# Patient Record
Sex: Female | Born: 1935 | Race: White | Hispanic: No | Smoking: Never smoker
Health system: Southern US, Community
[De-identification: ages and names within clinical notes are randomized; demographics above are authoritative.]

## PROBLEM LIST (undated history)

## (undated) DIAGNOSIS — I672 Cerebral atherosclerosis: Secondary | ICD-10-CM

## (undated) DIAGNOSIS — K219 Gastro-esophageal reflux disease without esophagitis: Secondary | ICD-10-CM

## (undated) DIAGNOSIS — I10 Essential (primary) hypertension: Secondary | ICD-10-CM

## (undated) DIAGNOSIS — I639 Cerebral infarction, unspecified: Secondary | ICD-10-CM

## (undated) DIAGNOSIS — E7800 Pure hypercholesterolemia, unspecified: Secondary | ICD-10-CM

## (undated) DIAGNOSIS — C801 Malignant (primary) neoplasm, unspecified: Secondary | ICD-10-CM

## (undated) DIAGNOSIS — K56609 Unspecified intestinal obstruction, unspecified as to partial versus complete obstruction: Secondary | ICD-10-CM

## (undated) DIAGNOSIS — K5792 Diverticulitis of intestine, part unspecified, without perforation or abscess without bleeding: Secondary | ICD-10-CM

## (undated) DIAGNOSIS — N6009 Solitary cyst of unspecified breast: Secondary | ICD-10-CM

## (undated) DIAGNOSIS — M1990 Unspecified osteoarthritis, unspecified site: Secondary | ICD-10-CM

## (undated) DIAGNOSIS — B029 Zoster without complications: Secondary | ICD-10-CM

## (undated) DIAGNOSIS — D0510 Intraductal carcinoma in situ of unspecified breast: Principal | ICD-10-CM

## (undated) DIAGNOSIS — I519 Heart disease, unspecified: Secondary | ICD-10-CM

## (undated) DIAGNOSIS — E119 Type 2 diabetes mellitus without complications: Secondary | ICD-10-CM

## (undated) DIAGNOSIS — Z853 Personal history of malignant neoplasm of breast: Secondary | ICD-10-CM

## (undated) DIAGNOSIS — I2510 Atherosclerotic heart disease of native coronary artery without angina pectoris: Secondary | ICD-10-CM

## (undated) DIAGNOSIS — G43909 Migraine, unspecified, not intractable, without status migrainosus: Secondary | ICD-10-CM

## (undated) DIAGNOSIS — IMO0002 Reserved for concepts with insufficient information to code with codable children: Secondary | ICD-10-CM

## (undated) HISTORY — DX: Cerebral infarction, unspecified: I63.9

## (undated) HISTORY — DX: Reserved for concepts with insufficient information to code with codable children: IMO0002

## (undated) HISTORY — DX: Personal history of malignant neoplasm of breast: Z85.3

## (undated) HISTORY — DX: Intraductal carcinoma in situ of unspecified breast: D05.10

## (undated) HISTORY — DX: Migraine, unspecified, not intractable, without status migrainosus: G43.909

## (undated) HISTORY — DX: Essential (primary) hypertension: I10

## (undated) HISTORY — DX: Unspecified osteoarthritis, unspecified site: M19.90

## (undated) HISTORY — DX: Heart disease, unspecified: I51.9

## (undated) HISTORY — DX: Atherosclerotic heart disease of native coronary artery without angina pectoris: I25.10

## (undated) HISTORY — DX: Gastro-esophageal reflux disease without esophagitis: K21.9

## (undated) HISTORY — PX: KNEE ARTHROSCOPY: SHX127

## (undated) HISTORY — PX: LAPAROSCOPIC BILATERAL SALPINGO OOPHERECTOMY: SHX5890

## (undated) HISTORY — DX: Unspecified intestinal obstruction, unspecified as to partial versus complete obstruction: K56.609

## (undated) HISTORY — PX: OTHER SURGICAL HISTORY: SHX169

## (undated) HISTORY — DX: Pure hypercholesterolemia, unspecified: E78.00

## (undated) HISTORY — DX: Solitary cyst of unspecified breast: N60.09

## (undated) HISTORY — DX: Zoster without complications: B02.9

## (undated) HISTORY — PX: SPINE SURGERY: SHX786

## (undated) HISTORY — PX: BRAIN SURGERY: SHX531

## (undated) HISTORY — DX: Diverticulitis of intestine, part unspecified, without perforation or abscess without bleeding: K57.92

## (undated) HISTORY — DX: Cerebral atherosclerosis: I67.2

## (undated) HISTORY — PX: CYSTECTOMY: SUR359

---

## 2002-03-22 DIAGNOSIS — I639 Cerebral infarction, unspecified: Secondary | ICD-10-CM

## 2002-03-22 HISTORY — DX: Cerebral infarction, unspecified: I63.9

## 2002-06-04 ENCOUNTER — Encounter: Payer: Self-pay | Admitting: Pediatrics

## 2002-06-04 ENCOUNTER — Emergency Department (HOSPITAL_COMMUNITY): Admission: EM | Admit: 2002-06-04 | Discharge: 2002-06-04 | Payer: Self-pay | Admitting: Family Medicine

## 2002-06-04 ENCOUNTER — Encounter: Payer: Self-pay | Admitting: Emergency Medicine

## 2002-06-04 ENCOUNTER — Inpatient Hospital Stay (HOSPITAL_COMMUNITY): Admission: EM | Admit: 2002-06-04 | Discharge: 2002-06-06 | Payer: Self-pay | Admitting: Emergency Medicine

## 2002-06-11 ENCOUNTER — Encounter (HOSPITAL_COMMUNITY): Admission: RE | Admit: 2002-06-11 | Discharge: 2002-07-11 | Payer: Self-pay | Admitting: Pediatrics

## 2002-06-12 ENCOUNTER — Encounter: Payer: Self-pay | Admitting: Emergency Medicine

## 2002-06-12 ENCOUNTER — Emergency Department (HOSPITAL_COMMUNITY): Admission: EM | Admit: 2002-06-12 | Discharge: 2002-06-12 | Payer: Self-pay | Admitting: Emergency Medicine

## 2002-12-23 ENCOUNTER — Encounter: Payer: Self-pay | Admitting: Emergency Medicine

## 2002-12-23 ENCOUNTER — Emergency Department (HOSPITAL_COMMUNITY): Admission: EM | Admit: 2002-12-23 | Discharge: 2002-12-23 | Payer: Self-pay | Admitting: Emergency Medicine

## 2003-02-18 ENCOUNTER — Ambulatory Visit (HOSPITAL_COMMUNITY): Admission: RE | Admit: 2003-02-18 | Discharge: 2003-02-18 | Payer: Self-pay | Admitting: Ophthalmology

## 2003-04-29 ENCOUNTER — Ambulatory Visit (HOSPITAL_COMMUNITY): Admission: RE | Admit: 2003-04-29 | Discharge: 2003-04-29 | Payer: Self-pay | Admitting: Ophthalmology

## 2003-07-18 ENCOUNTER — Ambulatory Visit (HOSPITAL_COMMUNITY): Admission: RE | Admit: 2003-07-18 | Discharge: 2003-07-18 | Payer: Self-pay | Admitting: Pulmonary Disease

## 2003-08-26 ENCOUNTER — Ambulatory Visit (HOSPITAL_COMMUNITY): Admission: RE | Admit: 2003-08-26 | Discharge: 2003-08-26 | Payer: Self-pay | Admitting: Internal Medicine

## 2004-05-09 ENCOUNTER — Emergency Department (HOSPITAL_COMMUNITY): Admission: EM | Admit: 2004-05-09 | Discharge: 2004-05-09 | Payer: Self-pay | Admitting: Emergency Medicine

## 2005-04-21 ENCOUNTER — Emergency Department (HOSPITAL_COMMUNITY): Admission: EM | Admit: 2005-04-21 | Discharge: 2005-04-21 | Payer: Self-pay | Admitting: Emergency Medicine

## 2005-08-29 ENCOUNTER — Emergency Department (HOSPITAL_COMMUNITY): Admission: EM | Admit: 2005-08-29 | Discharge: 2005-08-29 | Payer: Self-pay | Admitting: Emergency Medicine

## 2007-08-16 ENCOUNTER — Ambulatory Visit (HOSPITAL_COMMUNITY): Admission: RE | Admit: 2007-08-16 | Discharge: 2007-08-16 | Payer: Self-pay | Admitting: Pulmonary Disease

## 2009-01-01 ENCOUNTER — Inpatient Hospital Stay (HOSPITAL_COMMUNITY): Admission: EM | Admit: 2009-01-01 | Discharge: 2009-01-04 | Payer: Self-pay | Admitting: Emergency Medicine

## 2009-03-26 ENCOUNTER — Ambulatory Visit (HOSPITAL_COMMUNITY): Admission: RE | Admit: 2009-03-26 | Discharge: 2009-03-26 | Payer: Self-pay | Admitting: Pulmonary Disease

## 2010-03-22 DIAGNOSIS — D0510 Intraductal carcinoma in situ of unspecified breast: Secondary | ICD-10-CM

## 2010-03-22 HISTORY — DX: Intraductal carcinoma in situ of unspecified breast: D05.10

## 2010-06-25 LAB — DIFFERENTIAL
Basophils Absolute: 0 10*3/uL (ref 0.0–0.1)
Basophils Relative: 0 % (ref 0–1)
Eosinophils Absolute: 0 10*3/uL (ref 0.0–0.7)
Eosinophils Relative: 0 % (ref 0–5)
Lymphocytes Relative: 5 % — ABNORMAL LOW (ref 12–46)
Monocytes Relative: 8 % (ref 3–12)

## 2010-06-25 LAB — URINE CULTURE: Colony Count: 25000

## 2010-06-25 LAB — URINALYSIS, ROUTINE W REFLEX MICROSCOPIC
Bilirubin Urine: NEGATIVE
Glucose, UA: NEGATIVE mg/dL
Nitrite: NEGATIVE
Specific Gravity, Urine: 1.02 (ref 1.005–1.030)
Urobilinogen, UA: 0.2 mg/dL (ref 0.0–1.0)

## 2010-06-25 LAB — CBC
HCT: 40.4 % (ref 36.0–46.0)
Hemoglobin: 14 g/dL (ref 12.0–15.0)
MCHC: 34.6 g/dL (ref 30.0–36.0)
RBC: 4.78 MIL/uL (ref 3.87–5.11)
RDW: 14 % (ref 11.5–15.5)
WBC: 15.1 10*3/uL — ABNORMAL HIGH (ref 4.0–10.5)

## 2010-06-25 LAB — COMPREHENSIVE METABOLIC PANEL
ALT: 22 U/L (ref 0–35)
AST: 33 U/L (ref 0–37)
Albumin: 4 g/dL (ref 3.5–5.2)
Alkaline Phosphatase: 84 U/L (ref 39–117)
BUN: 23 mg/dL (ref 6–23)
Calcium: 9.3 mg/dL (ref 8.4–10.5)
Potassium: 3.8 mEq/L (ref 3.5–5.1)
Total Bilirubin: 1.2 mg/dL (ref 0.3–1.2)
Total Protein: 6.5 g/dL (ref 6.0–8.3)

## 2010-06-25 LAB — LIPASE, BLOOD: Lipase: 16 U/L (ref 11–59)

## 2010-06-25 LAB — POCT CARDIAC MARKERS: Troponin i, poc: <0.05 ng/mL (ref 0.00–0.09)

## 2010-08-05 ENCOUNTER — Other Ambulatory Visit (HOSPITAL_COMMUNITY): Payer: Self-pay | Admitting: Pulmonary Disease

## 2010-08-05 DIAGNOSIS — N6452 Nipple discharge: Secondary | ICD-10-CM

## 2010-08-07 NOTE — Discharge Summary (Signed)
Caroline Aguirre, Caroline Aguirre                        ACCOUNT NO.:  [id number]   MEDICAL RECORD NO.:  [id number]                   PATIENT TYPE:  INP   LOCATION:  3025                                 FACILITY:  [facility]   PHYSICIAN:  Deanna Artis. Sharene Skeans, M.D.           DATE OF BIRTH:  [birth year]   DATE OF ADMISSION:  06/04/2002  DATE OF DISCHARGE:  06/06/2002                                 DISCHARGE SUMMARY   FINAL DIAGNOSES:  1. 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  2. Hypertension, initially in poor control, now well controlled, 404.10.  3. Residual stroke involving left partial hemianopia, more in the inferior     quadrants than the superior quadrant.  4. Dyslipidemia.  5. Glaucoma.   PROCEDURE:  CT, MRI, 2-D echocardiogram, carotid Doppler.   COMPLICATIONS:  None.   HOSPITAL COURSE:  The patient was admitted. CT scan at [facility] showed  evidence of hemorrhage that was 4.2 cm x 2.5 cm x 3.5 cm with some  surrounding edema. MRI of the brain again showed the same lesion with a  metabolized hematoma undergoing metabolic change and a surround of vasogenic  edema that was small and was not associated with significant mass effect.   The MRA showed a hypoplastic left segment, no occlusions within the  intracranial vascular were noted. Carotid Doppler study was normal and did  not show hemodynamically significant stenosis. Two-D echocardiogram is  pending at the time of this dictation; however, the patient has shown a  regular sinus rhythm in the hospital and likelihood of cardiac source  embolus is small. The EKG suggested possible anterior infarct of  undetermined age. The patient has not show signs of progression of her  symptoms since that time.   Her laboratory studies include cholesterol of 152, triglycerides 99, HDL  cholesterol slightly low at 38, VLDL cholesterol 20, LDL cholesterol 94.  Prothrombin time 13.4, INR 1.0. Sodium 142,  potassium 3.9, chloride 108, CO2  28, glucose 137, BUN 12, creatinine 0.7, calcium 8.5. Total protein 6.3,  albumin 3.5, albumin 3.5, AST 27, ALT 23, ALP 68, total bilirubin 0.8. Serum  homocysteine is pending at this time.   PHYSICAL EXAMINATION:  VITAL SIGNS:  On examination today, blood pressure  172/77, resting pulse 66, resting respiration 20, pulse oximetry 95%,  temperature 98.6.  HEAD, EYES, EARS, NOSE AND THROAT:  No signs of infection.  NECK:  Supple.  LUNGS:  Clear.  HEART:  No murmurs. Pulses normal.  ABDOMEN:  Soft, nontender, bowel sounds normal.  EXTREMITIES:  Well formed.  NEUROLOGICAL:  The patient has a rather dense left inferior quadrant and  partial left superior quadrant. She has decreased vision from her glaucoma.  Pupils are sluggishly reactive. Fundi are hard to see. The patient has no  drift, normal strength, good fine motor movements, normal sensation. Gait is  slightly broad based, a bit  unsteady, but she does not fall. Deep tendon  reflexes are symmetric and diminished. The patient has bilateral extensor  plantar responses.   DISPOSITION:  The patient is discharged in improved condition. She continues  to have a headache for which she can take Tylenol. We will have her seen by  the occupational therapist one more time to be certain that there is no need  for outpatient OT. OT, PT, and speech therapy have seen her and feel that  she has no needs at this time. She will be followed up in our office in one  month's time, sooner depending upon clinical needs.                                               Deanna Artis. Sharene Skeans, M.D.    [facility]  D:  06/06/2002  T:  06/07/2002  Job:  [id number]   cc:   Ramon Dredge L. Juanetta Gosling, M.D.  [address]  [place]  [place] [address]  Fax: [phone]

## 2010-08-07 NOTE — Consult Note (Signed)
Caroline Aguirre, Caroline Aguirre                        ACCOUNT NO.:  [id number]   MEDICAL RECORD NO.:  [id number]                   PATIENT TYPE:  INP   LOCATION:  3025                                 FACILITY:  [facility]   PHYSICIAN:  Deanna Artis. Sharene Skeans, M.D.           DATE OF BIRTH:  1924/05/25   DATE OF CONSULTATION:  06/04/2002  DATE OF DISCHARGE:                                   CONSULTATION   CHIEF COMPLAINT:  Headache of less than one day's duration, progressive,  involving the front of the head and the right neck.   HISTORY OF PRESENT ILLNESS:  The patient is a 77 year old right-handed  Caucasian divorced woman with past history of hypertension, dyslipidemia,  glaucoma, and cataracts who has had a one-week history of malaise and  generalized myalgias for which she takes Feldene.   The patient had onset of headaches around 2 p.m. while she was fixing her  son's car.  The headache came on gradually and progressed over the course of  the night to involve very severe headache involving the frontal portion of  her head radiating down to the right side of her posterior head region and  neck.  This was associated with blurred vision and nausea.  The patient did  not have other symptoms of weakness, language disorder, sensory changes.   The patient was brought to [facility] where she was assessed by Dr.  Greta Doom.  CT scan of the brain showed a 4.2 cm x 2.5 cm x 3.5 cm  hematoma in the right temporoparietal region posterior division of the  middle cerebral artery.  There was some edema surrounding the lesion.  There  was no clearcut blood in the right occipital horn; however, it appeared to  be obliterated by the bleed.  It could be seen on several levels extending  up to the parietal region sparing the occipital cortex.   I was asked to take the patient in transfer for further evaluation and  treatment.  The patient's blood pressure on presentation was 205/72 and  dropped to  a more normal region after morphine 8 mg and clonidine 0.1 mg was  given.   PAST MEDICAL HISTORY:  Positive for sinusitis and lumbar spondylosis.   PAST SURGICAL HISTORY:  1. Lumbar laminectomy.  2. Hysterectomy.  3. Esophageal dilatation secondary to stricture.   REVIEW OF SYSTEMS:  Positive for paroxysmal nocturnal dyspnea, dysphagia,  urinary frequency, generalized weakness.  A 12-system review is without  negative.   CURRENT MEDICATIONS:  1. Lopressor 50 mg per day.  2. Aspirin 81 mg per day.  3. Tums one per day.  4. Vitamin D one per day.  5. Xalatan one drop in each eye daily.  6. P.r.n. Feldene and Tylenol.   DRUG ALLERGIES:  CODEINE.   FAMILY HISTORY:  Mother died of heart and kidney failure.  Father died of  cancer.  One of her children  died of reasons unknown.  A second is bipolar  and lives with her.   SOCIAL HISTORY:  The patient lives in [place] with her son.  She is divorced,  she drives.  She is independent in her activities of daily living.  She has  had no use of tobacco or alcohol.   PHYSICAL EXAMINATION TODAY:  GENERAL:  Pleasant woman, no acute distress,  sitting up on her stretcher.  She is right-handed.  VITAL SIGNS:  Temperature 97, blood pressure 158/71, resting pulse 51,  respirations 20, pulse oximetry 99% on 2 liters of oxygen.  The patient is  somewhat sleepy but easily arousable.  HEENT:  Supple neck.  No infection, no tenderness, no bruits.  LUNGS:  Clear.  HEART:  No murmurs, pulse normal.  ABDOMEN:  Soft, bowel sounds normal.  No hepatosplenomegaly.  EXTREMITIES:  No edema, cyanosis, or altered tone.  NEUROLOGIC:  Mental status:  The patient is awake, alert.  No dysphagia,  dyspraxia, dysmetria.  She has normal fund of knowledge and memory.  Cranial  nerves:  Round reactive pupils, 2.5 down to 2.  I cannot see in her right  due to a cataract.  The left shows mild optic atrophy.  She has a sharp disk  margin.  Visual fields show left  superior quadrantanopsia.  She cannot  follow directions to test double simultaneous stimuli.  She had symmetric  facial strength, midline tongue.  Motor examination:  Normal strength, tone,  and mass.  Good fine motor movements.  No drift.  Sensory examination:  Withdrawal x4.  Deep tendon reflexes are diminished.  The patient had  bilateral extensor plantar responses.   IMPRESSION:  1. Posterior temporoparietal water shed primary hemorrhage, 431, versus an     ischemic stroke with secondary transformation.  I suspect amyloid     angiopathy.  2. Hypertension, 404.10.  3. Dyslipidemia.  4. Glaucoma.   PLAN:  1. MRI/MRA.  2. A 2-D echocardiogram.  3. Carotid Doppler.  4. PT and OT.  5. Stop aspirin for now and also hold Lopressor for now.   I appreciate the opportunity to participate in her care.                                               Deanna Artis. Sharene Skeans, M.D.   [facility]  D:  06/04/2002  T:  06/04/2002  Job:  [id number]   cc:   Ramon Dredge L. Juanetta Gosling, M.D.  [address]  [place]  [place] [address]  Fax: [phone]

## 2010-08-07 NOTE — Op Note (Signed)
NAMENATALYE, KOTT                        ACCOUNT NO.:  [id number]   MEDICAL RECORD NO.:  [id number]                   PATIENT TYPE:  AMB   LOCATION:  DAY                                  FACILITY:  [facility]   PHYSICIAN:  Lionel December, M.D.                 DATE OF BIRTH:  01/23/1925   DATE OF PROCEDURE:  08/26/2003  DATE OF DISCHARGE:                                 OPERATIVE REPORT   ADDENDUM:  Mrs. Schul agreed to have a barium enema.  She did well.  I  reviewed the films with Dr. __________.  She has a very tortuous sigmoid  colon with tight bands.  She has multiple diverticula with short segment  which is spastic.  Her cecum and terminal ileum are well visualized and are  normal.  I reviewed the results with the patient.  She will go on high-fiber  diet and Citrucel one tablespoonful daily.  She does not need any further  work-up unless her symptoms were to relapse.      ___________________________________________                                            Lionel December, M.D.   NR/MEDQ  D:  08/26/2003  T:  08/26/2003  Job:  [id number]   cc:   Ramon Dredge L. Juanetta Gosling, M.D.  [address]  [place]  [place] [address]  Fax: [phone]

## 2010-08-07 NOTE — Op Note (Signed)
NAME:  Caroline Aguirre, Caroline Aguirre                        ACCOUNT NO.:  [id number]   MEDICAL RECORD NO.:  [id number]                   PATIENT TYPE:  AMB   LOCATION:  DAY                                  FACILITY:  [facility]   PHYSICIAN:  Lionel December, M.D.                 DATE OF BIRTH:  1924-11-19   DATE OF PROCEDURE:  DATE OF DISCHARGE:                                 OPERATIVE REPORT   PROCEDURE:  Esophagogastroduodenoscopy with esophageal dilatation followed  by total colonoscopy which is incomplete.   ENDOSCOPIST:  Lionel December, M.D.   INDICATIONS:  Caroline Aguirre is a 77 year old Caucasian female who has chronic GERD  and complaining of dysphagia.  Her last EGD/ED was back in 1998. The patient  reports that she did not do well with conscious sedation and, therefore,  wanted [place] to give her __________ anesthesia.  She was recently treated for  diverticulitis. She had a CT about 5-6 weeks ago which showed thickening to  the sigmoid colon with diverticula as well as terminal ileum.  She is  feeling better with antibiotic therapy but she still has some discomfort in  the right lower quadrant and, therefore, undergoing a diagnostic colonoscopy  with an ileoscopy.  The procedure and risks were reviewed with the patient  and informed consent was obtained.  The patient requested that she be given  anesthesia, __________ , as she did not do well with her EGD back in 1998  when her esophagus was dilated.   PREOPERATIVE MEDICATIONS:  Please see anesthesia records for details.   FINDINGS:  Procedure is performed in the OR.  The patient was placed under  anesthesia, intubated, and positioned in the left lateral recumbent  position.   PROCEDURE #1 ESOPHAGOGASTRODUODENOSCOPY:  Olympus videoscope was passed via  the oropharynx without any difficulty into the esophagus.   ESOPHAGUS:  She had a ring at the GE junction, but hernia was not obvious.  She has known sliding hiatal hernia.   STOMACH:  It was empty  and distended very well with insufflation.  Folds in  the proximal stomach were normal.  Examination of the mucosa at body,  antrum, pyloric channel, as well as angularis was normal.  There was a 5-mm,  submucosal swelling at the fundus, possibly a leiomyoma, lipoma was left  along.   DUODENUM:  Examination of the bulb and second part of the duodenum was  normal.  Endoscope was withdrawn.   Esophagus was dilated by passing 54 and 56 [place] Maloney dilators.  As the  dilator was withdrawn the endoscope was passed again; there was a small  linear tear at the GE junction as well as at the cervical esophagus.  The  proximal tear indicated a small web not seen initially.   Endoscope was withdrawn and the patient was prepared for colonoscopy.   PROCEDURE #2 COLONOSCOPY:  Rectal examination performed.  No abnormality  noted on external  or digital exam.   Olympus videoscope was placed in the rectum and advanced under vision into  the sigmoid colon which was very tortuous with multiple diverticula.  Mucosa, however, was normal.  In mid-to-proximal sigmoid colon I was not  able to find the lumen because of very sharp turn.  Because of multiple  diverticula I did not advance the scope blindly.  The patient was  subsequently placed in supine position without any advantage.  As the scope  was withdrawn, the mucosa of the sigmoid colon was, once again, carefully  examined and was normal.  The rectal mucosa similarly was normal.   The scope was retroflexed to examine anorectal junction which was  unremarkable.  The endoscope was withdrawn.  The patient was extubated and  brought to PACU in stable condition.   FINAL DIAGNOSES:  1. Distal esophageal ring.  2. Small submucosal nodule at fundus, possibly a lipoma or leiomyoma.  3. No evidence of peptic ulcer disease.  4. Esophagus dilated by passing 54 and 56 [place] Maloney dilators resulting     in a small tear at the cervical esophagus indicating  a web along with     disruption of ring at gastroesophageal junction.  5. Incomplete colonoscopy secondary to very tortuous sigmoid colon with     multiple diverticula.   RECOMMENDATIONS:  1. She will continue antireflux measures as before.  2. We will start her on Prilosec 20 mg p.o. q.a.m.  3. She will return for barium enema within the next couple of days or a few     weeks.      ___________________________________________                                            Lionel December, M.D.   NR/MEDQ  D:  08/26/2003  T:  08/26/2003  Job:  [id number]   cc:   Ramon Dredge L. Juanetta Gosling, M.D.  [address]  [place]  [place] [address]  Fax: [phone]

## 2010-08-07 NOTE — H&P (Signed)
Caroline Aguirre                        ACCOUNT NO.:  [id number]   MEDICAL RECORD NO.:  [id number]                  PATIENT TYPE:   LOCATION:                                       FACILITY:  [facility]   PHYSICIAN:  Lionel December, M.D.                 DATE OF BIRTH:  Feb 13, 1925   DATE OF ADMISSION:  DATE OF DISCHARGE:                                HISTORY & PHYSICAL   CHIEF COMPLAINT:  Diverticulosis.   HISTORY OF PRESENT ILLNESS:  The patient was referred by Dr. Juanetta Gosling for a  flare up of the patient's diverticulosis.  The patient brings with her a  copy of the CT of the abdomen and pelvis with contrast.  The mucosa of the  distal and terminal ileum over a fairly lengthy segment measures between 4  and 5 mm in thickness which is suggestive of inflammatory bowel disease,  possibly Crohn's disease.  In addition, there is some thickening of the  mucosa of the sigmoid colon and there are multiple diverticula present and  adjacent thickening of the fascia and some pericolic stranding is suggestive  of a mild sigmoid diverticulitis.  The patient was last seen in this clinic  on April 04, 1997 by Dr. Karilyn Cota who was treating the patient for  intermittent lower abdominal pain.  The patient was diagnosed as having  irritable bowel syndrome as well as reflux esophagitis.  The patient was  placed on Citrucel, Levsin, and Zantac 150 mg p.o. b.i.d.  The patient has  not continued on these medications.  The patient states that beginning  approximately 8-10 weeks ago she began to experience bloating, gas, and pain  in the lower abdomen.  The patient was seen by Dr. Juanetta Gosling who ordered the  CT of the abdomen.  The patient was also started on Cipro 500 mg b.i.d. and  Flagyl 500 mg t.i.d.  The patient reports some improvement but states that  her abdominal pain is now centered in the right lower quadrant.  The patient  reports having three to four soft formed stools a day which is increased  from  her normal one to two stools a day.  She reports no hematochezia or  melena.  Regarding her esophageal reflux disease the patient states that it  is resolved to the point where she is troubled only by belching.  She does  report some swallowing difficulties to solids which appear to stick in her  mid chest.  She reports no hematemesis.  The patient had an EGD on Aug 06, 1996 with a diagnosis of tight distal esophageal ring above a small sliding  hiatal hernia along with a single erosion at the distal esophagus.  The  esophageal ring was obliterated by passing a 54 [place] Maloney dilator.  It  should be noted that the patient states that she was awake and remembered  the entire procedure and reports that it  was very frightening and disturbing  to her and she has been reluctant to seek medical care because of this  experience.  This will be discussed with Dr. Karilyn Cota.  Since her last visit  to this clinic the patient reports having a CVA with hemorrhage in March  2004 for which she takes Plavix.  The patient denies the use of any  nonsteroidal antiinflammatories.   CURRENT MEDICATIONS:  1. Lopressor 50 mg b.i.d.  2. Norvasc 5 mg daily.  3. Micardis 80/12.5 mg daily.  4. Plavix 75 mg daily.  5. Megavitamins one daily.  6. Tums b.i.d. for the calcium rather than for GERD.  7. Tylenol p.r.n.  8. Caduet 5/10 mg daily for angina.   PAST MEDICAL HISTORY:  Significant for:  1. GERD.  2. Hypertension.  3. History of irritable bowel syndrome.  4. History of diverticulosis with flares of diverticulitis.  5. Osteoporosis.  6. CVA in March 2004.   SURGERIES:  1. Cyst on the left breast.  2. Hysterectomy for fibroid tumor.  3. Disc surgery for her back.   FAMILY HISTORY:  Mother is deceased, had type 2 diabetes and an MI.  Father  is deceased.  He had heart disease and cancer of the lungs.  She has one  sister in good health.   SOCIAL HISTORY:  The patient is divorced, she is retired.   She denies use of  alcohol, tobacco, or recreational drugs.   PHYSICAL EXAMINATION:  VITAL SIGNS:  Weight 154.5, temperature 97.4, blood  pressure 132/62, pulse is 60.  HEENT:  Normocephalic, atraumatic.  The sclerae are clear and nonicteric.  The conjunctivae are pale.  NECK:  Supple without masses or thyromegaly.  LUNGS:  Clear to auscultation bilaterally.  HEART:  Regular rate and rhythm.  No murmurs, rubs, or gallops.  SKIN:  Warm, dry, and without jaundice.  EXTREMITIES:  Trace edema of the lower extremities.  ABDOMEN:  Soft, protuberant.  There are bowel sounds present in all four  quadrants.  There is tenderness to palpation over the right lower quadrant.  No masses or hepatosplenomegaly were palpated.   IMPRESSION:  A 77 year old Caucasian female with history of diverticulosis  having recent diverticulitis.  CT of the abdomen indicates inflammatory  bowel disease, possibly Crohn's of the distal and terminal ileum.  The  patient still reports symptoms of GERD which is untreated at this time.  The  patient had EGD in May 1998 which showed esophageal ring which was dilated.  The patient had a small hiatal hernia and a small erosion of the distal  esophagus.  The patient reports a bad experience and requests heavier  sedation if it is necessary to repeat the procedure.  In view of the  patient's dysphagia to solids as well as CT findings of possible Crohn's  disease and recent flare of diverticulitis, an EGD as well as a colonoscopy  is recommended.   RECOMMENDATIONS:  Dr. Karilyn Cota was consulted and suggests that the patient be  scheduled for a an EGD and colonoscopy next week in the emergency room.  The  patient was reassured that adequate sedation would be used to  keep her comfortable.  Laboratory studies including a Chem 20, CBC, and sed  rate were ordered prior to the procedure.  Further recommendations to  follow.  Thank you for allowing [place] to participate in the care of  this patient.     _____________________________________  ___________________________________________  Ashok Pall, PA  Lionel December, M.D.   GC/MEDQ  D:  08/23/2003  T:  08/23/2003  Job:  [id number]   cc:   Ramon Dredge L. Juanetta Gosling, M.D.  [address]  [place]  [place] [address]  Fax: [phone]

## 2010-08-19 ENCOUNTER — Other Ambulatory Visit (HOSPITAL_COMMUNITY): Payer: Self-pay | Admitting: Pulmonary Disease

## 2010-08-19 ENCOUNTER — Ambulatory Visit (HOSPITAL_COMMUNITY)
Admission: RE | Admit: 2010-08-19 | Discharge: 2010-08-19 | Disposition: A | Discharge status: Home / Self Care | Payer: Medicare Other | Source: Ambulatory Visit | Attending: Pulmonary Disease | Admitting: Pulmonary Disease

## 2010-08-19 ENCOUNTER — Other Ambulatory Visit: Payer: Self-pay | Admitting: Diagnostic Radiology

## 2010-08-19 ENCOUNTER — Other Ambulatory Visit: Payer: Self-pay | Admitting: Pulmonary Disease

## 2010-08-19 DIAGNOSIS — N63 Unspecified lump in unspecified breast: Secondary | ICD-10-CM

## 2010-08-19 DIAGNOSIS — C50919 Malignant neoplasm of unspecified site of unspecified female breast: Secondary | ICD-10-CM | POA: Insufficient documentation

## 2010-08-19 DIAGNOSIS — Z853 Personal history of malignant neoplasm of breast: Secondary | ICD-10-CM

## 2010-08-19 DIAGNOSIS — N6452 Nipple discharge: Secondary | ICD-10-CM

## 2010-08-19 DIAGNOSIS — R921 Mammographic calcification found on diagnostic imaging of breast: Secondary | ICD-10-CM

## 2010-08-19 HISTORY — DX: Personal history of malignant neoplasm of breast: Z85.3

## 2010-08-21 ENCOUNTER — Other Ambulatory Visit: Payer: Self-pay | Admitting: Pulmonary Disease

## 2010-08-21 DIAGNOSIS — C50911 Malignant neoplasm of unspecified site of right female breast: Secondary | ICD-10-CM

## 2010-08-25 ENCOUNTER — Ambulatory Visit
Admission: RE | Admit: 2010-08-25 | Discharge: 2010-08-25 | Disposition: A | Discharge status: Home / Self Care | Payer: Medicare Other | Source: Ambulatory Visit | Attending: Pulmonary Disease | Admitting: Pulmonary Disease

## 2010-08-25 ENCOUNTER — Other Ambulatory Visit: Payer: Self-pay | Admitting: Diagnostic Radiology

## 2010-08-25 DIAGNOSIS — R921 Mammographic calcification found on diagnostic imaging of breast: Secondary | ICD-10-CM

## 2010-08-26 ENCOUNTER — Ambulatory Visit (HOSPITAL_COMMUNITY)
Admission: RE | Admit: 2010-08-26 | Discharge: 2010-08-26 | Disposition: A | Discharge status: Home / Self Care | Payer: Medicare Other | Source: Ambulatory Visit | Attending: Pulmonary Disease | Admitting: Pulmonary Disease

## 2010-08-26 DIAGNOSIS — C50911 Malignant neoplasm of unspecified site of right female breast: Secondary | ICD-10-CM

## 2010-08-26 DIAGNOSIS — C50919 Malignant neoplasm of unspecified site of unspecified female breast: Secondary | ICD-10-CM | POA: Insufficient documentation

## 2010-08-26 LAB — CREATININE, SERUM
GFR calc Af Amer: >60 mL/min (ref >60)
GFR calc non Af Amer: >60 mL/min (ref >60)

## 2010-08-26 MED ORDER — GADOBENATE DIMEGLUMINE 529 MG/ML IV SOLN: 15.0000 mL | Freq: Once | INTRAVENOUS | Status: AC | PRN

## 2010-09-07 ENCOUNTER — Encounter (INDEPENDENT_AMBULATORY_CARE_PROVIDER_SITE_OTHER): Payer: Self-pay | Admitting: Surgery

## 2010-09-07 DIAGNOSIS — Z973 Presence of spectacles and contact lenses: Secondary | ICD-10-CM | POA: Insufficient documentation

## 2010-09-07 DIAGNOSIS — N6452 Nipple discharge: Secondary | ICD-10-CM

## 2010-09-07 DIAGNOSIS — I10 Essential (primary) hypertension: Secondary | ICD-10-CM | POA: Insufficient documentation

## 2010-09-07 DIAGNOSIS — R5383 Other fatigue: Secondary | ICD-10-CM

## 2010-09-07 DIAGNOSIS — I672 Cerebral atherosclerosis: Secondary | ICD-10-CM | POA: Insufficient documentation

## 2010-09-07 DIAGNOSIS — IMO0002 Reserved for concepts with insufficient information to code with codable children: Secondary | ICD-10-CM | POA: Insufficient documentation

## 2010-09-07 DIAGNOSIS — H9190 Unspecified hearing loss, unspecified ear: Secondary | ICD-10-CM | POA: Insufficient documentation

## 2010-09-07 DIAGNOSIS — B029 Zoster without complications: Secondary | ICD-10-CM | POA: Insufficient documentation

## 2010-09-07 DIAGNOSIS — N6001 Solitary cyst of right breast: Secondary | ICD-10-CM | POA: Insufficient documentation

## 2010-09-07 DIAGNOSIS — J40 Bronchitis, not specified as acute or chronic: Secondary | ICD-10-CM | POA: Insufficient documentation

## 2010-09-07 DIAGNOSIS — N63 Unspecified lump in unspecified breast: Secondary | ICD-10-CM

## 2010-09-07 DIAGNOSIS — H409 Unspecified glaucoma: Secondary | ICD-10-CM | POA: Insufficient documentation

## 2010-09-07 DIAGNOSIS — K219 Gastro-esophageal reflux disease without esophagitis: Secondary | ICD-10-CM | POA: Insufficient documentation

## 2010-09-07 DIAGNOSIS — J45909 Unspecified asthma, uncomplicated: Secondary | ICD-10-CM | POA: Insufficient documentation

## 2010-09-07 DIAGNOSIS — Z780 Asymptomatic menopausal state: Secondary | ICD-10-CM | POA: Insufficient documentation

## 2010-09-07 DIAGNOSIS — I639 Cerebral infarction, unspecified: Secondary | ICD-10-CM | POA: Insufficient documentation

## 2010-09-07 DIAGNOSIS — D229 Melanocytic nevi, unspecified: Secondary | ICD-10-CM | POA: Insufficient documentation

## 2010-09-11 ENCOUNTER — Encounter (HOSPITAL_COMMUNITY): Payer: Medicare Other | Attending: Oncology

## 2010-09-11 DIAGNOSIS — D0590 Unspecified type of carcinoma in situ of unspecified breast: Secondary | ICD-10-CM

## 2010-09-17 ENCOUNTER — Encounter (INDEPENDENT_AMBULATORY_CARE_PROVIDER_SITE_OTHER): Payer: Self-pay | Admitting: Surgery

## 2010-09-18 ENCOUNTER — Ambulatory Visit (INDEPENDENT_AMBULATORY_CARE_PROVIDER_SITE_OTHER): Payer: Medicare Other | Admitting: Surgery

## 2010-09-18 ENCOUNTER — Encounter (INDEPENDENT_AMBULATORY_CARE_PROVIDER_SITE_OTHER): Payer: Self-pay | Admitting: Surgery

## 2010-09-18 VITALS — BP 150/62 | HR 50 | Temp 96.8°F | Ht 61.0 in | Wt 141.0 lb

## 2010-09-18 DIAGNOSIS — D0590 Unspecified type of carcinoma in situ of unspecified breast: Secondary | ICD-10-CM

## 2010-09-18 NOTE — Progress Notes (Signed)
Chief complaint: Breast cancer  History of present illness I saw this patient earlier in the month and she had been diagnosed with a large area of DCIS in the right breast. We had discussed potential mastectomy, but she was very reluctant to have surgery. We obtained an oncologic opinion and they felt that surgery would be her best option. She was receptor negative.  She has also spoken with her primary care physician Dr. Juanetta Gosling. He also recommended that we proceed to surgery.  PFSH: There have been no changes since I saw her a few weeks ago.  PE: Gen.: Patient is alert oriented and in no distress. She appears somewhat sad. Lungs: Lungs are clear to auscultation. Respirations are normal. Heart: Regular rhythm with no murmurs rubs or gallops. Breasts: The right breast seemed smaller than the left with some diffuse thickening and irregularity throughout. There is frank clear nipple discharge on the right. The left breast appears normal. Abdomen: Soft and nontender. No masses or organomegaly noted. Data reviewed: I reviewed over the nose that Dr. Welton Flakes sent over. Since the patient's cancer was receptor negative, the patient was recommended to proceed to mastectomy.  Impression: Clinical stage 0 extensive right breast cancer  Plan: I think she needs to proceed to mastectomy. I had a long talk with the patient and her son about this. She is agreeable to proceed. We went over all of her questions. She understands that she will be kept in the hospital until she is ready to go home. She also she will have drains in place balloon schedule this at her convenience but there is no rush since most likely she has had cancer for several years.

## 2010-09-18 NOTE — Patient Instructions (Signed)
We will schedule you for surgery - total mastectomy. We will keep you in the hospital after surgery.  You will need to stop your plavix five days before surgery

## 2010-09-21 ENCOUNTER — Other Ambulatory Visit (INDEPENDENT_AMBULATORY_CARE_PROVIDER_SITE_OTHER): Payer: Self-pay | Admitting: Surgery

## 2010-09-21 ENCOUNTER — Ambulatory Visit (HOSPITAL_COMMUNITY)
Admission: RE | Admit: 2010-09-21 | Discharge: 2010-09-21 | Disposition: A | Discharge status: Home / Self Care | Payer: Medicare Other | Source: Ambulatory Visit | Attending: Surgery | Admitting: Surgery

## 2010-09-21 ENCOUNTER — Encounter (HOSPITAL_COMMUNITY): Payer: Medicare Other

## 2010-09-21 DIAGNOSIS — Z01810 Encounter for preprocedural cardiovascular examination: Secondary | ICD-10-CM | POA: Insufficient documentation

## 2010-09-21 DIAGNOSIS — Z01812 Encounter for preprocedural laboratory examination: Secondary | ICD-10-CM | POA: Insufficient documentation

## 2010-09-21 DIAGNOSIS — Z01818 Encounter for other preprocedural examination: Secondary | ICD-10-CM | POA: Insufficient documentation

## 2010-09-21 DIAGNOSIS — C50919 Malignant neoplasm of unspecified site of unspecified female breast: Secondary | ICD-10-CM | POA: Insufficient documentation

## 2010-09-21 DIAGNOSIS — I10 Essential (primary) hypertension: Secondary | ICD-10-CM | POA: Insufficient documentation

## 2010-09-21 LAB — DIFFERENTIAL
Eosinophils Relative: 1 % (ref 0–5)
Lymphocytes Relative: 25 % (ref 12–46)
Monocytes Absolute: 1 10*3/uL (ref 0.1–1.0)
Monocytes Relative: 11 % (ref 3–12)
Neutro Abs: 5.8 10*3/uL (ref 1.7–7.7)

## 2010-09-21 LAB — URINALYSIS, ROUTINE W REFLEX MICROSCOPIC
Ketones, ur: NEGATIVE mg/dL
Nitrite: NEGATIVE
Protein, ur: NEGATIVE mg/dL
pH: 6 (ref 5.0–8.0)

## 2010-09-21 LAB — COMPREHENSIVE METABOLIC PANEL
Alkaline Phosphatase: 101 U/L (ref 39–117)
BUN: 18 mg/dL (ref 6–23)
Calcium: 10.4 mg/dL (ref 8.4–10.5)
Creatinine, Ser: 0.81 mg/dL (ref 0.50–1.10)
GFR calc Af Amer: >60 mL/min (ref >60)
Glucose, Bld: 101 mg/dL — ABNORMAL HIGH (ref 70–99)
Total Protein: 7.3 g/dL (ref 6.0–8.3)

## 2010-09-21 LAB — URINE MICROSCOPIC-ADD ON

## 2010-09-21 LAB — SURGICAL PCR SCREEN
MRSA, PCR: NEGATIVE
Staphylococcus aureus: NEGATIVE

## 2010-09-21 LAB — CBC
HCT: 43.4 % (ref 36.0–46.0)
Hemoglobin: 14.2 g/dL (ref 12.0–15.0)
MCH: 27.1 pg (ref 26.0–34.0)
MCHC: 32.7 g/dL (ref 30.0–36.0)
MCV: 82.8 fL (ref 78.0–100.0)

## 2010-09-25 ENCOUNTER — Ambulatory Visit (HOSPITAL_COMMUNITY)
Admission: RE | Admit: 2010-09-25 | Discharge: 2010-09-27 | Disposition: A | Discharge status: Home / Self Care | Payer: Medicare Other | Source: Ambulatory Visit | Attending: Surgery | Admitting: Surgery

## 2010-09-25 ENCOUNTER — Other Ambulatory Visit (INDEPENDENT_AMBULATORY_CARE_PROVIDER_SITE_OTHER): Payer: Self-pay | Admitting: Surgery

## 2010-09-25 DIAGNOSIS — J4489 Other specified chronic obstructive pulmonary disease: Secondary | ICD-10-CM | POA: Insufficient documentation

## 2010-09-25 DIAGNOSIS — D0590 Unspecified type of carcinoma in situ of unspecified breast: Principal | ICD-10-CM | POA: Insufficient documentation

## 2010-09-25 DIAGNOSIS — L299 Pruritus, unspecified: Secondary | ICD-10-CM | POA: Insufficient documentation

## 2010-09-25 DIAGNOSIS — Z01810 Encounter for preprocedural cardiovascular examination: Secondary | ICD-10-CM | POA: Insufficient documentation

## 2010-09-25 DIAGNOSIS — H539 Unspecified visual disturbance: Secondary | ICD-10-CM | POA: Insufficient documentation

## 2010-09-25 DIAGNOSIS — J449 Chronic obstructive pulmonary disease, unspecified: Secondary | ICD-10-CM | POA: Insufficient documentation

## 2010-09-25 DIAGNOSIS — I69998 Other sequelae following unspecified cerebrovascular disease: Secondary | ICD-10-CM | POA: Insufficient documentation

## 2010-09-25 DIAGNOSIS — I10 Essential (primary) hypertension: Secondary | ICD-10-CM | POA: Insufficient documentation

## 2010-09-25 DIAGNOSIS — Z01812 Encounter for preprocedural laboratory examination: Secondary | ICD-10-CM | POA: Insufficient documentation

## 2010-09-25 DIAGNOSIS — I498 Other specified cardiac arrhythmias: Secondary | ICD-10-CM | POA: Insufficient documentation

## 2010-09-25 HISTORY — PX: BREAST SURGERY: SHX581

## 2010-09-28 NOTE — Op Note (Signed)
  NAMEKENNIDEE, HEYNE NO.:  [id number]  MEDICAL RECORD NO.:  [id number]  LOCATION:  1539                         FACILITY:  [facility]  PHYSICIAN:  Currie Paris, M.D.DATE OF BIRTH:  [birth year]  DATE OF PROCEDURE:  09/25/2010 DATE OF DISCHARGE:                              OPERATIVE REPORT   PREOPERATIVE DIAGNOSIS:  Ductal carcinoma in situ of the right breast, central clinical stage 0.  POSTOPERATIVE DIAGNOSIS:  Ductal carcinoma in situ of the right breast, central clinical stage 0.  PROCEDURE:  Right total mastectomy.  SURGEON:  Currie Paris, M.D.  ANESTHESIA:  General.  CLINICAL HISTORY:  This is an 77 year old lady who has had a persistent right nipple discharge and has developed a right breast mass, centrally oriented.  Biopsy showed DCIS.  MRI showed a diffuse area of involvement and after lengthy discussion with the patient, by discussions with her primary physician, Dr. Juanetta Gosling and an oncologist, Dr. Welton Flakes, she elected to proceed to a total mastectomy.  Given the diagnosis DCIS and her age, it was decided not to do any node evaluation, other than examination at the time of the surgery.  DESCRIPTION OF PROCEDURE:  I saw the patient in the holding area and she had no further questions.  She understood the surgery as noted above.  I initialed the right breast to see operative side.  The patient was taken to the operating room and after satisfactory general anesthesia had been obtained, the right breast was prepped and draped.  The time-out was done.  I outlined an elliptical incision somewhat transversely oriented.  I marked the edges of the breast tissue with a skin marker.  I made the incision.  I raised the usual skin flaps with a cautery going to the sternum, clavicle, laterally to latissimus into the inframammary fold. The breast was then removed from medial to lateral using cautery taking fascia.  I did not enter the axilla proper.   There what appeared to be a couple of tiny nodes in the axillary tail area.  I oriented the specimen for pathology.  I irrigated twice make sure everything was dry.  I used primarily cautery, but also some suture ligatures on bleeders.  I placed a 19 Blake drain and secured it with a 2-0 nylon.  A third irrigation check for hemostasis was made and everything appeared to be dry.  Incision was closed in layers with 3-0 Vicryl, 4-0 Monocryl subcuticular, Steri-Strips.  The patient tolerated the procedure well and there were no complications.  Estimated blood loss was less 50 mL.  All counts were correct.     Currie Paris, M.D.     CJS/MEDQ  D:  09/25/2010  T:  09/25/2010  Job:  [id number]  Electronically Signed by Cyndia Bent M.D. on 09/28/2010 09:49:01 AM

## 2010-09-29 ENCOUNTER — Telehealth (INDEPENDENT_AMBULATORY_CARE_PROVIDER_SITE_OTHER): Payer: Self-pay | Admitting: General Surgery

## 2010-09-29 NOTE — Telephone Encounter (Signed)
Pt was called with her path report//[id number]

## 2010-09-29 NOTE — Telephone Encounter (Signed)
I called the patient and told her the pathology report and answered questions

## 2010-10-02 ENCOUNTER — Ambulatory Visit (INDEPENDENT_AMBULATORY_CARE_PROVIDER_SITE_OTHER): Payer: Medicare Other | Admitting: General Surgery

## 2010-10-02 ENCOUNTER — Encounter (INDEPENDENT_AMBULATORY_CARE_PROVIDER_SITE_OTHER): Payer: Self-pay | Admitting: General Surgery

## 2010-10-02 DIAGNOSIS — D0510 Intraductal carcinoma in situ of unspecified breast: Secondary | ICD-10-CM

## 2010-10-02 DIAGNOSIS — D0590 Unspecified type of carcinoma in situ of unspecified breast: Secondary | ICD-10-CM

## 2010-10-02 NOTE — Patient Instructions (Signed)
Mastectomy, After Care HOME CARE  Your doctor or nurse will show you how to care for your wound after the bandage is off.   If you wear a bra, put soft padding such as gauze, soft cloth or a nursing pad over your wound.   You may feel depressed or sad about the loss of your breast. This is normal. Ask your doctor about groups that can help you.   The arm on the side that your breast was removed may be slightly swollen. The arm may be hard to move for a while because of soreness and weakness. You may not have much or any feeling (numbness) in that arm but it will slowly feel better. You may feel tingling.   Remember to exercise your arm and shoulder as you were told. One way to exercise your shoulder is to place your hands on a wall. Use your fingers to "climb the wall." Reach as high as you can until you feel a stretch. When you are not exercising, keep your arm up at the same level as or above your heart.   When sitting or lying down, put your arm up on pillows or rolled blankets.   You will always need to take good care of the arm on the side that the breast was removed.   Never let anyone take your blood pressure, draw blood or give you a shot in that arm.   Be careful not to get even a small cut on that arm or hand. Use a thimble when you sew. Wear heavy gloves when you garden.   Use insect repellent on that arm if you are outside and around insects that bite.   Do not use a razor to shave that underarm. You should use only an electric shaver.   Be careful not to burn that arm. Use a glove when you reach into the oven. Cover your arm with a towel or wear a long-sleeved shirt when you are out in the sun.   Wear your watch and other jewelry on the other arm.   Wear a loose fitting rubber glove when you wash the dishes. Do not leave your hand in water for a long time, especially when you use detergents.   Do not cut your cuticles or hang nails. Push cuticles back with a towel after  you take a bath.   Carry your purse or any heavy objects in the other arm. For the first 6 weeks, do not use your arm to lift or push anything heavier than 10 pounds (about one gallon of milk ).  GET HELP RIGHT AWAY IF:  Your arm becomes very puffy (swollen).   You see any signs of infection in your wound such as:   Redness or pain at the wound.   Bad smelling green or yellow drainage from your wound.   Fever over 101 F (38 C).  Document Released: 12/16/2007 Document Re-Released: 06/04/2008 [facility] Patient Information 2011 [place], [place].

## 2010-10-02 NOTE — Progress Notes (Signed)
Procedure: Right total mastectomy September 25, 2010 by Dr. Jamey Ripa  History of Present Ilness: 77 year old Caucasian female who underwent the above-mentioned procedure for extensive right breast DCIS comes in today for her first postoperative visit. She states that she still very anxious about the procedure that she had done. She denies any fevers, chills, nausea, vomiting, diarrhea, or constipation. She has been keeping a drain diary. She reports minimal discomfort. She denies any swelling in her arm. She is only been taking Tylenol on occasion.  Physical Exam: Well-developed well-nourished elderly appearing Caucasian female in no apparent distress Chest-right mastectomy scar with no signs of cellulitis and induration or fluctuance. There is no signs of hematoma or seroma. The Steri-Strips are intact. She has some serous sanguinous drainage in her drain. Extremity-no signs of lymphedema  Pathology: High grade DCIS; margins are negative, zero out of two lymph nodes are negative; T isN0  Assessment and Plan: 77 year old Caucasian female with right breast DCIS (TisN0) status post right total mastectomy. We removed her drain today. She is only had 20 cc out of her drain each day for the past 2 days. I encouraged her to keep a dry gauze over the drain site and to changes as needed for the next several days. She was given wound care instructions. She has asked about home health being available to check on her. I told her I am not sure that she would qualify. However she states that her primary care physician has been a request. We will see her back in 2 weeks' time for recheck.

## 2010-10-06 ENCOUNTER — Telehealth (INDEPENDENT_AMBULATORY_CARE_PROVIDER_SITE_OTHER): Payer: Self-pay

## 2010-10-06 NOTE — Telephone Encounter (Signed)
Pt called in c/o itchy rash under gauze & tape.  She states that benadryl lotion has helped with the itching and rash.  I told Ms. Sprecher to make sure the area is clean & dry, and to call our office if her rash worsens, has a fever or redness & swelling.

## 2010-10-07 ENCOUNTER — Telehealth (INDEPENDENT_AMBULATORY_CARE_PROVIDER_SITE_OTHER): Payer: Self-pay

## 2010-10-07 NOTE — Telephone Encounter (Signed)
Spoke with patient who states her rash is not getting better with benadryl ointment so she washed it off in the shower. She just took her first shower yesterday because she was scared to get her surgery area wet. I advised it is good to wash her surgical area and to keep it dry and clean. Patient has still been using gauze and tape over her incision which she was advised not to do. She was advised to keep tape off of her skin because this can cause a reaction. Patient then stated that this rash is nowhere near surgical site and more on the side of her abdomen. Patient is not currently taking any new medicines.  I advised patient to see her primary medical MD due to her history of shingles. She was reluctant but I informed her this would be the best course of action. Patient will call back if rash develops around surgical site.

## 2010-10-07 NOTE — Telephone Encounter (Signed)
Caroline Aguirre called back this morning c/o worsening rash.  She has tried oral Benadryl and Benadryl ointment with no relief.  She was unable to sleep at all last night.  Pls advise.  Her number is [phone].

## 2010-10-20 ENCOUNTER — Ambulatory Visit (INDEPENDENT_AMBULATORY_CARE_PROVIDER_SITE_OTHER): Payer: Medicare Other | Admitting: Surgery

## 2010-10-20 ENCOUNTER — Encounter (INDEPENDENT_AMBULATORY_CARE_PROVIDER_SITE_OTHER): Payer: Self-pay | Admitting: Surgery

## 2010-10-20 VITALS — BP 144/78 | HR 64 | Temp 97.0°F

## 2010-10-20 DIAGNOSIS — D0590 Unspecified type of carcinoma in situ of unspecified breast: Secondary | ICD-10-CM

## 2010-10-20 DIAGNOSIS — D0510 Intraductal carcinoma in situ of unspecified breast: Secondary | ICD-10-CM

## 2010-10-20 NOTE — Progress Notes (Addendum)
The patient came in for post op visit after mastectomy (R). Original note in chart was for a different post op patient.  As of the day of this addendum I have no specific recollection of the visit except that there were no issues or problems and we planned to see her in 3-4 months

## 2010-10-22 ENCOUNTER — Encounter (INDEPENDENT_AMBULATORY_CARE_PROVIDER_SITE_OTHER): Payer: Medicare Other | Admitting: General Surgery

## 2010-11-03 ENCOUNTER — Encounter (HOSPITAL_COMMUNITY): Payer: Self-pay | Admitting: Oncology

## 2010-11-03 ENCOUNTER — Encounter (HOSPITAL_COMMUNITY): Payer: Medicare Other | Attending: Oncology | Admitting: Oncology

## 2010-11-03 VITALS — BP 130/56 | HR 59 | Temp 97.4°F | Wt 139.8 lb

## 2010-11-03 DIAGNOSIS — D0510 Intraductal carcinoma in situ of unspecified breast: Secondary | ICD-10-CM

## 2010-11-03 DIAGNOSIS — D0590 Unspecified type of carcinoma in situ of unspecified breast: Secondary | ICD-10-CM

## 2010-11-03 NOTE — Patient Instructions (Signed)
[facility]  Discharge Instructions  RECOMMENDATIONS MADE BY THE CONSULTANT AND ANY TEST RESULTS WILL BE SENT TO YOUR REFERRING DOCTOR.   EXAM FINDINGS BY MD TODAY AND SIGNS AND SYMPTOMS TO REPORT TO CLINIC OR PRIMARY MD:  Exam good. You have had the treatment you need..in the surgery  MEDICATIONS PRESCRIBED: none  INSTRUCTIONS GIVEN AND DISCUSSED:Prescription given for prosthesis and bras  SPECIAL INSTRUCTIONS/FOLLOW-UP: 6 months  I acknowledge that I have been informed and understand all the instructions given to me and received a copy. I do not have any more questions at this time, but understand that I may call the Specialty Clinic at [facility] at [phone] during business hours should I have any further questions or need assistance in obtaining follow-up care.    __________________________________________  _____________  __________ Signature of Patient or Authorized Representative            Date                   Time    __________________________________________ Nurse's Signature

## 2010-11-03 NOTE — Progress Notes (Signed)
CC:   Edward L. Juanetta Gosling, M.D. Currie Paris, M.D.  Her doctor is Dr. Cicero Duck.  DIAGNOSIS:  Ductal carcinoma in situ of the right breast status post mastectomy on 09/25/2010.  She had high-grade ductal carcinoma in situ with comedo necrosis, ER/PR negative.  This is an 77 year old lady who had a large abnormality on her mammogram.  Biopsy was consistent with DCIS and she underwent mastectomy on July 6 with clear margins and 2 negative nodes.  She has recuperated well and I think is accepting of this much better than she was before.  I do not think she had any options.  REVIEW OF SYSTEMS:  Today otherwise is negative.  PHYSICAL EXAMINATION:  Vital signs are stable.  She has no lymphadenopathy on physical exam in the cervical, supraclavicular or infraclavicular, axillary areas.  The right chest wall is well-healed. There is no nodularity.  Her heart shows a very regular rhythm and rate without murmur, rub or gallop.  Her left breast is negative for any masses.  She has no abdominal findings consistent with liver enlargement or splenic enlargement.  So her abdomen is very negative.  She has no peripheral arm edema.  She has a little puffiness in both ankles.  She does not admit to shortness of breath, etc. and has no history of heart disease.  We will see her back in 6 months.  We will just observe her.  We will see her then.    ______________________________ Ladona Horns. Mariel Sleet, MD ESN/MEDQ  D:  11/03/2010  T:  11/03/2010  Job:  [id number]

## 2010-11-03 NOTE — Progress Notes (Signed)
This office note has been dictated.

## 2011-01-21 ENCOUNTER — Encounter (INDEPENDENT_AMBULATORY_CARE_PROVIDER_SITE_OTHER): Payer: Self-pay | Admitting: Surgery

## 2011-01-21 ENCOUNTER — Ambulatory Visit (INDEPENDENT_AMBULATORY_CARE_PROVIDER_SITE_OTHER): Payer: Medicare Other | Admitting: Surgery

## 2011-01-21 VITALS — BP 152/64 | HR 76 | Temp 97.6°F | Resp 18 | Ht 63.0 in | Wt 138.4 lb

## 2011-01-21 DIAGNOSIS — Z853 Personal history of malignant neoplasm of breast: Secondary | ICD-10-CM

## 2011-01-21 NOTE — Progress Notes (Signed)
NAME: [name]       DOB: October 14, 1924           DATE: 01/21/2011       MRN: [id number]   Caroline Aguirre is a 77 y.o.Marland Kitchenfemale who presents for routine followup of her Right breast cancer diagnosed in may 2012 and treated with Right mastectomy. She has no problems or concerns on either side.  PFSH: She has had no significant changes since the last visit here.  ROS: There have been no significant changes since the last visit here  EXAM: General: The patient is alert, oriented, generally healty appearing, NAD. Mood and affect are normal.  Breasts:  Right side surgically absent. She has some extraneous tissue that may be shrinking,Left WNL  Lymphatics: She has no axillary or supraclavicular adenopathy on either side.  Extremities: Full ROM of the surgical side with no lymphedema noted.  Data Reviewed: No new data  Impression: Doing well, with no evidence of recurrent cancer or new cancer  Plan: RTC PRN She will do LTF/U with Dr Mariel Sleet

## 2011-05-05 ENCOUNTER — Encounter (HOSPITAL_COMMUNITY): Payer: Medicare Other | Attending: Oncology | Admitting: Oncology

## 2011-05-05 ENCOUNTER — Encounter (HOSPITAL_COMMUNITY): Payer: Self-pay | Admitting: Oncology

## 2011-05-05 VITALS — BP 156/64 | HR 52 | Temp 97.5°F | Wt 143.4 lb

## 2011-05-05 DIAGNOSIS — Z171 Estrogen receptor negative status [ER-]: Secondary | ICD-10-CM

## 2011-05-05 DIAGNOSIS — Z9010 Acquired absence of unspecified breast and nipple: Secondary | ICD-10-CM

## 2011-05-05 DIAGNOSIS — D0590 Unspecified type of carcinoma in situ of unspecified breast: Secondary | ICD-10-CM

## 2011-05-05 DIAGNOSIS — Z853 Personal history of malignant neoplasm of breast: Secondary | ICD-10-CM

## 2011-05-05 NOTE — Patient Instructions (Signed)
Caroline Aguirre  [id number] [birth year]  [facility]  Discharge Instructions  RECOMMENDATIONS MADE BY THE CONSULTANT AND ANY TEST RESULTS WILL BE SENT TO YOUR REFERRING DOCTOR.   EXAM FINDINGS BY MD TODAY AND SIGNS AND SYMPTOMS TO REPORT TO CLINIC OR PRIMARY MD: Per MD you are doing well.  Report any new lumps, bone pain or shortness of breath.  MEDICATIONS PRESCRIBED: none    SPECIAL INSTRUCTIONS/FOLLOW-UP: Return to Clinic in 6 months.   I acknowledge that I have been informed and understand all the instructions given to me and received a copy. I do not have any more questions at this time, but understand that I may call the Specialty Clinic at [facility] at [phone] during business hours should I have any further questions or need assistance in obtaining follow-up care.    __________________________________________  _____________  __________ Signature of Patient or Authorized Representative            Date                   Time    __________________________________________ Nurse's Signature

## 2011-05-05 NOTE — Progress Notes (Signed)
CC:   Edward L. Juanetta Gosling, M.D. Currie Paris, M.D.  DIAGNOSIS:  Extensive ductal carcinoma in situ of the right breast that had to be treated with a mastectomy.  It was estrogen receptor-negative, progesterone receptor-negative, and she underwent mastectomy by Dr. Jamey Ripa on 09/25/2010.  HISTORY:  Caroline Aguirre is doing extremely well.  She is, I think, coping better with the emotional fallout from a mastectomy which she really did not want, but she still has a little bit of a hard time looking in the mirror at times, she states.  But she is doing well.  She is still living with her son, who is bipolar but retired now after 27 years with [employer].  They get along real well, and she has got good range of motion; in fact, she has got perfect range of motion.  Her other issue, she has a little blood pressure issue.  She is on Norvasc.  She is also on Lipitor for cholesterol.  She also takes metoprolol b.i.d. and she is on some Plavix, and I do not see an aspirin listed on her medications.  She had blood work back in July.  I do not think that needs to be repeated today.  REVIEW OF SYSTEMS TODAY:  Really is unremarkable.  PHYSICAL EXAMINATION:  She is alert.  She is oriented.  Weight is 143 pounds, that is very stable.  Blood pressure a little high at 158/64 today, at least the systolic number is high.  She is a little anxious. Right arm sitting position.  Pulse 52-60 and regular, respirations 16 and unlabored.  She is afebrile.  She has no lymphadenopathy in the cervical, supraclavicular, infraclavicular, axillary, or inguinal areas. She has no thyromegaly.  Lungs:  Clear to auscultation percussion.  I cannot really appreciate the scars where she had zoster on the right, but she still has occasional pain due to the bra being a little bit tight because of the prosthesis, she states.  The right chest wall is perfectly negative.  The left breast is negative for any masses. Abdomen:  Soft and  nontender.  Heart:  Shows this regular rhythm without murmur, rub or gallop.  She has no arm or leg edema, and again she demonstrated to me range of motion of both shoulders which is superb.  So I will just see her back in 6 months for followup.  If all is well then, we may just change her to once a year.    ______________________________ Ladona Horns. Mariel Sleet, MD ESN/MEDQ  D:  05/05/2011  T:  05/05/2011  Job:  [id number]

## 2011-05-05 NOTE — Progress Notes (Signed)
This office note has been dictated.

## 2011-05-06 DIAGNOSIS — I6789 Other cerebrovascular disease: Secondary | ICD-10-CM | POA: Diagnosis not present

## 2011-05-06 DIAGNOSIS — I2510 Atherosclerotic heart disease of native coronary artery without angina pectoris: Secondary | ICD-10-CM | POA: Diagnosis not present

## 2011-05-06 DIAGNOSIS — I10 Essential (primary) hypertension: Secondary | ICD-10-CM | POA: Diagnosis not present

## 2011-06-01 DIAGNOSIS — H4010X0 Unspecified open-angle glaucoma, stage unspecified: Secondary | ICD-10-CM | POA: Diagnosis not present

## 2011-06-10 ENCOUNTER — Ambulatory Visit (INDEPENDENT_AMBULATORY_CARE_PROVIDER_SITE_OTHER): Payer: Medicare Other | Admitting: Otolaryngology

## 2011-06-10 DIAGNOSIS — H903 Sensorineural hearing loss, bilateral: Secondary | ICD-10-CM | POA: Diagnosis not present

## 2011-06-10 DIAGNOSIS — H6120 Impacted cerumen, unspecified ear: Secondary | ICD-10-CM

## 2011-06-28 DIAGNOSIS — H4010X0 Unspecified open-angle glaucoma, stage unspecified: Secondary | ICD-10-CM | POA: Diagnosis not present

## 2011-07-29 DIAGNOSIS — B0229 Other postherpetic nervous system involvement: Secondary | ICD-10-CM | POA: Diagnosis not present

## 2011-07-29 DIAGNOSIS — E785 Hyperlipidemia, unspecified: Secondary | ICD-10-CM | POA: Diagnosis not present

## 2011-07-29 DIAGNOSIS — C50919 Malignant neoplasm of unspecified site of unspecified female breast: Secondary | ICD-10-CM | POA: Diagnosis not present

## 2011-07-29 DIAGNOSIS — I10 Essential (primary) hypertension: Secondary | ICD-10-CM | POA: Diagnosis not present

## 2011-09-02 ENCOUNTER — Emergency Department (HOSPITAL_COMMUNITY): Payer: Medicare Other

## 2011-09-02 ENCOUNTER — Inpatient Hospital Stay (HOSPITAL_COMMUNITY)
Admission: EM | Admit: 2011-09-02 | Discharge: 2011-09-07 | DRG: 390 | Disposition: A | Discharge status: Home / Self Care | Payer: Medicare Other | Attending: Pulmonary Disease | Admitting: Pulmonary Disease

## 2011-09-02 DIAGNOSIS — Z853 Personal history of malignant neoplasm of breast: Secondary | ICD-10-CM

## 2011-09-02 DIAGNOSIS — R112 Nausea with vomiting, unspecified: Secondary | ICD-10-CM

## 2011-09-02 DIAGNOSIS — R109 Unspecified abdominal pain: Secondary | ICD-10-CM | POA: Diagnosis not present

## 2011-09-02 DIAGNOSIS — H9190 Unspecified hearing loss, unspecified ear: Secondary | ICD-10-CM | POA: Diagnosis present

## 2011-09-02 DIAGNOSIS — K219 Gastro-esophageal reflux disease without esophagitis: Secondary | ICD-10-CM | POA: Diagnosis present

## 2011-09-02 DIAGNOSIS — K56609 Unspecified intestinal obstruction, unspecified as to partial versus complete obstruction: Secondary | ICD-10-CM | POA: Diagnosis not present

## 2011-09-02 DIAGNOSIS — Z8673 Personal history of transient ischemic attack (TIA), and cerebral infarction without residual deficits: Secondary | ICD-10-CM

## 2011-09-02 DIAGNOSIS — Z9010 Acquired absence of unspecified breast and nipple: Secondary | ICD-10-CM

## 2011-09-02 DIAGNOSIS — Z809 Family history of malignant neoplasm, unspecified: Secondary | ICD-10-CM

## 2011-09-02 DIAGNOSIS — E785 Hyperlipidemia, unspecified: Secondary | ICD-10-CM | POA: Diagnosis present

## 2011-09-02 DIAGNOSIS — F0390 Unspecified dementia without behavioral disturbance: Secondary | ICD-10-CM | POA: Diagnosis present

## 2011-09-02 DIAGNOSIS — I10 Essential (primary) hypertension: Secondary | ICD-10-CM | POA: Diagnosis present

## 2011-09-02 HISTORY — DX: Unspecified intestinal obstruction, unspecified as to partial versus complete obstruction: K56.609

## 2011-09-02 LAB — URINE MICROSCOPIC-ADD ON

## 2011-09-02 LAB — COMPREHENSIVE METABOLIC PANEL
AST: 21 U/L (ref 0–37)
Albumin: 4 g/dL (ref 3.5–5.2)
Alkaline Phosphatase: 116 U/L (ref 39–117)
Chloride: 99 mEq/L (ref 96–112)
Creatinine, Ser: 0.95 mg/dL (ref 0.50–1.10)
Potassium: 3.7 mEq/L (ref 3.5–5.1)
Total Bilirubin: 0.5 mg/dL (ref 0.3–1.2)
Total Protein: 6.9 g/dL (ref 6.0–8.3)

## 2011-09-02 LAB — URINALYSIS, ROUTINE W REFLEX MICROSCOPIC
Glucose, UA: NEGATIVE mg/dL
Ketones, ur: NEGATIVE mg/dL
Nitrite: NEGATIVE
Specific Gravity, Urine: >1.03 — ABNORMAL HIGH (ref 1.005–1.030)
pH: 5.5 (ref 5.0–8.0)

## 2011-09-02 LAB — CBC
MCH: 27.2 pg (ref 26.0–34.0)
MCHC: 33.7 g/dL (ref 30.0–36.0)
RBC: 5.07 MIL/uL (ref 3.87–5.11)
RDW: 14.3 % (ref 11.5–15.5)

## 2011-09-02 LAB — DIFFERENTIAL
Basophils Absolute: 0 10*3/uL (ref 0.0–0.1)
Basophils Relative: 0 % (ref 0–1)
Eosinophils Absolute: 0 10*3/uL (ref 0.0–0.7)
Neutro Abs: 13 10*3/uL — ABNORMAL HIGH (ref 1.7–7.7)
Neutrophils Relative %: 85 % — ABNORMAL HIGH (ref 43–77)

## 2011-09-02 LAB — LIPASE, BLOOD: Lipase: 26 U/L (ref 11–59)

## 2011-09-02 LAB — TROPONIN I: Troponin I: <0.3 ng/mL (ref <0.30)

## 2011-09-02 MED ORDER — ONDANSETRON HCL 4 MG/2ML IJ SOLN: 4.0000 mg | INTRAMUSCULAR | Status: DC | PRN

## 2011-09-02 MED ORDER — SODIUM CHLORIDE 0.9 % IV SOLN
INTRAVENOUS | Status: DC
  Administered 2011-09-02: 21:00:00 via INTRAVENOUS

## 2011-09-02 MED ORDER — FENTANYL CITRATE 0.05 MG/ML IJ SOLN
12.5000 ug | INTRAMUSCULAR | Status: AC | PRN
  Administered 2011-09-02 (×2): 12.5 ug via INTRAVENOUS
  Filled 2011-09-02 (×2): qty 2

## 2011-09-02 MED ORDER — ONDANSETRON HCL 4 MG/2ML IJ SOLN
4.0000 mg | INTRAMUSCULAR | Status: AC | PRN
  Administered 2011-09-02 (×2): 4 mg via INTRAVENOUS
  Filled 2011-09-02 (×2): qty 2

## 2011-09-02 MED ORDER — FENTANYL CITRATE 0.05 MG/ML IJ SOLN
50.0000 ug | INTRAMUSCULAR | Status: AC | PRN
  Administered 2011-09-02 – 2011-09-03 (×2): 100 ug via INTRAVENOUS
  Filled 2011-09-02 (×2): qty 2

## 2011-09-02 NOTE — ED Notes (Signed)
Pt continues to vomit green bile MD made aware

## 2011-09-02 NOTE — ED Provider Notes (Signed)
History   This chart was scribed for Laray Anger, DO by Brooks Sailors. The patient was seen in room APA12/[facility]12. Patient's care was started at 2040.   CSN: [id number]  Arrival date & time 09/02/11  2007   First MD Initiated Contact with Patient 09/02/11 2029      Chief Complaint  Patient presents with  . Emesis     HPI  Pt seen at 2040. Per pt, c/o gradual onset and persistence of constant abdominal "pain" since this morning.  Has been associated with multiple intermittent episodes of nausea and vomiting.  Denies diarrhea, no fevers, no back pain, no CP/SOB, no cough, no black or blood in stools or emesis.   PCP Juanetta Gosling  Past Medical History  Diagnosis Date  . Herniated disc   . Cyst of breast     Right - at 51 yrs of age.  . Shingles   . Hardening of the arteries of the brain   . Stroke 2004    cerebral hemmorhage  . Diverticulitis   . Arthritis   . 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 Hx Breast cancer, DCIS, Right 08/19/2010    Past Surgical History  Procedure Date  . Brain surgery     hemorrhage  . Cystectomy   . Spine surgery     repair herniated disc  . Fibroid tumors   . Breast surgery 09/25/2010    mastectomy    Family History  Problem Relation Age of Onset  . Cancer Mother   . Heart disease Father   . Spinal muscular atrophy Sister     spinal meningitis    History  Substance Use Topics  . Smoking status: Never Smoker   . Smokeless tobacco: Never Used  . Alcohol Use: No    Review of Systems ROS: Statement: All systems negative except as marked or noted in the HPI; Constitutional: Negative for fever and chills. ; ; Eyes: Negative for eye pain, redness and discharge. ; ; ENMT: Negative for ear pain, hoarseness, nasal congestion, sinus pressure and sore throat. ; ; Cardiovascular: Negative for chest pain, palpitations, diaphoresis, dyspnea and peripheral edema. ; ; Respiratory: Negative for cough,  wheezing and stridor. ; ; Gastrointestinal: +N/V, abd pain. Negative for diarrhea, blood in stool, hematemesis, jaundice and rectal bleeding. . ; ; Genitourinary: Negative for dysuria, flank pain and hematuria. ; ; Musculoskeletal: Negative for back pain and neck pain. Negative for swelling and trauma.; ; Skin: Negative for pruritus, rash, abrasions, blisters, bruising and skin lesion.; ; Neuro: Negative for headache, lightheadedness and neck stiffness. Negative for weakness, altered level of consciousness , altered mental status, extremity weakness, paresthesias, involuntary movement, seizure and syncope.     Allergies  Tape; Codeine; and Morphine and related  Home Medications   Current Outpatient Rx  Name Route Sig Dispense Refill  . AMLODIPINE BESYLATE 10 MG PO TABS Oral Take 10 mg by mouth every morning.     . ATORVASTATIN CALCIUM 20 MG PO TABS Oral Take 20 mg by mouth every morning.     Marland Kitchen CALCIUM CARBONATE ANTACID 750 MG PO CHEW Oral Chew 1 tablet by mouth 2 (two) times daily.     Marland Kitchen CLOPIDOGREL BISULFATE 75 MG PO TABS Oral Take 75 mg by mouth every morning.     Marland Kitchen LOSARTAN POTASSIUM-HCTZ 100-12.5 MG PO TABS Oral Take 1 tablet by mouth every morning.     . METHYLCELLULOSE (  LAXATIVE) 500 MG PO TABS Oral Take 1 tablet by mouth daily.     Marland Kitchen METOPROLOL TARTRATE 50 MG PO TABS Oral Take 50 mg by mouth 2 (two) times daily.      . ADULT MULTIVITAMIN W/MINERALS CH Oral Take 1 tablet by mouth every morning. *Contains NO Iron (Ferrous Sulfate and/or Iodine)*    . OMEPRAZOLE 20 MG PO CPDR Oral Take 20 mg by mouth every morning.     Marland Kitchen VITAMIN C 500 MG PO TABS Oral Take 500 mg by mouth every morning.       BP 149/54  Pulse 61  Temp 97.5 F (36.4 C) (Oral)  Resp 20  Ht 5\' 3"  (1.6 m)  Wt 140 lb (63.504 kg)  BMI 24.80 kg/m2  SpO2 100%  Physical Exam 2045: Physical examination:  Nursing notes reviewed; Vital signs and O2 SAT reviewed;  Constitutional: Well developed, Well nourished, Uncomfortable  appearing; Head:  Normocephalic, atraumatic; Eyes: EOMI, PERRL, No scleral icterus; ENMT: Mouth and pharynx normal, Mucous membranes dry; Neck: Supple, Full range of motion, No lymphadenopathy; Cardiovascular: Regular rate and rhythm, No murmur, rub, or gallop; Respiratory: Breath sounds clear & equal bilaterally, No rales, rhonchi, wheezes.  Speaking full sentences with ease, Normal respiratory effort/excursion; Chest: Nontender, Movement normal; Abdomen: Soft, +diffuse tenderness to palp, Nondistended, Normal bowel sounds;;  Extremities: Pulses normal, No tenderness, No edema, No calf edema or asymmetry.; Neuro: AA&Ox3, Major CN grossly intact.  Speech clear. No gross focal motor or sensory deficits in extremities.; Skin: Color normal, Warm, Dry.   ED Course  Procedures    MDM  MDM Reviewed: nursing note, vitals and previous chart Reviewed previous: ECG and labs Interpretation: labs, x-ray, ECG and CT scan     Date: 09/02/2011  Rate: 59  Rhythm: sinus bradycardia  QRS Axis: normal  Intervals: normal  ST/T Wave abnormalities: normal  Conduction Disutrbances:none  Narrative Interpretation:   Old EKG Reviewed: unchanged; no significant changes from previous EKG dated 09/21/2010.  Results for orders placed during the hospital encounter of 09/02/11  CBC      Component Value Range   WBC 15.3 (*) 4.0 - 10.5 K/uL   RBC 5.07  3.87 - 5.11 MIL/uL   Hemoglobin 13.8  12.0 - 15.0 g/dL   HCT 45.4  09.8 - 11.9 %   MCV 80.7  78.0 - 100.0 fL   MCH 27.2  26.0 - 34.0 pg   MCHC 33.7  30.0 - 36.0 g/dL   RDW 14.7  82.9 - 56.2 %   Platelets 271  150 - 400 K/uL  DIFFERENTIAL      Component Value Range   Neutrophils Relative 85 (*) 43 - 77 %   Neutro Abs 13.0 (*) 1.7 - 7.7 K/uL   Lymphocytes Relative 9 (*) 12 - 46 %   Lymphs Abs 1.4  0.7 - 4.0 K/uL   Monocytes Relative 6  3 - 12 %   Monocytes Absolute 0.9  0.1 - 1.0 K/uL   Eosinophils Relative 0  0 - 5 %   Eosinophils Absolute 0.0  0.0 - 0.7 K/uL    Basophils Relative 0  0 - 1 %   Basophils Absolute 0.0  0.0 - 0.1 K/uL  COMPREHENSIVE METABOLIC PANEL      Component Value Range   Sodium 137  135 - 145 mEq/L   Potassium 3.7  3.5 - 5.1 mEq/L   Chloride 99  96 - 112 mEq/L   CO2 25  19 -  32 mEq/L   Glucose, Bld 151 (*) 70 - 99 mg/dL   BUN 21  6 - 23 mg/dL   Creatinine, Ser 2.13  0.50 - 1.10 mg/dL   Calcium 9.9  8.4 - 08.6 mg/dL   Total Protein 6.9  6.0 - 8.3 g/dL   Albumin 4.0  3.5 - 5.2 g/dL   AST 21  0 - 37 U/L   ALT 18  0 - 35 U/L   Alkaline Phosphatase 116  39 - 117 U/L   Total Bilirubin 0.5  0.3 - 1.2 mg/dL   GFR calc non Af Amer 53 (*) >90 mL/min   GFR calc Af Amer 62 (*) >90 mL/min  LIPASE, BLOOD      Component Value Range   Lipase 26  11 - 59 U/L  URINALYSIS, ROUTINE W REFLEX MICROSCOPIC      Component Value Range   Color, Urine AMBER (*) YELLOW   APPearance CLEAR  CLEAR   Specific Gravity, Urine >1.030 (*) 1.005 - 1.030   pH 5.5  5.0 - 8.0   Glucose, UA NEGATIVE  NEGATIVE mg/dL   Hgb urine dipstick NEGATIVE  NEGATIVE   Bilirubin Urine NEGATIVE  NEGATIVE   Ketones, ur NEGATIVE  NEGATIVE mg/dL   Protein, ur NEGATIVE  NEGATIVE mg/dL   Urobilinogen, UA 0.2  0.0 - 1.0 mg/dL   Nitrite NEGATIVE  NEGATIVE   Leukocytes, UA MODERATE (*) NEGATIVE  TROPONIN I      Component Value Range   Troponin I <0.30  <0.30 ng/mL  PROCALCITONIN      Component Value Range   Procalcitonin <0.10    LACTIC ACID, PLASMA      Component Value Range   Lactic Acid, Venous 1.8  0.5 - 2.2 mmol/L  URINE MICROSCOPIC-ADD ON      Component Value Range   Squamous Epithelial / LPF MANY (*) RARE   WBC, UA 7-10  <3 WBC/hpf   RBC / HPF 0-2  <3 RBC/hpf   Bacteria, UA FEW (*) RARE    Dg Abd Acute W/chest 09/02/2011  *RADIOLOGY REPORT*  Clinical Data: Abdominal pain with nausea vomiting.  ACUTE ABDOMEN SERIES (ABDOMEN 2 VIEW & CHEST 1 VIEW)  Comparison: Plain film chest of 09/21/2010.  Abdominal pelvic CT of 01/01/2009.  Findings: Patient rotated  minimally to the right. Midline trachea. Mild cardiomegaly with atherosclerosis in the transverse aorta. No congestive failure.  Mild left base scarring.  Upright view of the abdomen demonstrates numerous air-fluid levels in small bowel loops. No free intraperitoneal air.  Supine view demonstrates a stool within the ascending colon.  No significant bowel dilatation.  Distal gas.  Phleboliths in the pelvis.  IMPRESSION: Nonspecific bowel gas pattern.  Air fluid levels within nondilated small bowel loops.   Question low grade partial small bowel obstruction or gastroenteritis with adynamic ileus.  Cardiomegaly without congestive failure.  Original Report Authenticated By: Consuello Bossier, M.D.     12:02 AM:  Pt continues to c/o abd pain and N/V despite IV pain meds and antiemetics.  +SBO vs ileus on AXR.  Possible UTI vs contamination, UC pending.  No diarrhea while in the ED.  T/C to [facility] Dr. Orvan Falconer, case discussed, including:  HPI, pertinent PM/SHx, VS/PE, dx testing, ED course and treatment:  Agreeable to admit for Dr. Juanetta Gosling, requests to obtain medical bed.          I personally performed the services described in this documentation, which was scribed in my presence. The  recorded information has been reviewed and considered. Andyn Sales Allison Quarry, DO 09/03/11 1457

## 2011-09-02 NOTE — ED Notes (Signed)
Attempted to draw blood; unable to gain access; discussed with MD May use feet or arterial stick by Respiratory

## 2011-09-02 NOTE — ED Notes (Signed)
Patient transported to X-ray 

## 2011-09-02 NOTE — ED Notes (Signed)
Nausea and vomiting with abdominal pain ?

## 2011-09-03 ENCOUNTER — Encounter (HOSPITAL_COMMUNITY): Payer: Self-pay | Admitting: Internal Medicine

## 2011-09-03 DIAGNOSIS — Z8673 Personal history of transient ischemic attack (TIA), and cerebral infarction without residual deficits: Secondary | ICD-10-CM | POA: Diagnosis not present

## 2011-09-03 DIAGNOSIS — Z853 Personal history of malignant neoplasm of breast: Secondary | ICD-10-CM | POA: Diagnosis not present

## 2011-09-03 DIAGNOSIS — Z809 Family history of malignant neoplasm, unspecified: Secondary | ICD-10-CM | POA: Diagnosis not present

## 2011-09-03 DIAGNOSIS — Q419 Congenital absence, atresia and stenosis of small intestine, part unspecified: Secondary | ICD-10-CM | POA: Diagnosis not present

## 2011-09-03 DIAGNOSIS — Z9010 Acquired absence of unspecified breast and nipple: Secondary | ICD-10-CM | POA: Diagnosis not present

## 2011-09-03 DIAGNOSIS — K219 Gastro-esophageal reflux disease without esophagitis: Secondary | ICD-10-CM | POA: Diagnosis not present

## 2011-09-03 DIAGNOSIS — F0390 Unspecified dementia without behavioral disturbance: Secondary | ICD-10-CM | POA: Diagnosis present

## 2011-09-03 DIAGNOSIS — K5712 Diverticulitis of small intestine without perforation or abscess without bleeding: Secondary | ICD-10-CM | POA: Diagnosis not present

## 2011-09-03 DIAGNOSIS — H9190 Unspecified hearing loss, unspecified ear: Secondary | ICD-10-CM | POA: Diagnosis present

## 2011-09-03 DIAGNOSIS — I10 Essential (primary) hypertension: Secondary | ICD-10-CM | POA: Diagnosis not present

## 2011-09-03 DIAGNOSIS — K56609 Unspecified intestinal obstruction, unspecified as to partial versus complete obstruction: Secondary | ICD-10-CM | POA: Diagnosis present

## 2011-09-03 DIAGNOSIS — E785 Hyperlipidemia, unspecified: Secondary | ICD-10-CM | POA: Diagnosis not present

## 2011-09-03 DIAGNOSIS — R109 Unspecified abdominal pain: Secondary | ICD-10-CM | POA: Diagnosis not present

## 2011-09-03 LAB — HEPATIC FUNCTION PANEL
ALT: 16 U/L (ref 0–35)
Bilirubin, Direct: 0.1 mg/dL (ref 0.0–0.3)
Indirect Bilirubin: 0.4 mg/dL (ref 0.3–0.9)
Total Protein: 6.5 g/dL (ref 6.0–8.3)

## 2011-09-03 LAB — CBC
MCH: 27 pg (ref 26.0–34.0)
MCV: 80.5 fL (ref 78.0–100.0)
Platelets: 276 10*3/uL (ref 150–400)
RBC: 4.88 MIL/uL (ref 3.87–5.11)
RDW: 14.4 % (ref 11.5–15.5)
WBC: 13.7 10*3/uL — ABNORMAL HIGH (ref 4.0–10.5)

## 2011-09-03 LAB — BASIC METABOLIC PANEL
CO2: 26 mEq/L (ref 19–32)
Calcium: 9.4 mg/dL (ref 8.4–10.5)
Creatinine, Ser: 0.84 mg/dL (ref 0.50–1.10)
GFR calc non Af Amer: 62 mL/min — ABNORMAL LOW (ref >90)
Glucose, Bld: 192 mg/dL — ABNORMAL HIGH (ref 70–99)
Sodium: 136 mEq/L (ref 135–145)

## 2011-09-03 MED ORDER — ACETAMINOPHEN 650 MG RE SUPP: 650.0000 mg | Freq: Four times a day (QID) | RECTAL | Status: DC | PRN

## 2011-09-03 MED ORDER — METRONIDAZOLE IN NACL 5-0.79 MG/ML-% IV SOLN
500.0000 mg | Freq: Three times a day (TID) | INTRAVENOUS | Status: DC
  Administered 2011-09-03 – 2011-09-07 (×13): 500 mg via INTRAVENOUS
  Filled 2011-09-03 (×14): qty 100

## 2011-09-03 MED ORDER — IOHEXOL 300 MG/ML  SOLN
100.0000 mL | Freq: Once | INTRAMUSCULAR | Status: AC | PRN
  Administered 2011-09-03: 100 mL via INTRAVENOUS

## 2011-09-03 MED ORDER — HYDROMORPHONE HCL PF 1 MG/ML IJ SOLN
0.5000 mg | INTRAMUSCULAR | Status: DC | PRN
  Administered 2011-09-03: 0.5 mg via INTRAVENOUS
  Filled 2011-09-03 (×2): qty 1

## 2011-09-03 MED ORDER — CIPROFLOXACIN IN D5W 400 MG/200ML IV SOLN
400.0000 mg | Freq: Two times a day (BID) | INTRAVENOUS | Status: DC
  Administered 2011-09-03 – 2011-09-07 (×9): 400 mg via INTRAVENOUS
  Filled 2011-09-03 (×9): qty 200

## 2011-09-03 MED ORDER — PANTOPRAZOLE SODIUM 40 MG IV SOLR
40.0000 mg | Freq: Two times a day (BID) | INTRAVENOUS | Status: DC
  Administered 2011-09-03 – 2011-09-06 (×9): 40 mg via INTRAVENOUS
  Filled 2011-09-03 (×9): qty 40

## 2011-09-03 MED ORDER — METOPROLOL TARTRATE 1 MG/ML IV SOLN
5.0000 mg | Freq: Four times a day (QID) | INTRAVENOUS | Status: DC
  Administered 2011-09-03 – 2011-09-07 (×18): 5 mg via INTRAVENOUS
  Filled 2011-09-03 (×18): qty 5

## 2011-09-03 MED ORDER — ONDANSETRON HCL 4 MG/2ML IJ SOLN: 4.0000 mg | INTRAMUSCULAR | Status: DC | PRN

## 2011-09-03 MED ORDER — POTASSIUM CHLORIDE IN NACL 20-0.9 MEQ/L-% IV SOLN
INTRAVENOUS | Status: DC
  Administered 2011-09-03 – 2011-09-07 (×9): via INTRAVENOUS

## 2011-09-03 MED ORDER — METRONIDAZOLE IN NACL 5-0.79 MG/ML-% IV SOLN
INTRAVENOUS | Status: AC
  Filled 2011-09-03: qty 100

## 2011-09-03 MED ORDER — ONDANSETRON HCL 4 MG/2ML IJ SOLN: 4.0000 mg | Freq: Three times a day (TID) | INTRAMUSCULAR | Status: AC | PRN

## 2011-09-03 MED ORDER — SODIUM CHLORIDE 0.9 % IV SOLN: INTRAVENOUS | Status: AC

## 2011-09-03 MED ORDER — BIOTENE DRY MOUTH MT LIQD
15.0000 mL | Freq: Two times a day (BID) | OROMUCOSAL | Status: DC
  Administered 2011-09-03 – 2011-09-07 (×9): 15 mL via OROMUCOSAL

## 2011-09-03 MED ORDER — ENOXAPARIN SODIUM 30 MG/0.3ML ~~LOC~~ SOLN
30.0000 mg | SUBCUTANEOUS | Status: DC
  Administered 2011-09-03: 30 mg via SUBCUTANEOUS
  Filled 2011-09-03: qty 0.3

## 2011-09-03 MED ORDER — SODIUM CHLORIDE 0.9 % IJ SOLN
INTRAMUSCULAR | Status: AC
  Administered 2011-09-03: 10 mL
  Filled 2011-09-03: qty 3

## 2011-09-03 MED ORDER — ONDANSETRON HCL 4 MG PO TABS: 4.0000 mg | ORAL_TABLET | Freq: Four times a day (QID) | ORAL | Status: DC | PRN

## 2011-09-03 MED ORDER — ACETAMINOPHEN 325 MG PO TABS: 650.0000 mg | ORAL_TABLET | Freq: Four times a day (QID) | ORAL | Status: DC | PRN

## 2011-09-03 MED ORDER — ENOXAPARIN SODIUM 40 MG/0.4ML ~~LOC~~ SOLN
40.0000 mg | SUBCUTANEOUS | Status: DC
  Administered 2011-09-04 – 2011-09-07 (×4): 40 mg via SUBCUTANEOUS
  Filled 2011-09-03 (×4): qty 0.4

## 2011-09-03 MED ORDER — CIPROFLOXACIN IN D5W 400 MG/200ML IV SOLN
INTRAVENOUS | Status: AC
  Filled 2011-09-03: qty 200

## 2011-09-03 NOTE — Care Management Note (Signed)
    Page 1 of 1   09/07/2011     1:32:39 PM   CARE MANAGEMENT NOTE 09/07/2011  Patient:  Caroline Aguirre, Caroline Aguirre   Account Number:  [id number]  Date Initiated:  09/03/2011  Documentation initiated by:  Sharrie Rothman  Subjective/Objective Assessment:   Pt admitted from home with SBO. Pt has a son who lives with her and is fairly independent with ADL's. Pt will return home at discharge. Pt has a cane for assistance with ambulation.     Action/Plan:   No CM needs noted at this time.   Anticipated DC Date:  09/06/2011   Anticipated DC Plan:  HOME/SELF CARE      DC Planning Services  CM consult      Choice offered to / List presented to:             Status of service:  Completed, signed off Medicare Important Message given?   (If response is "NO", the following Medicare IM given date fields will be blank) Date Medicare IM given:   Date Additional Medicare IM given:    Discharge Disposition:  HOME/SELF CARE  Per UR Regulation:    If discussed at Long Length of Stay Meetings, dates discussed:    Comments:  09/07/11 1331 Arlyss Queen, RN BSN CM Pt discharged home today. No CM needs noted.  09/03/11 1506 Arlyss Queen, RN BSN CM

## 2011-09-03 NOTE — H&P (Signed)
PCP:   Fredirick Maudlin, MD   Chief Complaint:  Abdominal pain and vomiting since this morning  HPI: Caroline Aguirre is an 78 y.o. female.   Caucasian lady lives at home recently had mastectomy for insitu breast carcinoma, presents with the above symptoms. He reports the pain as more upper abdominal colicky; vomitus contains large quantities of clear liquid; she has vomited about 9 times past 12 hours abdomen is getting distended.  Denies fever cough or cold chest pain shortness of breath, bloody or black stool.  Past Medical History  Diagnosis Date  . Herniated disc   . Cyst of breast     Right - at 35 yrs of age.  . Shingles   . Hardening of the arteries of the brain   . Stroke 2004    cerebral hemmorhage  . Diverticulitis   . Arthritis   . 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 Hx Breast cancer, DCIS, Right 08/19/2010    Past Surgical History  Procedure Date  . Brain surgery     hemorrhage  . Cystectomy   . Spine surgery     repair herniated disc  . Fibroid tumors   . Breast surgery 09/25/2010    mastectomy    Medications:  HOME MEDS: Prior to Admission medications   Medication Sig Start Date End Date Taking? Authorizing Provider  amLODipine (NORVASC) 10 MG tablet Take 10 mg by mouth every morning.    Yes Historical Provider, MD  atorvastatin (LIPITOR) 20 MG tablet Take 20 mg by mouth every morning.    Yes Historical Provider, MD  calcium carbonate (TUMS EX) 750 MG chewable tablet Chew 1 tablet by mouth 2 (two) times daily.    Yes Historical Provider, MD  clopidogrel (PLAVIX) 75 MG tablet Take 75 mg by mouth every morning.    Yes Historical Provider, MD  losartan-hydrochlorothiazide (HYZAAR) 100-12.5 MG per tablet Take 1 tablet by mouth every morning.    Yes Historical Provider, MD  Methylcellulose, Laxative, (CITRUCEL) 500 MG TABS Take 1 tablet by mouth daily.    Yes Historical Provider, MD  metoprolol (LOPRESSOR) 50 MG  tablet Take 50 mg by mouth 2 (two) times daily.     Yes Historical Provider, MD  Multiple Vitamin (MULTIVITAMIN WITH MINERALS) TABS Take 1 tablet by mouth every morning. *Contains NO Iron (Ferrous Sulfate and/or Iodine)*   Yes Historical Provider, MD  omeprazole (PRILOSEC) 20 MG capsule Take 20 mg by mouth every morning.    Yes Historical Provider, MD  vitamin C (ASCORBIC ACID) 500 MG tablet Take 500 mg by mouth every morning.    Yes Historical Provider, MD     Allergies:  Allergies  Allergen Reactions  . Tape Rash  . Codeine Nausea Only    nausea.  . Morphine And Related Nausea Only    Nausea only.    Social History:   reports that she has never smoked. She has never used smokeless tobacco. She reports that she does not drink alcohol or use illicit drugs.  Family History: Family History  Problem Relation Age of Onset  . Cancer Mother   . Heart disease Father   . Spinal muscular atrophy Sister     spinal meningitis     Physical Exam: Filed Vitals:   09/02/11 2008 09/02/11 2301  BP: 149/54 149/49  Pulse: 61 62  Temp: 97.5 F (36.4 C)   TempSrc: Oral   Resp: 20  20  Height: 5\' 3"  (1.6 m)   Weight: 63.504 kg (140 lb)   SpO2: 100% 96%   Blood pressure 149/49, pulse 62, temperature 97.5 F (36.4 C), temperature source Oral, resp. rate 20, height 5\' 3"  (1.6 m), weight 63.504 kg (140 lb), SpO2 96.00%.  GEN:  Pleasant elderly Caucasian lady  lying in the stretcher in no acute distress; cooperative with exam PSYCH:  alert and oriented x4; does not appear anxious or depressed; affect is appropriate. HEENT: Mucous membranes pink dry,  and anicteric; PERRLA; EOM intact; no cervical lymphadenopathy nor thyromegaly or carotid bruit; no JVD; Breasts:: Not examined CHEST WALL: No tenderness CHEST: Normal respiration, clear to auscultation bilaterally HEART: Regular rate and rhythm; no murmurs rubs or gallops BACK:  no CVA tenderness ABDOMEN: Obese,  generalized abdominal  tenderness worse in the upper abdomen ; no masses, no organomegaly,  diminished bowel sounds in the lower abdomen; increased  abdominal bowel sounds upper abdomen ;  no intertriginous candida. Rectal Exam: Not done EXTREMITIES:  age-appropriate arthropathy of the hands and knees; no edema; no ulcerations. Genitalia: not examined PULSES: 2+ and symmetric SKIN: Normal hydration no rash or ulceration CNS: Cranial nerves 2-12 grossly intact no focal lateralizing neurologic deficit   Labs & Imaging Results for orders placed during the hospital encounter of 09/02/11 (from the past 48 hour(s))  CBC     Status: Abnormal   Collection Time   09/02/11  8:44 PM      Component Value Range Comment   WBC 15.3 (*) 4.0 - 10.5 K/uL    RBC 5.07  3.87 - 5.11 MIL/uL    Hemoglobin 13.8  12.0 - 15.0 g/dL    HCT 16.1  09.6 - 04.5 %    MCV 80.7  78.0 - 100.0 fL    MCH 27.2  26.0 - 34.0 pg    MCHC 33.7  30.0 - 36.0 g/dL    RDW 40.9  81.1 - 91.4 %    Platelets 271  150 - 400 K/uL   DIFFERENTIAL     Status: Abnormal   Collection Time   09/02/11  8:44 PM      Component Value Range Comment   Neutrophils Relative 85 (*) 43 - 77 %    Neutro Abs 13.0 (*) 1.7 - 7.7 K/uL    Lymphocytes Relative 9 (*) 12 - 46 %    Lymphs Abs 1.4  0.7 - 4.0 K/uL    Monocytes Relative 6  3 - 12 %    Monocytes Absolute 0.9  0.1 - 1.0 K/uL    Eosinophils Relative 0  0 - 5 %    Eosinophils Absolute 0.0  0.0 - 0.7 K/uL    Basophils Relative 0  0 - 1 %    Basophils Absolute 0.0  0.0 - 0.1 K/uL   COMPREHENSIVE METABOLIC PANEL     Status: Abnormal   Collection Time   09/02/11  8:44 PM      Component Value Range Comment   Sodium 137  135 - 145 mEq/L    Potassium 3.7  3.5 - 5.1 mEq/L    Chloride 99  96 - 112 mEq/L    CO2 25  19 - 32 mEq/L    Glucose, Bld 151 (*) 70 - 99 mg/dL    BUN 21  6 - 23 mg/dL    Creatinine, Ser 7.82  0.50 - 1.10 mg/dL    Calcium 9.9  8.4 - 95.6 mg/dL    Total  Protein 6.9  6.0 - 8.3 g/dL    Albumin 4.0  3.5 -  5.2 g/dL    AST 21  0 - 37 U/L    ALT 18  0 - 35 U/L    Alkaline Phosphatase 116  39 - 117 U/L    Total Bilirubin 0.5  0.3 - 1.2 mg/dL    GFR calc non Af Amer 53 (*) >90 mL/min    GFR calc Af Amer 62 (*) >90 mL/min   LIPASE, BLOOD     Status: Normal   Collection Time   09/02/11  8:44 PM      Component Value Range Comment   Lipase 26  11 - 59 U/L   TROPONIN I     Status: Normal   Collection Time   09/02/11  8:44 PM      Component Value Range Comment   Troponin I <0.30  <0.30 ng/mL   PROCALCITONIN     Status: Normal   Collection Time   09/02/11  8:53 PM      Component Value Range Comment   Procalcitonin <0.10     LACTIC ACID, PLASMA     Status: Normal   Collection Time   09/02/11  8:53 PM      Component Value Range Comment   Lactic Acid, Venous 1.8  0.5 - 2.2 mmol/L   URINALYSIS, ROUTINE W REFLEX MICROSCOPIC     Status: Abnormal   Collection Time   09/02/11 11:07 PM      Component Value Range Comment   Color, Urine AMBER (*) YELLOW BIOCHEMICALS MAY BE AFFECTED BY COLOR   APPearance CLEAR  CLEAR    Specific Gravity, Urine >1.030 (*) 1.005 - 1.030    pH 5.5  5.0 - 8.0    Glucose, UA NEGATIVE  NEGATIVE mg/dL    Hgb urine dipstick NEGATIVE  NEGATIVE    Bilirubin Urine NEGATIVE  NEGATIVE    Ketones, ur NEGATIVE  NEGATIVE mg/dL    Protein, ur NEGATIVE  NEGATIVE mg/dL    Urobilinogen, UA 0.2  0.0 - 1.0 mg/dL    Nitrite NEGATIVE  NEGATIVE    Leukocytes, UA MODERATE (*) NEGATIVE   URINE MICROSCOPIC-ADD ON     Status: Abnormal   Collection Time   09/02/11 11:07 PM      Component Value Range Comment   Squamous Epithelial / LPF MANY (*) RARE    WBC, UA 7-10  <3 WBC/hpf    RBC / HPF 0-2  <3 RBC/hpf    Bacteria, UA FEW (*) RARE    Ct Abdomen Pelvis W Contrast  09/03/2011  *RADIOLOGY REPORT*  Clinical Data: Abdominal pain.  Nausea vomiting.  Right breast cancer, status post mastectomy.  Diverticulitis.  Appendectomy.  CT ABDOMEN AND PELVIS WITH CONTRAST  Technique:  Multidetector CT  imaging of the abdomen and pelvis was performed following the standard protocol during bolus administration of intravenous contrast.  Contrast: OMNIPAQUE IOHEXOL 300 MG/ML  SOLN  Comparison: Acute abdomen series of 1 day prior.  Abdominal pelvic CT of 01/01/2009.  Findings: 2 mm right lower lobe lung nodule is unchanged on image 10 of series 3.  Right mastectomy.  Mild cardiomegaly, without pericardial or pleural effusion.  A small hiatal hernia.  Suspect trace perihepatic fluid or peritoneal thickening on image 22. Focal steatosis adjacent the falciform ligament.  Normal spleen, remainder of stomach.  Normal pancreas, gallbladder, biliary tract, right adrenal gland.  The left adrenal is mildly nodular, unchanged.  Bilateral  renal cysts and too small to characterize lesions.  No retroperitoneal or retrocrural adenopathy.  The colon is decompressed.  There is extensive sigmoid colon diverticulosis and muscular hypertrophy.  Normal terminal ileum.  The distal ileum is relatively decompressed.  The inflammation described on the prior is resolved. Focal transition identified at the mid ileum on image 72 of series 2.  There is dilatation of more proximal small bowel loops; 2.9 cm example.  There is mild mesenteric edema within the dilated small bowel loops.  No wall thickening. No pneumatosis or free intraperitoneal air.  Relatively mild for age mesenteric atherosclerosis without arterial or venous thrombus.  No pelvic adenopathy.  Probable pelvic floor laxity.  Otherwise normal urinary bladder.  Hysterectomy.  No adnexal mass.  Trace cul- de-sac fluid on image 71.  Moderate osteopenia.  Trace L4-L5 anterolisthesis is likely degenerative.  IMPRESSION: Small bowel obstruction with transition in the mid ileum.  The transition could be related to scarring from prior enteritis or underlying adhesions.  There is a small volume mesenteric edema and abdominal pelvic ascites.  This is nonspecific.  Complicating ischemia  cannot be entirely excluded.  Consider nasogastric tube decompression.  Original Report Authenticated By: Consuello Bossier, M.D.   Dg Abd Acute W/chest  09/02/2011  *RADIOLOGY REPORT*  Clinical Data: Abdominal pain with nausea vomiting.  ACUTE ABDOMEN SERIES (ABDOMEN 2 VIEW & CHEST 1 VIEW)  Comparison: Plain film chest of 09/21/2010.  Abdominal pelvic CT of 01/01/2009.  Findings: Patient rotated minimally to the right. Midline trachea. Mild cardiomegaly with atherosclerosis in the transverse aorta. No congestive failure.  Mild left base scarring.  Upright view of the abdomen demonstrates numerous air-fluid levels in small bowel loops. No free intraperitoneal air.  Supine view demonstrates a stool within the ascending colon.  No significant bowel dilatation.  Distal gas.  Phleboliths in the pelvis.  IMPRESSION: Nonspecific bowel gas pattern.  Air fluid levels within nondilated small bowel loops.   Question low grade partial small bowel obstruction or gastroenteritis with adynamic ileus.  Cardiomegaly without congestive failure.  Original Report Authenticated By: Consuello Bossier, M.D.      Assessment Present on Admission:  .SBO (small bowel obstruction) .High blood pressure .Gastroesophageal reflux disease with hiatal hernia .Hearing loss   PLAN:  We'll admit for medical management of small bowel obstruction;  Divert maintenance medication tol intravenous Consult surgery for assistance with management  Further plans depending on developments over the next few hours  Other plans as per orders.   Asif Muchow 09/03/2011, 1:51 AM

## 2011-09-03 NOTE — Consult Note (Signed)
Reason for Consult: Small bowel obstruction Referring Physician: [facility]  Caroline Aguirre is an 78 y.o. female.  HPI: Patient is an 78 year old female with multiple medical problems including dementia who presented to [facility] with nausea vomiting.  Patient is an extremely poor historian but is able to answer some questions and follow commands appropriately. Apparently her symptoms in the groin on for several day per the patient's family. She's had similar symptomatology in the past and has required a small bowel resection several years ago at [facility] due to abowel obstruction at that time.  It is unclear when her last bowel movement has been. She denies passing any flatus. She still has some nausea currently which is better now that the nasogastric tube is in position.  Past Medical History  Diagnosis Date  . Herniated disc   . Cyst of breast     Right - at 38 yrs of age.  . Shingles   . Hardening of the arteries of the brain   . Stroke 2004    cerebral hemmorhage  . Diverticulitis   . Arthritis   . 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 Hx Breast cancer, DCIS, Right 08/19/2010    Past Surgical History  Procedure Date  . Brain surgery     hemorrhage  . Cystectomy   . Spine surgery     repair herniated disc  . Fibroid tumors   . Breast surgery 09/25/2010    mastectomy    Family History  Problem Relation Age of Onset  . Cancer Mother   . Heart disease Father   . Spinal muscular atrophy Sister     spinal meningitis    Social History:  reports that she has never smoked. She has never used smokeless tobacco. She reports that she does not drink alcohol or use illicit drugs.  Allergies:  Allergies  Allergen Reactions  . Tape Rash  . Codeine Nausea Only    nausea.  . Morphine And Related Nausea Only    Nausea only.    Medications:  I have reviewed the patient's current medications. Prior to  Admission:  Prescriptions prior to admission  Medication Sig Dispense Refill  . amLODipine (NORVASC) 10 MG tablet Take 10 mg by mouth every morning.       Marland Kitchen atorvastatin (LIPITOR) 20 MG tablet Take 20 mg by mouth every morning.       . calcium carbonate (TUMS EX) 750 MG chewable tablet Chew 1 tablet by mouth 2 (two) times daily.       . clopidogrel (PLAVIX) 75 MG tablet Take 75 mg by mouth every morning.       Marland Kitchen losartan-hydrochlorothiazide (HYZAAR) 100-12.5 MG per tablet Take 1 tablet by mouth every morning.       . Methylcellulose, Laxative, (CITRUCEL) 500 MG TABS Take 1 tablet by mouth daily.       . metoprolol (LOPRESSOR) 50 MG tablet Take 50 mg by mouth 2 (two) times daily.        . Multiple Vitamin (MULTIVITAMIN WITH MINERALS) TABS Take 1 tablet by mouth every morning. *Contains NO Iron (Ferrous Sulfate and/or Iodine)*      . omeprazole (PRILOSEC) 20 MG capsule Take 20 mg by mouth every morning.       . vitamin C (ASCORBIC ACID) 500 MG tablet Take 500 mg by mouth every morning.        Scheduled:   .  sodium chloride   Intravenous STAT  . antiseptic oral rinse  15 mL Mouth Rinse BID  . ciprofloxacin  400 mg Intravenous Q12H  . enoxaparin  40 mg Subcutaneous Q24H  . metoprolol  5 mg Intravenous Q6H  . metronidazole  500 mg Intravenous Q8H  . pantoprazole (PROTONIX) IV  40 mg Intravenous Q12H  . sodium chloride      . DISCONTD: enoxaparin  30 mg Subcutaneous Q24H   Continuous:   . 0.9 % NaCl with KCl 20 mEq / L 125 mL/hr at 09/03/11 1546  . DISCONTD: sodium chloride 75 mL/hr at 09/02/11 2117   NWG:NFAOZHYQMVHQI, acetaminophen, fentaNYL, fentaNYL, HYDROmorphone (DILAUDID) injection, iohexol, ondansetron, ondansetron (ZOFRAN) IV, ondansetron (ZOFRAN) IV, ondansetron, DISCONTD: ondansetron  Results for orders placed during the hospital encounter of 09/02/11 (from the past 48 hour(s))  CBC     Status: Abnormal   Collection Time   09/02/11  8:44 PM      Component Value Range Comment     WBC 15.3 (*) 4.0 - 10.5 K/uL    RBC 5.07  3.87 - 5.11 MIL/uL    Hemoglobin 13.8  12.0 - 15.0 g/dL    HCT 69.6  29.5 - 28.4 %    MCV 80.7  78.0 - 100.0 fL    MCH 27.2  26.0 - 34.0 pg    MCHC 33.7  30.0 - 36.0 g/dL    RDW 13.2  44.0 - 10.2 %    Platelets 271  150 - 400 K/uL   DIFFERENTIAL     Status: Abnormal   Collection Time   09/02/11  8:44 PM      Component Value Range Comment   Neutrophils Relative 85 (*) 43 - 77 %    Neutro Abs 13.0 (*) 1.7 - 7.7 K/uL    Lymphocytes Relative 9 (*) 12 - 46 %    Lymphs Abs 1.4  0.7 - 4.0 K/uL    Monocytes Relative 6  3 - 12 %    Monocytes Absolute 0.9  0.1 - 1.0 K/uL    Eosinophils Relative 0  0 - 5 %    Eosinophils Absolute 0.0  0.0 - 0.7 K/uL    Basophils Relative 0  0 - 1 %    Basophils Absolute 0.0  0.0 - 0.1 K/uL   COMPREHENSIVE METABOLIC PANEL     Status: Abnormal   Collection Time   09/02/11  8:44 PM      Component Value Range Comment   Sodium 137  135 - 145 mEq/L    Potassium 3.7  3.5 - 5.1 mEq/L    Chloride 99  96 - 112 mEq/L    CO2 25  19 - 32 mEq/L    Glucose, Bld 151 (*) 70 - 99 mg/dL    BUN 21  6 - 23 mg/dL    Creatinine, Ser 7.25  0.50 - 1.10 mg/dL    Calcium 9.9  8.4 - 36.6 mg/dL    Total Protein 6.9  6.0 - 8.3 g/dL    Albumin 4.0  3.5 - 5.2 g/dL    AST 21  0 - 37 U/L    ALT 18  0 - 35 U/L    Alkaline Phosphatase 116  39 - 117 U/L    Total Bilirubin 0.5  0.3 - 1.2 mg/dL    GFR calc non Af Amer 53 (*) >90 mL/min    GFR calc Af Amer 62 (*) >90 mL/min   LIPASE, BLOOD  Status: Normal   Collection Time   09/02/11  8:44 PM      Component Value Range Comment   Lipase 26  11 - 59 U/L   TROPONIN I     Status: Normal   Collection Time   09/02/11  8:44 PM      Component Value Range Comment   Troponin I <0.30  <0.30 ng/mL   PROCALCITONIN     Status: Normal   Collection Time   09/02/11  8:53 PM      Component Value Range Comment   Procalcitonin <0.10     LACTIC ACID, PLASMA     Status: Normal   Collection Time    09/02/11  8:53 PM      Component Value Range Comment   Lactic Acid, Venous 1.8  0.5 - 2.2 mmol/L   URINALYSIS, ROUTINE W REFLEX MICROSCOPIC     Status: Abnormal   Collection Time   09/02/11 11:07 PM      Component Value Range Comment   Color, Urine AMBER (*) YELLOW BIOCHEMICALS MAY BE AFFECTED BY COLOR   APPearance CLEAR  CLEAR    Specific Gravity, Urine >1.030 (*) 1.005 - 1.030    pH 5.5  5.0 - 8.0    Glucose, UA NEGATIVE  NEGATIVE mg/dL    Hgb urine dipstick NEGATIVE  NEGATIVE    Bilirubin Urine NEGATIVE  NEGATIVE    Ketones, ur NEGATIVE  NEGATIVE mg/dL    Protein, ur NEGATIVE  NEGATIVE mg/dL    Urobilinogen, UA 0.2  0.0 - 1.0 mg/dL    Nitrite NEGATIVE  NEGATIVE    Leukocytes, UA MODERATE (*) NEGATIVE   URINE MICROSCOPIC-ADD ON     Status: Abnormal   Collection Time   09/02/11 11:07 PM      Component Value Range Comment   Squamous Epithelial / LPF MANY (*) RARE    WBC, UA 7-10  <3 WBC/hpf    RBC / HPF 0-2  <3 RBC/hpf    Bacteria, UA FEW (*) RARE   BASIC METABOLIC PANEL     Status: Abnormal   Collection Time   09/03/11  4:58 AM      Component Value Range Comment   Sodium 136  135 - 145 mEq/L    Potassium 3.9  3.5 - 5.1 mEq/L    Chloride 97  96 - 112 mEq/L    CO2 26  19 - 32 mEq/L    Glucose, Bld 192 (*) 70 - 99 mg/dL    BUN 19  6 - 23 mg/dL    Creatinine, Ser 1.61  0.50 - 1.10 mg/dL    Calcium 9.4  8.4 - 09.6 mg/dL    GFR calc non Af Amer 62 (*) >90 mL/min    GFR calc Af Amer 71 (*) >90 mL/min   CBC     Status: Abnormal   Collection Time   09/03/11  4:58 AM      Component Value Range Comment   WBC 13.7 (*) 4.0 - 10.5 K/uL    RBC 4.88  3.87 - 5.11 MIL/uL    Hemoglobin 13.2  12.0 - 15.0 g/dL    HCT 04.5  40.9 - 81.1 %    MCV 80.5  78.0 - 100.0 fL    MCH 27.0  26.0 - 34.0 pg    MCHC 33.6  30.0 - 36.0 g/dL    RDW 91.4  78.2 - 95.6 %    Platelets 276  150 - 400 K/uL  HEPATIC FUNCTION PANEL     Status: Normal   Collection Time   09/03/11  4:58 AM      Component Value  Range Comment   Total Protein 6.5  6.0 - 8.3 g/dL    Albumin 3.5  3.5 - 5.2 g/dL    AST 19  0 - 37 U/L    ALT 16  0 - 35 U/L    Alkaline Phosphatase 114  39 - 117 U/L    Total Bilirubin 0.5  0.3 - 1.2 mg/dL    Bilirubin, Direct 0.1  0.0 - 0.3 mg/dL    Indirect Bilirubin 0.4  0.3 - 0.9 mg/dL   MAGNESIUM     Status: Normal   Collection Time   09/03/11  4:58 AM      Component Value Range Comment   Magnesium 1.8  1.5 - 2.5 mg/dL     Ct Abdomen Pelvis W Contrast  09/03/2011  *RADIOLOGY REPORT*  Clinical Data: Abdominal pain.  Nausea vomiting.  Right breast cancer, status post mastectomy.  Diverticulitis.  Appendectomy.  CT ABDOMEN AND PELVIS WITH CONTRAST  Technique:  Multidetector CT imaging of the abdomen and pelvis was performed following the standard protocol during bolus administration of intravenous contrast.  Contrast: OMNIPAQUE IOHEXOL 300 MG/ML  SOLN  Comparison: Acute abdomen series of 1 day prior.  Abdominal pelvic CT of 01/01/2009.  Findings: 2 mm right lower lobe lung nodule is unchanged on image 10 of series 3.  Right mastectomy.  Mild cardiomegaly, without pericardial or pleural effusion.  A small hiatal hernia.  Suspect trace perihepatic fluid or peritoneal thickening on image 22. Focal steatosis adjacent the falciform ligament.  Normal spleen, remainder of stomach.  Normal pancreas, gallbladder, biliary tract, right adrenal gland.  The left adrenal is mildly nodular, unchanged.  Bilateral renal cysts and too small to characterize lesions.  No retroperitoneal or retrocrural adenopathy.  The colon is decompressed.  There is extensive sigmoid colon diverticulosis and muscular hypertrophy.  Normal terminal ileum.  The distal ileum is relatively decompressed.  The inflammation described on the prior is resolved. Focal transition identified at the mid ileum on image 72 of series 2.  There is dilatation of more proximal small bowel loops; 2.9 cm example.  There is mild mesenteric edema  within the dilated small bowel loops.  No wall thickening. No pneumatosis or free intraperitoneal air.  Relatively mild for age mesenteric atherosclerosis without arterial or venous thrombus.  No pelvic adenopathy.  Probable pelvic floor laxity.  Otherwise normal urinary bladder.  Hysterectomy.  No adnexal mass.  Trace cul- de-sac fluid on image 71.  Moderate osteopenia.  Trace L4-L5 anterolisthesis is likely degenerative.  IMPRESSION: Small bowel obstruction with transition in the mid ileum.  The transition could be related to scarring from prior enteritis or underlying adhesions.  There is a small volume mesenteric edema and abdominal pelvic ascites.  This is nonspecific.  Complicating ischemia cannot be entirely excluded.  Consider nasogastric tube decompression.  Original Report Authenticated By: Consuello Bossier, M.D.   Dg Abd Acute W/chest  09/02/2011  *RADIOLOGY REPORT*  Clinical Data: Abdominal pain with nausea vomiting.  ACUTE ABDOMEN SERIES (ABDOMEN 2 VIEW & CHEST 1 VIEW)  Comparison: Plain film chest of 09/21/2010.  Abdominal pelvic CT of 01/01/2009.  Findings: Patient rotated minimally to the right. Midline trachea. Mild cardiomegaly with atherosclerosis in the transverse aorta. No congestive failure.  Mild left base scarring.  Upright view of the abdomen demonstrates numerous air-fluid  levels in small bowel loops. No free intraperitoneal air.  Supine view demonstrates a stool within the ascending colon.  No significant bowel dilatation.  Distal gas.  Phleboliths in the pelvis.  IMPRESSION: Nonspecific bowel gas pattern.  Air fluid levels within nondilated small bowel loops.   Question low grade partial small bowel obstruction or gastroenteritis with adynamic ileus.  Cardiomegaly without congestive failure.  Original Report Authenticated By: Consuello Bossier, M.D.    Review of Systems  Unable to perform ROS: dementia  Gastrointestinal: Diarrhea: mild.   Blood pressure 123/52, pulse 66, temperature  97.9 F (36.6 C), temperature source Oral, resp. rate 18, height 5\' 3"  (1.6 m), weight 67.45 kg (148 lb 11.2 oz), SpO2 98.00%. Physical Exam  Constitutional: She appears well-developed. No distress.  HENT:  Head: Normocephalic and atraumatic.  Eyes: Conjunctivae and EOM are normal. Pupils are equal, round, and reactive to light. No scleral icterus.  Neck: Normal range of motion. Neck supple. No tracheal deviation present. No thyromegaly present.  Cardiovascular: Normal rate, regular rhythm and normal heart sounds.   Respiratory: Effort normal and breath sounds normal. No respiratory distress.  GI: Soft. She exhibits no distension and no mass. There is tenderness (mild diffuse tenderness. No peritoneal signs.). There is no rebound and no guarding.  Lymphadenopathy:    She has no cervical adenopathy.  Neurological:       Awake, confused. Patient is extremely poor historian.  Skin: Skin is warm and dry.    Assessment/Plan: Small bowel obstruction. At this point I would continue conservative management and aggressively hydrate the patient. Her blood work from today does appear as though she is hemoconcentrated and while she does have an elevation in her white blood cell count I do feel this is likely secondarily to the concentration affect. I do suspect she does have some degree of leukocytosis however given her physical exam findings and do not have any suspicion at this time that she has any intra-abdominal catastrophe's. Her appearance of her CT evaluation of the abdomen and pelvis is suspicious for a transition point a small intestine. I did discuss this finding with the patient and family. I explained that this may increase her chances of nonresolution with conservative management however at this time as I previously stated I feel conservative management is appropriate and what encouraged to do this through the weekend. Indications for urgent or emergent intervention were discussed. Prolonged  intervention was also discussion she not have any progression of her symptomatology. The family's questions and concerns were addressed. I will continue to follow the patient during hospitalization.  Lonya Johannesen C 09/03/2011, 4:54 PM

## 2011-09-03 NOTE — Progress Notes (Signed)
This is an assumption of care note. She was admitted last night with small bowel obstruction. She has an NG tube in and says that she feels better. She still has abdominal pain. She has previous episode of diverticulitis. I discussed the fact that she's going to need a surgical consultation and although she had a mastectomy done by Dr. Cyndia Bent in [place] she wants to stay in [place] for surgery as needed. I told her it is not a definite situation that she will require surgery but she does need surgical consultation and she understands that. She would like to see Dr. Dian Situ and I have discussed this with him. I will be out-of-town after approximately noon today and Dr. Delbert Harness will see her over the weekend

## 2011-09-03 NOTE — Progress Notes (Signed)
UR chart review completed.  

## 2011-09-04 DIAGNOSIS — K56609 Unspecified intestinal obstruction, unspecified as to partial versus complete obstruction: Secondary | ICD-10-CM | POA: Diagnosis not present

## 2011-09-04 DIAGNOSIS — E785 Hyperlipidemia, unspecified: Secondary | ICD-10-CM | POA: Diagnosis not present

## 2011-09-04 DIAGNOSIS — I10 Essential (primary) hypertension: Secondary | ICD-10-CM | POA: Diagnosis not present

## 2011-09-04 DIAGNOSIS — Q419 Congenital absence, atresia and stenosis of small intestine, part unspecified: Secondary | ICD-10-CM | POA: Diagnosis not present

## 2011-09-04 LAB — URINE CULTURE
Colony Count: >=100000
Culture  Setup Time: 201306132319

## 2011-09-04 LAB — MAGNESIUM: Magnesium: 1.8 mg/dL (ref 1.5–2.5)

## 2011-09-04 MED ORDER — SODIUM CHLORIDE 0.9 % IJ SOLN
INTRAMUSCULAR | Status: AC
  Administered 2011-09-04: 10 mL
  Filled 2011-09-04: qty 3

## 2011-09-04 NOTE — Progress Notes (Signed)
[id number] 

## 2011-09-04 NOTE — Progress Notes (Signed)
Subjective: Still with flatus. No nausea. No abdominal pain. Feels much improved.  Objective: Vital signs in last 24 hours: Temp:  [97.9 F (36.6 C)-98.9 F (37.2 C)] 98.2 F (36.8 C) (06/15 1026) Pulse Rate:  [60-73] 73  (06/15 1026) Resp:  [18] 18  (06/15 1026) BP: (123-145)/(52-67) 145/64 mmHg (06/15 1026) SpO2:  [92 %-98 %] 95 % (06/15 1026) Weight:  [63.368 kg (139 lb 11.2 oz)] 63.368 kg (139 lb 11.2 oz) (06/15 0409) Last BM Date: 09/02/11  Intake/Output from previous day: 06/14 0701 - 06/15 0700 In: 2787.5 [I.V.:2787.5] Out: 1050 [Urine:1050] Intake/Output this shift: Total I/O In: 0  Out: 225 [Urine:225]  General appearance: alert and no distress GI: Positive bowel sounds, soft, nontender. Nondistended.  Lab Results:   Basename 09/03/11 0458 09/02/11 2044  WBC 13.7* 15.3*  HGB 13.2 13.8  HCT 39.3 40.9  PLT 276 271   BMET  Basename 09/03/11 0458 09/02/11 2044  NA 136 137  K 3.9 3.7  CL 97 99  CO2 26 25  GLUCOSE 192* 151*  BUN 19 21  CREATININE 0.84 0.95  CALCIUM 9.4 9.9   PT/INR No results found for this basename: LABPROT:2,INR:2 in the last 72 hours ABG No results found for this basename: PHART:2,PCO2:2,PO2:2,HCO3:2 in the last 72 hours  Studies/Results: Ct Abdomen Pelvis W Contrast  09/03/2011  *RADIOLOGY REPORT*  Clinical Data: Abdominal pain.  Nausea vomiting.  Right breast cancer, status post mastectomy.  Diverticulitis.  Appendectomy.  CT ABDOMEN AND PELVIS WITH CONTRAST  Technique:  Multidetector CT imaging of the abdomen and pelvis was performed following the standard protocol during bolus administration of intravenous contrast.  Contrast: OMNIPAQUE IOHEXOL 300 MG/ML  SOLN  Comparison: Acute abdomen series of 1 day prior.  Abdominal pelvic CT of 01/01/2009.  Findings: 2 mm right lower lobe lung nodule is unchanged on image 10 of series 3.  Right mastectomy.  Mild cardiomegaly, without pericardial or pleural effusion.  A small hiatal  hernia.  Suspect trace perihepatic fluid or peritoneal thickening on image 22. Focal steatosis adjacent the falciform ligament.  Normal spleen, remainder of stomach.  Normal pancreas, gallbladder, biliary tract, right adrenal gland.  The left adrenal is mildly nodular, unchanged.  Bilateral renal cysts and too small to characterize lesions.  No retroperitoneal or retrocrural adenopathy.  The colon is decompressed.  There is extensive sigmoid colon diverticulosis and muscular hypertrophy.  Normal terminal ileum.  The distal ileum is relatively decompressed.  The inflammation described on the prior is resolved. Focal transition identified at the mid ileum on image 72 of series 2.  There is dilatation of more proximal small bowel loops; 2.9 cm example.  There is mild mesenteric edema within the dilated small bowel loops.  No wall thickening. No pneumatosis or free intraperitoneal air.  Relatively mild for age mesenteric atherosclerosis without arterial or venous thrombus.  No pelvic adenopathy.  Probable pelvic floor laxity.  Otherwise normal urinary bladder.  Hysterectomy.  No adnexal mass.  Trace cul- de-sac fluid on image 71.  Moderate osteopenia.  Trace L4-L5 anterolisthesis is likely degenerative.  IMPRESSION: Small bowel obstruction with transition in the mid ileum.  The transition could be related to scarring from prior enteritis or underlying adhesions.  There is a small volume mesenteric edema and abdominal pelvic ascites.  This is nonspecific.  Complicating ischemia cannot be entirely excluded.  Consider nasogastric tube decompression.  Original Report Authenticated By: Consuello Bossier, M.D.   Dg Abd Acute W/chest  09/02/2011  *  RADIOLOGY REPORT*  Clinical Data: Abdominal pain with nausea vomiting.  ACUTE ABDOMEN SERIES (ABDOMEN 2 VIEW & CHEST 1 VIEW)  Comparison: Plain film chest of 09/21/2010.  Abdominal pelvic CT of 01/01/2009.  Findings: Patient rotated minimally to the right. Midline trachea. Mild  cardiomegaly with atherosclerosis in the transverse aorta. No congestive failure.  Mild left base scarring.  Upright view of the abdomen demonstrates numerous air-fluid levels in small bowel loops. No free intraperitoneal air.  Supine view demonstrates a stool within the ascending colon.  No significant bowel dilatation.  Distal gas.  Phleboliths in the pelvis.  IMPRESSION: Nonspecific bowel gas pattern.  Air fluid levels within nondilated small bowel loops.   Question low grade partial small bowel obstruction or gastroenteritis with adynamic ileus.  Cardiomegaly without congestive failure.  Original Report Authenticated By: Consuello Bossier, M.D.    Anti-infectives: Anti-infectives     Start     Dose/Rate Route Frequency Ordered Stop   09/03/11 0400   metroNIDAZOLE (FLAGYL) IVPB 500 mg        500 mg 100 mL/hr over 60 Minutes Intravenous Every 8 hours 09/03/11 0218     09/03/11 0300   ciprofloxacin (CIPRO) IVPB 400 mg        400 mg 200 mL/hr over 60 Minutes Intravenous Every 12 hours 09/03/11 0218            Assessment/Plan: s/p * No surgery found * Partial small bowel obstruction. Her about her she appears to be resolving. At this time I did discuss with the patient advancement of diet slowly. Will start with clear liquids and see how patient tolerates this. Hopefully patient will continue to progress and will be able to consider for discharge in the next 24-48 hours. No acute surgical findings at this time.  LOS: 2 days    Raedyn Klinck C 09/04/2011

## 2011-09-04 NOTE — Progress Notes (Signed)
Caroline Aguirre, Caroline Aguirre NO.:  [id number]  MEDICAL RECORD NO.:  [id number]  LOCATION:  A301                          FACILITY:  [facility]  PHYSICIAN:  Melvyn Novas, MDDATE OF BIRTH:  September 08, 1924  DATE OF PROCEDURE: DATE OF DISCHARGE:                                PROGRESS NOTE   The patient has hypertension, hyperlipidemia and small bowel obstruction.  X-ray revealing multiple air-fluid levels of small bowel level.  The patient has been n.p.o. since admission.  She has had no further vomiting for 36 hours, currently given Cipro and Flagyl intravenously.  Blood pressure is 131/65, temperature 98.5, pulse is 66 and regular, respiratory rate is 18, O2 sat is 95%.  WBCs of 13.7, down from 15.3, hemoglobin 13.2.  CT of abdomen on the 14th reveals small bowel obstruction with transition in the mid ileum, possible scarring, underlying adhesions, small pelvic ascites and mesenteric edema.  PLAN:  Right now is to continue current antibiotics, keep her n.p.o.  We will consider advancing diet to clear liquids per surgery.  We will get BMET and CBC in a.m. as well as magnesium and hepatic panel.  Continue intravenous fluids and antibiotics.     Melvyn Novas, MD     RMD/MEDQ  D:  09/04/2011  T:  09/04/2011  Job:  [id number]

## 2011-09-05 DIAGNOSIS — I10 Essential (primary) hypertension: Secondary | ICD-10-CM | POA: Diagnosis not present

## 2011-09-05 DIAGNOSIS — E785 Hyperlipidemia, unspecified: Secondary | ICD-10-CM | POA: Diagnosis not present

## 2011-09-05 DIAGNOSIS — Q419 Congenital absence, atresia and stenosis of small intestine, part unspecified: Secondary | ICD-10-CM | POA: Diagnosis not present

## 2011-09-05 DIAGNOSIS — K56609 Unspecified intestinal obstruction, unspecified as to partial versus complete obstruction: Secondary | ICD-10-CM | POA: Diagnosis not present

## 2011-09-05 LAB — BASIC METABOLIC PANEL
Calcium: 8.8 mg/dL (ref 8.4–10.5)
GFR calc Af Amer: 87 mL/min — ABNORMAL LOW (ref >90)
GFR calc non Af Amer: 75 mL/min — ABNORMAL LOW (ref >90)
Potassium: 3.8 mEq/L (ref 3.5–5.1)
Sodium: 138 mEq/L (ref 135–145)

## 2011-09-05 LAB — CBC
MCH: 27.5 pg (ref 26.0–34.0)
Platelets: 187 10*3/uL (ref 150–400)
RBC: 4.37 MIL/uL (ref 3.87–5.11)
RDW: 14.5 % (ref 11.5–15.5)
WBC: 6.3 10*3/uL (ref 4.0–10.5)

## 2011-09-05 LAB — DIFFERENTIAL
Basophils Absolute: 0 10*3/uL (ref 0.0–0.1)
Basophils Relative: 0 % (ref 0–1)
Eosinophils Absolute: 0.1 10*3/uL (ref 0.0–0.7)
Lymphs Abs: 0.9 10*3/uL (ref 0.7–4.0)
Neutrophils Relative %: 72 % (ref 43–77)

## 2011-09-05 LAB — MAGNESIUM: Magnesium: 1.7 mg/dL (ref 1.5–2.5)

## 2011-09-05 MED ORDER — MAGNESIUM HYDROXIDE 400 MG/5ML PO SUSP
30.0000 mL | Freq: Every day | ORAL | Status: AC
  Administered 2011-09-05 – 2011-09-06 (×2): 30 mL via ORAL
  Filled 2011-09-05 (×2): qty 30

## 2011-09-05 MED ORDER — SODIUM CHLORIDE 0.9 % IJ SOLN
INTRAMUSCULAR | Status: AC
  Filled 2011-09-05: qty 3

## 2011-09-05 NOTE — Progress Notes (Signed)
Patient stated she had large bowel movement from milk of magnesia.  Patient has had no pain or nausea and vomiting.

## 2011-09-05 NOTE — Progress Notes (Signed)
Caroline Aguirre, Caroline Aguirre NO.:  [id number]  MEDICAL RECORD NO.:  [id number]  LOCATION:  A301                          FACILITY:  [facility]  PHYSICIAN:  Melvyn Novas, MDDATE OF BIRTH:  22-Jun-1924  DATE OF PROCEDURE: DATE OF DISCHARGE:                                PROGRESS NOTE   Patient has small-bowel obstruction with multiple air-fluid levels on admission and was started yesterday on clear liquids per surgery and is tolerated them well.  She has no nausea and no vomiting.  She does admit to some mild epigastric discomfort but is having flatus.  Lungs are clear.  Heart unremarkable.  Abdomen shows bowel sounds appeared to be hyperactive, no peristaltic rush is noted.  No guarding or rebound tenderness.  White count dropped from 13.7 to 6.3.  Liver panel appears fine.  Blood pressure 144/67, temperature 98.9, pulse is 70 regular, respiratory rate is 18.  PLAN:  Right now is continue clear liquid diet.  Observe for advance diet as tolerated per surgery, and if all goes well, consider discharge in 48 hours.     Melvyn Novas, MD     RMD/MEDQ  D:  09/05/2011  T:  09/05/2011  Job:  [id number]

## 2011-09-05 NOTE — Progress Notes (Signed)
  Subjective: Tolerating diet advancement. No nausea. Positive flatus. No dominant pain.  Objective: Vital signs in last 24 hours: Temp:  [98.3 F (36.8 C)-98.9 F (37.2 C)] 98.9 F (37.2 C) (06/16 1005) Pulse Rate:  [59-71] 70  (06/16 1005) Resp:  [18] 18  (06/16 1005) BP: (132-164)/(64-75) 144/67 mmHg (06/16 1005) SpO2:  [92 %-95 %] 95 % (06/16 1005) Weight:  [64.093 kg (141 lb 4.8 oz)] 64.093 kg (141 lb 4.8 oz) (06/16 0416) Last BM Date: 09/02/11  Intake/Output from previous day: 06/15 0701 - 06/16 0700 In: 4394.6 [P.O.:1405; I.V.:2989.6] Out: 2325 [Urine:2325] Intake/Output this shift: Total I/O In: 1000 [P.O.:1000] Out: 200 [Urine:200]  General appearance: alert and no distress GI: soft, non-tender; bowel sounds normal; no masses,  no organomegaly  Lab Results:   [facility] 09/05/11 0652 09/03/11 0458  WBC 6.3 13.7*  HGB 12.0 13.2  HCT 35.8* 39.3  PLT 187 276   BMET  Basename 09/05/11 0652 09/03/11 0458  NA 138 136  K 3.8 3.9  CL 106 97  CO2 26 26  GLUCOSE 103* 192*  BUN 8 19  CREATININE 0.75 0.84  CALCIUM 8.8 9.4   PT/INR No results found for this basename: LABPROT:2,INR:2 in the last 72 hours ABG No results found for this basename: PHART:2,PCO2:2,PO2:2,HCO3:2 in the last 72 hours  Studies/Results: No results found.  Anti-infectives: Anti-infectives     Start     Dose/Rate Route Frequency Ordered Stop   09/03/11 0400   metroNIDAZOLE (FLAGYL) IVPB 500 mg        500 mg 100 mL/hr over 60 Minutes Intravenous Every 8 hours 09/03/11 0218     09/03/11 0300   ciprofloxacin (CIPRO) IVPB 400 mg        400 mg 200 mL/hr over 60 Minutes Intravenous Every 12 hours 09/03/11 0218            Assessment/Plan: s/p * No surgery found * Partial small bowel obstruction. Appears to be improving. Patient has been advanced to a regular diet without difficulties. We'll trial some milk of magnesia to help with bowel stimulation. Continue ambulation. Hopeful  discharge tomorrow if doing well.  LOS: 3 days    Caroline Aguirre C 09/05/2011

## 2011-09-05 NOTE — Progress Notes (Signed)
[id number] 

## 2011-09-06 DIAGNOSIS — K56609 Unspecified intestinal obstruction, unspecified as to partial versus complete obstruction: Secondary | ICD-10-CM | POA: Diagnosis not present

## 2011-09-06 DIAGNOSIS — K5712 Diverticulitis of small intestine without perforation or abscess without bleeding: Secondary | ICD-10-CM | POA: Diagnosis not present

## 2011-09-06 LAB — BASIC METABOLIC PANEL
GFR calc Af Amer: 88 mL/min — ABNORMAL LOW (ref >90)
GFR calc non Af Amer: 76 mL/min — ABNORMAL LOW (ref >90)
Glucose, Bld: 127 mg/dL — ABNORMAL HIGH (ref 70–99)
Potassium: 3.8 mEq/L (ref 3.5–5.1)
Sodium: 142 mEq/L (ref 135–145)

## 2011-09-06 MED ORDER — SODIUM CHLORIDE 0.9 % IJ SOLN
INTRAMUSCULAR | Status: AC
  Administered 2011-09-06: 10 mL
  Filled 2011-09-06: qty 3

## 2011-09-06 NOTE — Progress Notes (Signed)
Aguirre, Caroline              ACCOUNT NO.:  [id number]  MEDICAL RECORD NO.:  [id number]  LOCATION:                                 FACILITY:  PHYSICIAN:  Marvina Danner D. Felecia Shelling, MD   DATE OF BIRTH:  05-14-1924  DATE OF PROCEDURE:  09/06/2011 DATE OF DISCHARGE:                                PROGRESS NOTE   SUBJECTIVE:  The patient feels much better.  She is tolerating her diet. She has moved her bowel.  No nausea or vomiting.  OBJECTIVE:  GENERAL: The patient is alert, awake, and resting. VITAL SIGNS: Blood pressure 133/77, pulse 70, respiratory rate 18, temperature 97.5 degrees Fahrenheit. CHEST: Clear lung fields.  Good air entry. CARDIOVASCULAR SYSTEM: First and second heart sounds heard.  No murmur. No gallop. ABDOMEN: Soft and lax.  Bowel sound is positive.  No mass or organomegaly. EXTREMITIES: No leg edema.  LABORATORY DATA:  BMP:  Sodium 142, potassium 3.8, chloride 109, carbon dioxide 26, glucose 127, BUN 7, creatinine 0.7, calcium 8.8.  ASSESSMENT: 1. Subacute bowel obstruction, clinically improving. 2. History of diverticulitis.  PLAN:  We will continue the patient on a regular diet.  We will continue supportive care.  If the patient continued to improve, we will plan to discharge her in the next 24 hours.     Nakota Elsen D. Felecia Shelling, MD     TDF/MEDQ  D:  09/06/2011  T:  09/06/2011  Job:  [id number]

## 2011-09-06 NOTE — Progress Notes (Signed)
  Subjective: Tolerating regular diet. Positive bowel meds. No abdominal pain. No nausea.  Objective: Vital signs in last 24 hours: Temp:  [97.5 F (36.4 C)-98.7 F (37.1 C)] 98.1 F (36.7 C) (06/17 1003) Pulse Rate:  [76-91] 80  (06/17 1003) Resp:  [18-20] 20  (06/17 1003) BP: (133-181)/(69-84) 144/69 mmHg (06/17 1003) SpO2:  [95 %-97 %] 95 % (06/17 1003) Weight:  [63.866 kg (140 lb 12.8 oz)-64.456 kg (142 lb 1.6 oz)] 63.866 kg (140 lb 12.8 oz) (06/17 0419) Last BM Date: 09/05/11  Intake/Output from previous day: 06/16 0701 - 06/17 0700 In: 4207.9 [P.O.:1110; I.V.:3097.9] Out: 2750 [Urine:2750] Intake/Output this shift: Total I/O In: 480 [P.O.:480] Out: 500 [Urine:500]  General appearance: alert and no distress GI: soft, non-tender; bowel sounds normal; no masses,  no organomegaly  Lab Results:   [facility] 09/05/11 0652  WBC 6.3  HGB 12.0  HCT 35.8*  PLT 187   BMET  Basename 09/06/11 0451 09/05/11 0652  NA 142 138  K 3.8 3.8  CL 109 106  CO2 26 26  GLUCOSE 127* 103*  BUN 7 8  CREATININE 0.73 0.75  CALCIUM 8.8 8.8   PT/INR No results found for this basename: LABPROT:2,INR:2 in the last 72 hours ABG No results found for this basename: PHART:2,PCO2:2,PO2:2,HCO3:2 in the last 72 hours  Studies/Results: No results found.  Anti-infectives: Anti-infectives     Start     Dose/Rate Route Frequency Ordered Stop   09/03/11 0400   metroNIDAZOLE (FLAGYL) IVPB 500 mg        500 mg 100 mL/hr over 60 Minutes Intravenous Every 8 hours 09/03/11 0218     09/03/11 0300   ciprofloxacin (CIPRO) IVPB 400 mg        400 mg 200 mL/hr over 60 Minutes Intravenous Every 12 hours 09/03/11 0218            Assessment/Plan: s/p * No surgery found * Partial small bowel obstruction. At this time clinically patient has resolved. Continued diet as tolerated. Okay to discharge today from my standpoint. We'll be available as needed. No acute surgical indications or issues.  LOS:  4 days    Halana Deisher C 09/06/2011

## 2011-09-07 DIAGNOSIS — K5712 Diverticulitis of small intestine without perforation or abscess without bleeding: Secondary | ICD-10-CM | POA: Diagnosis not present

## 2011-09-07 DIAGNOSIS — K56609 Unspecified intestinal obstruction, unspecified as to partial versus complete obstruction: Secondary | ICD-10-CM | POA: Diagnosis not present

## 2011-09-07 LAB — BASIC METABOLIC PANEL
BUN: 9 mg/dL (ref 6–23)
CO2: 24 mEq/L (ref 19–32)
Calcium: 8.3 mg/dL — ABNORMAL LOW (ref 8.4–10.5)
Creatinine, Ser: 0.68 mg/dL (ref 0.50–1.10)
Glucose, Bld: 101 mg/dL — ABNORMAL HIGH (ref 70–99)
Sodium: 140 mEq/L (ref 135–145)

## 2011-09-07 NOTE — Progress Notes (Signed)
States understanding of discharge. Discharged from room via wheelchair accompanied by staff and son.

## 2011-09-07 NOTE — Discharge Summary (Signed)
NAMECAMIE, Caroline Aguirre              ACCOUNT NO.:  [id number]  MEDICAL RECORD NO.:  [id number]  LOCATION:  A301                          FACILITY:  [facility]  PHYSICIAN:  Muhamad Serano D. Felecia Shelling, MD   DATE OF BIRTH:  17-Jan-1925  DATE OF ADMISSION:  09/02/2011 DATE OF DISCHARGE:  LH                              DISCHARGE SUMMARY   DISCHARGE DIAGNOSES: 1. Subacute bowel obstruction. 2. Gastroesophageal reflux disease. 3. Hypertension. 4. Hearing loss. 5. History of diverticulitis. 6. History of hyperlipidemia. 7. History of cerebrovascular accident.  DISCHARGE MEDICATIONS: 1. Amlodipine 10 mg p.o. daily. 2. Lipitor 20 mg daily. 3. Tums 750 chewable tablets b.i.d. 4. Methylcellulose 500 mg p.o. daily. 5. Plavix 75 mg daily. 6. Hyzaar 100/12.5 one tablet daily. 7. Lopressor 50 mg b.i.d. 8. Multivitamin 1 tablet p.o. daily. 9. Prilosec 20 mg daily. 10.Vitamin C 500 mg daily.  DISPOSITION:  The patient will be discharged home in a stable condition.  DISCHARGE INSTRUCTIONS:  The patient is advised to follow with Dr. Juanetta Gosling in 2 weeks time.  LABORATORY DATA ON DISCHARGE:  BMP; sodium 140, potassium 3.5, chloride 105, carbon dioxide 24, glucose 101, BUN 9, creatinine 0.8, calcium 8.3 and CBC.  WBC 8.3, hemoglobin 12.0, hematocrit 35.8 and platelets 187.  HOSPITAL COURSE:  This is an 78 year old female patient of Dr. Kari Baars admitted due to abdominal pain, nausea and vomiting.  She was found to have subacute bowel obstruction.  The patient was admitted and surgical consult was done.  The patient was treated conservatively and over hospital stay, her symptoms continued to resolve.  She was restarted on oral feeding.  Currently, she is moving her bowels and she is tolerating her diet.  The patient is going to be discharged home in a stable condition.     Giovannina Mun D. Felecia Shelling, MD     TDF/MEDQ  D:  09/07/2011  T:  09/07/2011  Job:  [id number]

## 2011-09-17 DIAGNOSIS — R262 Difficulty in walking, not elsewhere classified: Secondary | ICD-10-CM | POA: Diagnosis not present

## 2011-09-17 DIAGNOSIS — M6281 Muscle weakness (generalized): Secondary | ICD-10-CM | POA: Diagnosis not present

## 2011-09-17 DIAGNOSIS — Z7902 Long term (current) use of antithrombotics/antiplatelets: Secondary | ICD-10-CM | POA: Diagnosis not present

## 2011-09-17 DIAGNOSIS — I252 Old myocardial infarction: Secondary | ICD-10-CM | POA: Diagnosis not present

## 2011-09-17 DIAGNOSIS — Z8379 Family history of other diseases of the digestive system: Secondary | ICD-10-CM | POA: Diagnosis not present

## 2011-09-17 DIAGNOSIS — Z8673 Personal history of transient ischemic attack (TIA), and cerebral infarction without residual deficits: Secondary | ICD-10-CM | POA: Diagnosis not present

## 2011-09-17 DIAGNOSIS — I2510 Atherosclerotic heart disease of native coronary artery without angina pectoris: Secondary | ICD-10-CM | POA: Diagnosis not present

## 2011-09-17 DIAGNOSIS — Z853 Personal history of malignant neoplasm of breast: Secondary | ICD-10-CM | POA: Diagnosis not present

## 2011-09-17 DIAGNOSIS — K219 Gastro-esophageal reflux disease without esophagitis: Secondary | ICD-10-CM | POA: Diagnosis not present

## 2011-09-17 DIAGNOSIS — I10 Essential (primary) hypertension: Secondary | ICD-10-CM | POA: Diagnosis not present

## 2011-09-20 DIAGNOSIS — I10 Essential (primary) hypertension: Secondary | ICD-10-CM | POA: Diagnosis not present

## 2011-09-20 DIAGNOSIS — K56609 Unspecified intestinal obstruction, unspecified as to partial versus complete obstruction: Secondary | ICD-10-CM | POA: Diagnosis not present

## 2011-09-20 DIAGNOSIS — E785 Hyperlipidemia, unspecified: Secondary | ICD-10-CM | POA: Diagnosis not present

## 2011-09-20 DIAGNOSIS — I259 Chronic ischemic heart disease, unspecified: Secondary | ICD-10-CM | POA: Diagnosis not present

## 2011-09-20 DIAGNOSIS — I2510 Atherosclerotic heart disease of native coronary artery without angina pectoris: Secondary | ICD-10-CM | POA: Diagnosis not present

## 2011-09-20 DIAGNOSIS — K219 Gastro-esophageal reflux disease without esophagitis: Secondary | ICD-10-CM | POA: Diagnosis not present

## 2011-09-20 DIAGNOSIS — I252 Old myocardial infarction: Secondary | ICD-10-CM | POA: Diagnosis not present

## 2011-09-20 DIAGNOSIS — R262 Difficulty in walking, not elsewhere classified: Secondary | ICD-10-CM | POA: Diagnosis not present

## 2011-09-20 DIAGNOSIS — M6281 Muscle weakness (generalized): Secondary | ICD-10-CM | POA: Diagnosis not present

## 2011-09-21 DIAGNOSIS — R262 Difficulty in walking, not elsewhere classified: Secondary | ICD-10-CM | POA: Diagnosis not present

## 2011-09-21 DIAGNOSIS — I10 Essential (primary) hypertension: Secondary | ICD-10-CM | POA: Diagnosis not present

## 2011-09-21 DIAGNOSIS — I2510 Atherosclerotic heart disease of native coronary artery without angina pectoris: Secondary | ICD-10-CM | POA: Diagnosis not present

## 2011-09-21 DIAGNOSIS — I252 Old myocardial infarction: Secondary | ICD-10-CM | POA: Diagnosis not present

## 2011-09-21 DIAGNOSIS — K219 Gastro-esophageal reflux disease without esophagitis: Secondary | ICD-10-CM | POA: Diagnosis not present

## 2011-09-21 DIAGNOSIS — M6281 Muscle weakness (generalized): Secondary | ICD-10-CM | POA: Diagnosis not present

## 2011-09-22 DIAGNOSIS — R262 Difficulty in walking, not elsewhere classified: Secondary | ICD-10-CM | POA: Diagnosis not present

## 2011-09-22 DIAGNOSIS — M6281 Muscle weakness (generalized): Secondary | ICD-10-CM | POA: Diagnosis not present

## 2011-09-22 DIAGNOSIS — I252 Old myocardial infarction: Secondary | ICD-10-CM | POA: Diagnosis not present

## 2011-09-22 DIAGNOSIS — K219 Gastro-esophageal reflux disease without esophagitis: Secondary | ICD-10-CM | POA: Diagnosis not present

## 2011-09-22 DIAGNOSIS — I2510 Atherosclerotic heart disease of native coronary artery without angina pectoris: Secondary | ICD-10-CM | POA: Diagnosis not present

## 2011-09-22 DIAGNOSIS — I10 Essential (primary) hypertension: Secondary | ICD-10-CM | POA: Diagnosis not present

## 2011-09-29 DIAGNOSIS — M6281 Muscle weakness (generalized): Secondary | ICD-10-CM | POA: Diagnosis not present

## 2011-09-29 DIAGNOSIS — I10 Essential (primary) hypertension: Secondary | ICD-10-CM | POA: Diagnosis not present

## 2011-09-29 DIAGNOSIS — I2510 Atherosclerotic heart disease of native coronary artery without angina pectoris: Secondary | ICD-10-CM | POA: Diagnosis not present

## 2011-09-29 DIAGNOSIS — I252 Old myocardial infarction: Secondary | ICD-10-CM | POA: Diagnosis not present

## 2011-09-29 DIAGNOSIS — K219 Gastro-esophageal reflux disease without esophagitis: Secondary | ICD-10-CM | POA: Diagnosis not present

## 2011-09-29 DIAGNOSIS — R262 Difficulty in walking, not elsewhere classified: Secondary | ICD-10-CM | POA: Diagnosis not present

## 2011-09-30 DIAGNOSIS — I10 Essential (primary) hypertension: Secondary | ICD-10-CM | POA: Diagnosis not present

## 2011-09-30 DIAGNOSIS — M6281 Muscle weakness (generalized): Secondary | ICD-10-CM | POA: Diagnosis not present

## 2011-09-30 DIAGNOSIS — I252 Old myocardial infarction: Secondary | ICD-10-CM | POA: Diagnosis not present

## 2011-09-30 DIAGNOSIS — I2510 Atherosclerotic heart disease of native coronary artery without angina pectoris: Secondary | ICD-10-CM | POA: Diagnosis not present

## 2011-09-30 DIAGNOSIS — R262 Difficulty in walking, not elsewhere classified: Secondary | ICD-10-CM | POA: Diagnosis not present

## 2011-09-30 DIAGNOSIS — K219 Gastro-esophageal reflux disease without esophagitis: Secondary | ICD-10-CM | POA: Diagnosis not present

## 2011-10-06 DIAGNOSIS — M6281 Muscle weakness (generalized): Secondary | ICD-10-CM | POA: Diagnosis not present

## 2011-10-06 DIAGNOSIS — I252 Old myocardial infarction: Secondary | ICD-10-CM | POA: Diagnosis not present

## 2011-10-06 DIAGNOSIS — I2510 Atherosclerotic heart disease of native coronary artery without angina pectoris: Secondary | ICD-10-CM | POA: Diagnosis not present

## 2011-10-06 DIAGNOSIS — I10 Essential (primary) hypertension: Secondary | ICD-10-CM | POA: Diagnosis not present

## 2011-10-06 DIAGNOSIS — K219 Gastro-esophageal reflux disease without esophagitis: Secondary | ICD-10-CM | POA: Diagnosis not present

## 2011-10-06 DIAGNOSIS — R262 Difficulty in walking, not elsewhere classified: Secondary | ICD-10-CM | POA: Diagnosis not present

## 2011-10-08 DIAGNOSIS — I252 Old myocardial infarction: Secondary | ICD-10-CM | POA: Diagnosis not present

## 2011-10-08 DIAGNOSIS — I2510 Atherosclerotic heart disease of native coronary artery without angina pectoris: Secondary | ICD-10-CM | POA: Diagnosis not present

## 2011-10-08 DIAGNOSIS — K219 Gastro-esophageal reflux disease without esophagitis: Secondary | ICD-10-CM | POA: Diagnosis not present

## 2011-10-08 DIAGNOSIS — I10 Essential (primary) hypertension: Secondary | ICD-10-CM | POA: Diagnosis not present

## 2011-10-08 DIAGNOSIS — R262 Difficulty in walking, not elsewhere classified: Secondary | ICD-10-CM | POA: Diagnosis not present

## 2011-10-08 DIAGNOSIS — M6281 Muscle weakness (generalized): Secondary | ICD-10-CM | POA: Diagnosis not present

## 2011-10-13 DIAGNOSIS — M6281 Muscle weakness (generalized): Secondary | ICD-10-CM | POA: Diagnosis not present

## 2011-10-13 DIAGNOSIS — K219 Gastro-esophageal reflux disease without esophagitis: Secondary | ICD-10-CM | POA: Diagnosis not present

## 2011-10-13 DIAGNOSIS — R262 Difficulty in walking, not elsewhere classified: Secondary | ICD-10-CM | POA: Diagnosis not present

## 2011-10-13 DIAGNOSIS — I10 Essential (primary) hypertension: Secondary | ICD-10-CM | POA: Diagnosis not present

## 2011-10-13 DIAGNOSIS — I252 Old myocardial infarction: Secondary | ICD-10-CM | POA: Diagnosis not present

## 2011-10-13 DIAGNOSIS — I2510 Atherosclerotic heart disease of native coronary artery without angina pectoris: Secondary | ICD-10-CM | POA: Diagnosis not present

## 2011-10-15 DIAGNOSIS — R262 Difficulty in walking, not elsewhere classified: Secondary | ICD-10-CM | POA: Diagnosis not present

## 2011-10-15 DIAGNOSIS — M6281 Muscle weakness (generalized): Secondary | ICD-10-CM | POA: Diagnosis not present

## 2011-10-15 DIAGNOSIS — I252 Old myocardial infarction: Secondary | ICD-10-CM | POA: Diagnosis not present

## 2011-10-15 DIAGNOSIS — K219 Gastro-esophageal reflux disease without esophagitis: Secondary | ICD-10-CM | POA: Diagnosis not present

## 2011-10-15 DIAGNOSIS — I2510 Atherosclerotic heart disease of native coronary artery without angina pectoris: Secondary | ICD-10-CM | POA: Diagnosis not present

## 2011-10-15 DIAGNOSIS — I10 Essential (primary) hypertension: Secondary | ICD-10-CM | POA: Diagnosis not present

## 2011-10-26 DIAGNOSIS — E785 Hyperlipidemia, unspecified: Secondary | ICD-10-CM | POA: Diagnosis not present

## 2011-10-26 DIAGNOSIS — I10 Essential (primary) hypertension: Secondary | ICD-10-CM | POA: Diagnosis not present

## 2011-10-28 ENCOUNTER — Other Ambulatory Visit (HOSPITAL_COMMUNITY): Payer: Self-pay | Admitting: Pulmonary Disease

## 2011-10-28 DIAGNOSIS — I6789 Other cerebrovascular disease: Secondary | ICD-10-CM | POA: Diagnosis not present

## 2011-10-28 DIAGNOSIS — I639 Cerebral infarction, unspecified: Secondary | ICD-10-CM

## 2011-10-28 DIAGNOSIS — I259 Chronic ischemic heart disease, unspecified: Secondary | ICD-10-CM | POA: Diagnosis not present

## 2011-10-28 DIAGNOSIS — M545 Low back pain: Secondary | ICD-10-CM | POA: Diagnosis not present

## 2011-11-01 ENCOUNTER — Ambulatory Visit (HOSPITAL_COMMUNITY)
Admission: RE | Admit: 2011-11-01 | Discharge: 2011-11-01 | Disposition: A | Discharge status: Home / Self Care | Payer: Medicare Other | Source: Ambulatory Visit | Attending: Pulmonary Disease | Admitting: Pulmonary Disease

## 2011-11-01 DIAGNOSIS — I6529 Occlusion and stenosis of unspecified carotid artery: Secondary | ICD-10-CM | POA: Diagnosis not present

## 2011-11-01 DIAGNOSIS — I639 Cerebral infarction, unspecified: Secondary | ICD-10-CM

## 2011-11-01 DIAGNOSIS — I6350 Cerebral infarction due to unspecified occlusion or stenosis of unspecified cerebral artery: Secondary | ICD-10-CM | POA: Diagnosis not present

## 2011-11-01 DIAGNOSIS — I658 Occlusion and stenosis of other precerebral arteries: Secondary | ICD-10-CM | POA: Diagnosis not present

## 2011-11-02 ENCOUNTER — Encounter (HOSPITAL_COMMUNITY): Payer: Self-pay | Admitting: Oncology

## 2011-11-02 ENCOUNTER — Encounter (HOSPITAL_COMMUNITY): Payer: Medicare Other | Attending: Oncology | Admitting: Oncology

## 2011-11-02 VITALS — BP 154/62 | HR 55 | Temp 97.6°F | Resp 16 | Wt 130.8 lb

## 2011-11-02 DIAGNOSIS — Z9010 Acquired absence of unspecified breast and nipple: Secondary | ICD-10-CM | POA: Diagnosis not present

## 2011-11-02 DIAGNOSIS — Z171 Estrogen receptor negative status [ER-]: Secondary | ICD-10-CM | POA: Diagnosis not present

## 2011-11-02 DIAGNOSIS — Z853 Personal history of malignant neoplasm of breast: Secondary | ICD-10-CM

## 2011-11-02 DIAGNOSIS — D0590 Unspecified type of carcinoma in situ of unspecified breast: Secondary | ICD-10-CM | POA: Diagnosis not present

## 2011-11-02 NOTE — Progress Notes (Signed)
Fredirick Maudlin, MD [address] [place] [address]  1. Hx Breast cancer, DCIS, Right     CURRENT THERAPY: Observation  INTERVAL HISTORY: Caroline Aguirre 78 y.o. 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 It was estrogen receptor-negative, progesterone receptor-negative, and she underwent mastectomy by Dr. Jamey Ripa on 09/25/2010.  Unfortunately, Caroline Aguirre was in the hospital with a small bowel obstruction.  She was admitted to [facility] from 09/03/2011- 09/07/2011.  Surgery was consulted, but the patient was treated conservatively.   Caroline Aguirre denies any complaints.  She is overdue for a screening mammogram so she is agreeable to have this done this month or next.  Information was provided to her regarding getting this screening performed.   Otherwise, she denies any complaints.  Recently, she underwent dopplers of B/L carotids which did reveal stenosis, but not significant.  I will let her follow-up with her PCP, Dr. Juanetta Gosling regarding this test.  He is the ordering provider.    Oncologically, her ROS is negative.     Past Medical History  Diagnosis Date  . Herniated disc   . Cyst of breast     Right - at 56 yrs of age.  . Shingles   . Hardening of the arteries of the brain   . Stroke 2004    cerebral hemmorhage  . Diverticulitis   . Arthritis   . 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 Hx Breast cancer, DCIS, Right 08/19/2010  . Migraine headache   . SBO (small bowel obstruction) 09/02/11    has Herniated disc; Shingles; Hardening of the arteries of the brain; Stroke; Cyst of breast, right, solitary; Fatigue/loss of sleep; High blood pressure; Bronchitis; Asthma; Skin moles (abnormal); Gastroesophageal reflux disease with hiatal hernia; Hearing loss; Wears glasses and/or contacts; Glaucoma; Menopause; Hx  Breast cancer, DCIS, Right; and SBO (small bowel obstruction) on her problem list.     is allergic to tape; codeine; and morphine and related.  Ms. Yasin does not currently have medications on file.  Past Surgical History  Procedure Date  . Brain surgery     hemorrhage  . Cystectomy   . Spine surgery     repair herniated disc  . Fibroid tumors   . Breast surgery 09/25/2010    mastectomy    Denies any headaches, dizziness, double vision, fevers, chills, night sweats, nausea, vomiting, diarrhea, constipation, chest pain, heart palpitations, shortness of breath, blood in stool, black tarry stool, urinary pain, urinary burning, urinary frequency, hematuria.   PHYSICAL EXAMINATION  ECOG PERFORMANCE STATUS: 1 - Symptomatic but completely ambulatory  Filed Vitals:   11/02/11 1157  BP: 154/62  Pulse: 55  Temp: 97.6 F (36.4 C)  Resp: 16    GENERAL:alert, no distress, well nourished, well developed, comfortable, cooperative and smiling SKIN: skin color, texture, turgor are normal, no rashes or significant lesions HEAD: Normocephalic, No masses, lesions, tenderness or abnormalities EYES: normal, Conjunctiva are pink and non-injected EARS: External ears normal OROPHARYNX:lips, buccal mucosa, and tongue normal and mucous membranes are moist  NECK: supple, no adenopathy, trachea midline LYMPH:  no palpable lymphadenopathy, no hepatosplenomegaly BREAST:left breast normal without mass, skin or nipple changes or axillary nodes, right post-mastectomy site well healed and free of suspicious changes LUNGS: clear to auscultation and percussion HEART: regular rate &  rhythm, no murmurs, no gallops, S1 normal and S2 normal ABDOMEN:abdomen soft, non-tender, normal bowel sounds, no masses or organomegaly and no hepatosplenomegaly BACK: Back symmetric, no curvature. EXTREMITIES:less then 2 second capillary refill, no joint deformities, effusion, or inflammation, no edema, no skin discoloration, no  clubbing, no cyanosis  NEURO: alert & oriented x 3 with fluent speech, no focal motor/sensory deficits, gait normal    LABORATORY DATA: Results for CHRISELDA, LEPPERT (MRN [id number]) as of 11/02/2011 12:10  Ref. Range 09/05/2011 06:52  Sodium Latest Range: 135-145 mEq/L 138  Potassium Latest Range: 3.5-5.1 mEq/L 3.8  Chloride Latest Range: 96-112 mEq/L 106  CO2 Latest Range: 19-32 mEq/L 26  BUN Latest Range: 6-23 mg/dL 8  Creat Latest Range: 0.50-1.10 mg/dL 7.82  Calcium Latest Range: 8.4-10.5 mg/dL 8.8  GFR calc non Af Amer Latest Range: >90 mL/min 75 (L)  GFR calc Af Amer Latest Range: >90 mL/min 87 (L)  Glucose Latest Range: 70-99 mg/dL 956 (H)  Magnesium Latest Range: 1.5-2.5 mg/dL 1.7  WBC Latest Range: 4.0-10.5 K/uL 6.3  RBC Latest Range: 3.87-5.11 MIL/uL 4.37  Hemoglobin Latest Range: 12.0-15.0 g/dL 21.3  HCT Latest Range: 36.0-46.0 % 35.8 (L)  MCV Latest Range: 78.0-100.0 fL 81.9  MCH Latest Range: 26.0-34.0 pg 27.5  MCHC Latest Range: 30.0-36.0 g/dL 08.6  RDW Latest Range: 11.5-15.5 % 14.5  Platelets Latest Range: 150-400 K/uL 187  Neutrophils Relative Latest Range: 43-77 % 72  Lymphocytes Relative Latest Range: 12-46 % 14  Monocytes Relative Latest Range: 3-12 % 12  Eosinophils Relative Latest Range: 0-5 % 2  Basophils Relative Latest Range: 0-1 % 0  NEUT# Latest Range: 1.7-7.7 K/uL 4.5  Lymphocytes Absolute Latest Range: 0.7-4.0 K/uL 0.9  Monocytes Absolute Latest Range: 0.1-1.0 K/uL 0.7  Eosinophils Absolute Latest Range: 0.0-0.7 K/uL 0.1  Basophils Absolute Latest Range: 0.0-0.1 K/uL 0.0      PATHOLOGY: 09/25/2010  Diagnosis Breast, simple mastectomy, Right - HIGH GRADE DUCTAL CARCINOMA IN SITU WITH ASSOCIATED COMEDONECROSIS, 8.2 CM. - RESECTION MARGINS ARE CLEAR. - HYALINIZED NODULE WITH ASSOCIATED DYSTROPHIC CALCIFICATION. - TWO LYMPH NODES, NEGATIVE FOR METASTATIC CARCINOMA (0/2). Microscopic Comment BREAST, IN SITU CARCINOMA Specimen, including  laterality: Right breast. Procedure: Simple mastectomy. Lymph node sampling performed: Yes. Estimated tumor size (gross measurement): 8.2 cm. Histologic type: Ductal carcinoma in situ with comedonecrosis. Nuclear grade: High grade. Necrosis: Present. Treatment effect (if treated with neoadjuvant therapy): No. Distance to closest margin: Deep margin: 1 cm. Breast Prognostic Profile: Estrogen receptor: 0%, negative. Progesterone receptor: 0%, negative. ER/PR will be repeated and addendum report will follow. Axillary lymph nodes: Number examined: 2. Number with metastasis: 0. ITC (isolated tumor cells, < 0.2 mm): 0. Micrometastasis (> 0.49mm, < 2mm): 0. Metastasis > 2mm: 0. Extracapsular extension: 0. 1 of 3 FINAL for Fike, Taleigha C ([id number]) Microscopic Comment(continued) Method of detection of metastases: H&E only. TNM: pTis, pN0. Comment: Immunostains for CK AE1/AE3 and calponin were performed and the stains confirm the above diagnosis. Abigail Miyamoto MD Pathologist, Electronic Signature (Case signed 09/29/2010)     ASSESSMENT:  1. 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 It was estrogen receptor-negative, progesterone receptor-negative, and she underwent mastectomy by Dr. Jamey Ripa on 09/25/2010.   PLAN:  1. I personally reviewed and went over laboratory results with the patient. 2. I personally reviewed and went over radiographic studies with the patient. 3. Lab work in 1 year: CBC diff, CMET 4. Recommend screening mammography in the  near future.  Information regarding getting this appointment scheduled provided to the patient.  5. Return in 1 year for follow-up.    All questions were answered. The patient knows to call the clinic with any problems, questions or concerns. We can certainly see the patient much sooner if necessary.  KEFALAS,THOMAS

## 2011-11-02 NOTE — Patient Instructions (Addendum)
Caroline Aguirre  [id number] [birth year] Dr. Glenford Peers   [facility]  Discharge Instructions  RECOMMENDATIONS MADE BY THE CONSULTANT AND ANY TEST RESULTS WILL BE SENT TO YOUR REFERRING DOCTOR.   EXAM FINDINGS BY MD TODAY AND SIGNS AND SYMPTOMS TO REPORT TO CLINIC OR PRIMARY MD: No evidence of recurrence by exam.  Report any new lumps, bone pain or shortness of breath.  MEDICATIONS PRESCRIBED: none      SPECIAL INSTRUCTIONS/FOLLOW-UP: Return to Clinic in 1 year to see MD.   I acknowledge that I have been informed and understand all the instructions given to me and received a copy. I do not have any more questions at this time, but understand that I may call the Specialty Clinic at [facility] at [phone] during business hours should I have any further questions or need assistance in obtaining follow-up care.    __________________________________________  _____________  __________ Signature of Patient or Authorized Representative            Date                   Time    __________________________________________ Nurse's Signature

## 2011-11-19 ENCOUNTER — Other Ambulatory Visit (HOSPITAL_COMMUNITY): Payer: Self-pay | Admitting: Oncology

## 2011-11-19 DIAGNOSIS — C50919 Malignant neoplasm of unspecified site of unspecified female breast: Secondary | ICD-10-CM

## 2011-12-01 ENCOUNTER — Ambulatory Visit (HOSPITAL_COMMUNITY)
Admission: RE | Admit: 2011-12-01 | Discharge: 2011-12-01 | Disposition: A | Discharge status: Home / Self Care | Payer: Medicare Other | Source: Ambulatory Visit | Attending: Oncology | Admitting: Oncology

## 2011-12-01 ENCOUNTER — Other Ambulatory Visit (HOSPITAL_COMMUNITY): Payer: Self-pay | Admitting: Oncology

## 2011-12-01 DIAGNOSIS — Z1231 Encounter for screening mammogram for malignant neoplasm of breast: Secondary | ICD-10-CM | POA: Insufficient documentation

## 2011-12-01 DIAGNOSIS — C50919 Malignant neoplasm of unspecified site of unspecified female breast: Secondary | ICD-10-CM

## 2011-12-28 DIAGNOSIS — Z23 Encounter for immunization: Secondary | ICD-10-CM | POA: Diagnosis not present

## 2011-12-29 DIAGNOSIS — H4010X0 Unspecified open-angle glaucoma, stage unspecified: Secondary | ICD-10-CM | POA: Diagnosis not present

## 2012-01-26 DIAGNOSIS — C50919 Malignant neoplasm of unspecified site of unspecified female breast: Secondary | ICD-10-CM | POA: Diagnosis not present

## 2012-01-26 DIAGNOSIS — I10 Essential (primary) hypertension: Secondary | ICD-10-CM | POA: Diagnosis not present

## 2012-01-26 DIAGNOSIS — I259 Chronic ischemic heart disease, unspecified: Secondary | ICD-10-CM | POA: Diagnosis not present

## 2012-01-26 DIAGNOSIS — E785 Hyperlipidemia, unspecified: Secondary | ICD-10-CM | POA: Diagnosis not present

## 2012-02-01 DIAGNOSIS — N898 Other specified noninflammatory disorders of vagina: Secondary | ICD-10-CM | POA: Diagnosis not present

## 2012-02-10 ENCOUNTER — Emergency Department (HOSPITAL_COMMUNITY)
Admission: EM | Admit: 2012-02-10 | Discharge: 2012-02-10 | Disposition: A | Discharge status: Home / Self Care | Payer: Medicare Other | Attending: Emergency Medicine | Admitting: Emergency Medicine

## 2012-02-10 ENCOUNTER — Encounter (HOSPITAL_COMMUNITY): Payer: Self-pay | Admitting: Emergency Medicine

## 2012-02-10 DIAGNOSIS — Z8673 Personal history of transient ischemic attack (TIA), and cerebral infarction without residual deficits: Secondary | ICD-10-CM | POA: Insufficient documentation

## 2012-02-10 DIAGNOSIS — K5732 Diverticulitis of large intestine without perforation or abscess without bleeding: Secondary | ICD-10-CM | POA: Diagnosis not present

## 2012-02-10 DIAGNOSIS — Z8619 Personal history of other infectious and parasitic diseases: Secondary | ICD-10-CM | POA: Insufficient documentation

## 2012-02-10 DIAGNOSIS — Z79899 Other long term (current) drug therapy: Secondary | ICD-10-CM | POA: Insufficient documentation

## 2012-02-10 DIAGNOSIS — J449 Chronic obstructive pulmonary disease, unspecified: Secondary | ICD-10-CM | POA: Diagnosis not present

## 2012-02-10 DIAGNOSIS — M129 Arthropathy, unspecified: Secondary | ICD-10-CM | POA: Diagnosis not present

## 2012-02-10 DIAGNOSIS — M6281 Muscle weakness (generalized): Secondary | ICD-10-CM | POA: Diagnosis not present

## 2012-02-10 DIAGNOSIS — M25569 Pain in unspecified knee: Secondary | ICD-10-CM | POA: Diagnosis not present

## 2012-02-10 DIAGNOSIS — Z8679 Personal history of other diseases of the circulatory system: Secondary | ICD-10-CM | POA: Diagnosis not present

## 2012-02-10 DIAGNOSIS — Z853 Personal history of malignant neoplasm of breast: Secondary | ICD-10-CM | POA: Diagnosis not present

## 2012-02-10 DIAGNOSIS — R197 Diarrhea, unspecified: Secondary | ICD-10-CM | POA: Diagnosis not present

## 2012-02-10 DIAGNOSIS — Z9181 History of falling: Secondary | ICD-10-CM | POA: Diagnosis not present

## 2012-02-10 DIAGNOSIS — I69998 Other sequelae following unspecified cerebrovascular disease: Secondary | ICD-10-CM | POA: Diagnosis not present

## 2012-02-10 DIAGNOSIS — I10 Essential (primary) hypertension: Secondary | ICD-10-CM | POA: Diagnosis not present

## 2012-02-10 LAB — BASIC METABOLIC PANEL
Calcium: 9.4 mg/dL (ref 8.4–10.5)
GFR calc non Af Amer: 59 mL/min — ABNORMAL LOW (ref >90)
Glucose, Bld: 122 mg/dL — ABNORMAL HIGH (ref 70–99)
Sodium: 140 mEq/L (ref 135–145)

## 2012-02-10 LAB — CBC WITH DIFFERENTIAL/PLATELET
Basophils Absolute: 0 10*3/uL (ref 0.0–0.1)
Basophils Relative: 0 % (ref 0–1)
Eosinophils Absolute: 0.1 10*3/uL (ref 0.0–0.7)
Eosinophils Relative: 1 % (ref 0–5)
Lymphs Abs: 1.7 10*3/uL (ref 0.7–4.0)
MCH: 28.1 pg (ref 26.0–34.0)
MCHC: 34.1 g/dL (ref 30.0–36.0)
MCV: 82.4 fL (ref 78.0–100.0)
Platelets: 264 10*3/uL (ref 150–400)
RDW: 13.5 % (ref 11.5–15.5)

## 2012-02-10 MED ORDER — DIPHENOXYLATE-ATROPINE 2.5-0.025 MG PO TABS
2.0000 | ORAL_TABLET | ORAL | Status: AC
  Administered 2012-02-10: 2 via ORAL
  Filled 2012-02-10: qty 2

## 2012-02-10 MED ORDER — DIPHENOXYLATE-ATROPINE 2.5-0.025 MG PO TABS: 1.0000 | ORAL_TABLET | Freq: Four times a day (QID) | ORAL | Status: DC | PRN

## 2012-02-10 NOTE — ED Provider Notes (Signed)
History     CSN: [id number]  Arrival date & time 02/10/12  0414   First MD Initiated Contact with Patient 02/10/12 [phone]      Chief Complaint  Patient presents with  . Diarrhea    (Consider location/radiation/quality/duration/timing/severity/associated sxs/prior treatment) HPI Comments: Pt presents with acute onset of dark colored loose stools she classifies. As diarrhea.  She denies fevers, chills, cough, sob, back pain, cp, or rashes / swelling.  She has hx of a colonoscopy and a history of diverticulitis in the past. She had no episodes of nausea or vomiting, states that she has no abdominal pain. She classifies her stools as loose to watery, has had approximately 8 or 9 episodes of this since 7 hours ago when she had onset. She denies any history of rectal bleeding but has had an episode of intracranial hemorrhage and heart attack in the past. She currently takes Plavix  Patient is a 78 y.o. female presenting with diarrhea. The history is provided by the patient.  Diarrhea The primary symptoms include diarrhea.    Past Medical History  Diagnosis Date  . Herniated disc   . Cyst of breast     Right - at 58 yrs of age.  . Shingles   . Hardening of the arteries of the brain   . Stroke 2004    cerebral hemmorhage  . Diverticulitis   . Arthritis   . 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 Hx Breast cancer, DCIS, Right 08/19/2010  . Migraine headache   . SBO (small bowel obstruction) 09/02/11    Past Surgical History  Procedure Date  . Brain surgery     hemorrhage  . Cystectomy   . Spine surgery     repair herniated disc  . Fibroid tumors   . Breast surgery 09/25/2010    mastectomy    Family History  Problem Relation Age of Onset  . Cancer Mother   . Heart disease Father   . Spinal muscular atrophy Sister     spinal meningitis    History  Substance Use Topics  . Smoking status: Never Smoker   . Smokeless tobacco: Never  Used  . Alcohol Use: No    OB History    Grav Para Term Preterm Abortions TAB SAB Ect Mult Living                  Review of Systems  Gastrointestinal: Positive for diarrhea.  All other systems reviewed and are negative.    Allergies  Tape; Codeine; and Morphine and related  Home Medications   Current Outpatient Rx  Name  Route  Sig  Dispense  Refill  . AMLODIPINE BESYLATE 10 MG PO TABS   Oral   Take 10 mg by mouth every morning.          . ATORVASTATIN CALCIUM 20 MG PO TABS   Oral   Take 20 mg by mouth every morning.          Marland Kitchen CALCIUM CARBONATE ANTACID 750 MG PO CHEW   Oral   Chew 1 tablet by mouth 2 (two) times daily.          Marland Kitchen CLOPIDOGREL BISULFATE 75 MG PO TABS   Oral   Take 75 mg by mouth every morning.          Marland Kitchen DIPHENOXYLATE-ATROPINE 2.5-0.025 MG PO TABS   Oral   Take 1 tablet by mouth 4 (  four) times daily as needed for diarrhea or loose stools.   30 tablet   0   . LOSARTAN POTASSIUM-HCTZ 100-12.5 MG PO TABS   Oral   Take 1 tablet by mouth every morning.          . METHYLCELLULOSE (LAXATIVE) 500 MG PO TABS   Oral   Take 1 tablet by mouth daily.          Marland Kitchen METOPROLOL TARTRATE 50 MG PO TABS   Oral   Take 50 mg by mouth 2 (two) times daily.           . ADULT MULTIVITAMIN W/MINERALS CH   Oral   Take 1 tablet by mouth every morning. *Contains NO Iron (Ferrous Sulfate and/or Iodine)*         . OMEPRAZOLE 20 MG PO CPDR   Oral   Take 20 mg by mouth every morning.          Marland Kitchen VITAMIN C 500 MG PO TABS   Oral   Take 500 mg by mouth every morning.            BP 174/60  Pulse 62  Temp 97.7 F (36.5 C) (Oral)  Resp 14  Ht 5\' 4"  (1.626 m)  Wt 135 lb (61.236 kg)  BMI 23.17 kg/m2  SpO2 99%  Physical Exam  Nursing note and vitals reviewed. Constitutional: She appears well-developed and well-nourished. No distress.  HENT:  Head: Normocephalic and atraumatic.  Mouth/Throat: Oropharynx is clear and moist. No oropharyngeal  exudate.  Eyes: Conjunctivae normal and EOM are normal. Pupils are equal, round, and reactive to light. Right eye exhibits no discharge. Left eye exhibits no discharge. No scleral icterus.  Neck: Normal range of motion. Neck supple. No JVD present. No thyromegaly present.  Cardiovascular: Normal rate, regular rhythm, normal heart sounds and intact distal pulses.  Exam reveals no gallop and no friction rub.   No murmur heard. Pulmonary/Chest: Effort normal and breath sounds normal. No respiratory distress. She has no wheezes. She has no rales.  Abdominal: Soft. Bowel sounds are normal. She exhibits no distension and no mass. There is no tenderness.       Soft benign abdomen with normal bowel sounds  Genitourinary:       Chaperone present for rectal exam. No fissures or hemorrhoids present, no masses in the rectum on digital exam, liquid green stool present in the rectal vault which is Hemoccult negative on my exam and interpretation  Musculoskeletal: Normal range of motion. She exhibits no edema and no tenderness.  Lymphadenopathy:    She has no cervical adenopathy.  Neurological: She is alert. Coordination normal.  Skin: Skin is warm and dry. No rash noted. No erythema.  Psychiatric: She has a normal mood and affect. Her behavior is normal.    ED Course  Procedures (including critical care time)  Labs Reviewed  BASIC METABOLIC PANEL - Abnormal; Notable for the following:    Potassium 3.4 (*)     Glucose, Bld 122 (*)     GFR calc non Af Amer 59 (*)     GFR calc Af Amer 68 (*)     All other components within normal limits  CBC WITH DIFFERENTIAL   No results found.   1. Diarrhea       MDM  The patient has slight hypertension but no tachycardia, no fever and does not appear ill. She is not nauseated, not vomiting and has no abdominal tenderness. At this time I  suspect that the patient has a gastrointestinal illness causing diarrhea which is likely benign at this time. She denies  travel, recent antibiotic use or hospitalizations and has not had chronic problems with diarrhea. Will check basic labs, anticipate discharge home. She is tolerating oral fluids at this time.   CBC and basic metabolic panel essentially normal, patient stable for discharge, Lomotil given prior to discharge.     Vida Roller, MD 02/10/12 [phone]

## 2012-02-10 NOTE — ED Notes (Signed)
Patient complaining of diarrhea with onset being last night. Reports black stool, also reports taking Pepto bismol.

## 2012-02-15 DIAGNOSIS — R197 Diarrhea, unspecified: Secondary | ICD-10-CM | POA: Diagnosis not present

## 2012-02-15 DIAGNOSIS — M6281 Muscle weakness (generalized): Secondary | ICD-10-CM | POA: Diagnosis not present

## 2012-02-15 DIAGNOSIS — I69998 Other sequelae following unspecified cerebrovascular disease: Secondary | ICD-10-CM | POA: Diagnosis not present

## 2012-02-15 DIAGNOSIS — M25569 Pain in unspecified knee: Secondary | ICD-10-CM | POA: Diagnosis not present

## 2012-02-15 DIAGNOSIS — I10 Essential (primary) hypertension: Secondary | ICD-10-CM | POA: Diagnosis not present

## 2012-02-15 DIAGNOSIS — J449 Chronic obstructive pulmonary disease, unspecified: Secondary | ICD-10-CM | POA: Diagnosis not present

## 2012-02-16 DIAGNOSIS — I69998 Other sequelae following unspecified cerebrovascular disease: Secondary | ICD-10-CM | POA: Diagnosis not present

## 2012-02-16 DIAGNOSIS — M6281 Muscle weakness (generalized): Secondary | ICD-10-CM | POA: Diagnosis not present

## 2012-02-16 DIAGNOSIS — J449 Chronic obstructive pulmonary disease, unspecified: Secondary | ICD-10-CM | POA: Diagnosis not present

## 2012-02-16 DIAGNOSIS — I10 Essential (primary) hypertension: Secondary | ICD-10-CM | POA: Diagnosis not present

## 2012-02-16 DIAGNOSIS — M25569 Pain in unspecified knee: Secondary | ICD-10-CM | POA: Diagnosis not present

## 2012-02-16 DIAGNOSIS — R197 Diarrhea, unspecified: Secondary | ICD-10-CM | POA: Diagnosis not present

## 2012-02-21 DIAGNOSIS — Z90710 Acquired absence of both cervix and uterus: Secondary | ICD-10-CM | POA: Diagnosis not present

## 2012-02-21 DIAGNOSIS — N952 Postmenopausal atrophic vaginitis: Secondary | ICD-10-CM | POA: Diagnosis not present

## 2012-02-21 DIAGNOSIS — N816 Rectocele: Secondary | ICD-10-CM | POA: Diagnosis not present

## 2012-02-21 DIAGNOSIS — Z1212 Encounter for screening for malignant neoplasm of rectum: Secondary | ICD-10-CM | POA: Diagnosis not present

## 2012-02-22 DIAGNOSIS — J449 Chronic obstructive pulmonary disease, unspecified: Secondary | ICD-10-CM | POA: Diagnosis not present

## 2012-02-22 DIAGNOSIS — I69998 Other sequelae following unspecified cerebrovascular disease: Secondary | ICD-10-CM | POA: Diagnosis not present

## 2012-02-22 DIAGNOSIS — I10 Essential (primary) hypertension: Secondary | ICD-10-CM | POA: Diagnosis not present

## 2012-02-22 DIAGNOSIS — R197 Diarrhea, unspecified: Secondary | ICD-10-CM | POA: Diagnosis not present

## 2012-02-22 DIAGNOSIS — M6281 Muscle weakness (generalized): Secondary | ICD-10-CM | POA: Diagnosis not present

## 2012-02-22 DIAGNOSIS — M25569 Pain in unspecified knee: Secondary | ICD-10-CM | POA: Diagnosis not present

## 2012-02-24 DIAGNOSIS — I10 Essential (primary) hypertension: Secondary | ICD-10-CM | POA: Diagnosis not present

## 2012-02-24 DIAGNOSIS — M6281 Muscle weakness (generalized): Secondary | ICD-10-CM | POA: Diagnosis not present

## 2012-02-24 DIAGNOSIS — M25569 Pain in unspecified knee: Secondary | ICD-10-CM | POA: Diagnosis not present

## 2012-02-24 DIAGNOSIS — J449 Chronic obstructive pulmonary disease, unspecified: Secondary | ICD-10-CM | POA: Diagnosis not present

## 2012-02-24 DIAGNOSIS — R197 Diarrhea, unspecified: Secondary | ICD-10-CM | POA: Diagnosis not present

## 2012-02-24 DIAGNOSIS — I69998 Other sequelae following unspecified cerebrovascular disease: Secondary | ICD-10-CM | POA: Diagnosis not present

## 2012-02-28 DIAGNOSIS — J449 Chronic obstructive pulmonary disease, unspecified: Secondary | ICD-10-CM | POA: Diagnosis not present

## 2012-02-28 DIAGNOSIS — I10 Essential (primary) hypertension: Secondary | ICD-10-CM | POA: Diagnosis not present

## 2012-02-28 DIAGNOSIS — R197 Diarrhea, unspecified: Secondary | ICD-10-CM | POA: Diagnosis not present

## 2012-02-28 DIAGNOSIS — M6281 Muscle weakness (generalized): Secondary | ICD-10-CM | POA: Diagnosis not present

## 2012-02-28 DIAGNOSIS — M25569 Pain in unspecified knee: Secondary | ICD-10-CM | POA: Diagnosis not present

## 2012-02-28 DIAGNOSIS — I69998 Other sequelae following unspecified cerebrovascular disease: Secondary | ICD-10-CM | POA: Diagnosis not present

## 2012-03-01 DIAGNOSIS — I69998 Other sequelae following unspecified cerebrovascular disease: Secondary | ICD-10-CM | POA: Diagnosis not present

## 2012-03-01 DIAGNOSIS — J449 Chronic obstructive pulmonary disease, unspecified: Secondary | ICD-10-CM | POA: Diagnosis not present

## 2012-03-01 DIAGNOSIS — R197 Diarrhea, unspecified: Secondary | ICD-10-CM | POA: Diagnosis not present

## 2012-03-01 DIAGNOSIS — I10 Essential (primary) hypertension: Secondary | ICD-10-CM | POA: Diagnosis not present

## 2012-03-01 DIAGNOSIS — M25569 Pain in unspecified knee: Secondary | ICD-10-CM | POA: Diagnosis not present

## 2012-03-01 DIAGNOSIS — M6281 Muscle weakness (generalized): Secondary | ICD-10-CM | POA: Diagnosis not present

## 2012-03-02 DIAGNOSIS — J449 Chronic obstructive pulmonary disease, unspecified: Secondary | ICD-10-CM | POA: Diagnosis not present

## 2012-03-02 DIAGNOSIS — M6281 Muscle weakness (generalized): Secondary | ICD-10-CM | POA: Diagnosis not present

## 2012-03-02 DIAGNOSIS — R197 Diarrhea, unspecified: Secondary | ICD-10-CM | POA: Diagnosis not present

## 2012-03-02 DIAGNOSIS — M25569 Pain in unspecified knee: Secondary | ICD-10-CM | POA: Diagnosis not present

## 2012-03-02 DIAGNOSIS — I10 Essential (primary) hypertension: Secondary | ICD-10-CM | POA: Diagnosis not present

## 2012-03-02 DIAGNOSIS — I69998 Other sequelae following unspecified cerebrovascular disease: Secondary | ICD-10-CM | POA: Diagnosis not present

## 2012-03-03 DIAGNOSIS — J449 Chronic obstructive pulmonary disease, unspecified: Secondary | ICD-10-CM | POA: Diagnosis not present

## 2012-03-03 DIAGNOSIS — R197 Diarrhea, unspecified: Secondary | ICD-10-CM | POA: Diagnosis not present

## 2012-03-03 DIAGNOSIS — M25569 Pain in unspecified knee: Secondary | ICD-10-CM | POA: Diagnosis not present

## 2012-03-03 DIAGNOSIS — I10 Essential (primary) hypertension: Secondary | ICD-10-CM | POA: Diagnosis not present

## 2012-03-03 DIAGNOSIS — I69998 Other sequelae following unspecified cerebrovascular disease: Secondary | ICD-10-CM | POA: Diagnosis not present

## 2012-03-03 DIAGNOSIS — M6281 Muscle weakness (generalized): Secondary | ICD-10-CM | POA: Diagnosis not present

## 2012-03-06 DIAGNOSIS — M25569 Pain in unspecified knee: Secondary | ICD-10-CM | POA: Diagnosis not present

## 2012-03-06 DIAGNOSIS — R197 Diarrhea, unspecified: Secondary | ICD-10-CM | POA: Diagnosis not present

## 2012-03-06 DIAGNOSIS — J449 Chronic obstructive pulmonary disease, unspecified: Secondary | ICD-10-CM | POA: Diagnosis not present

## 2012-03-06 DIAGNOSIS — M6281 Muscle weakness (generalized): Secondary | ICD-10-CM | POA: Diagnosis not present

## 2012-03-06 DIAGNOSIS — I69998 Other sequelae following unspecified cerebrovascular disease: Secondary | ICD-10-CM | POA: Diagnosis not present

## 2012-03-06 DIAGNOSIS — I10 Essential (primary) hypertension: Secondary | ICD-10-CM | POA: Diagnosis not present

## 2012-03-07 DIAGNOSIS — I10 Essential (primary) hypertension: Secondary | ICD-10-CM | POA: Diagnosis not present

## 2012-03-07 DIAGNOSIS — M25569 Pain in unspecified knee: Secondary | ICD-10-CM | POA: Diagnosis not present

## 2012-03-07 DIAGNOSIS — J449 Chronic obstructive pulmonary disease, unspecified: Secondary | ICD-10-CM | POA: Diagnosis not present

## 2012-03-07 DIAGNOSIS — R197 Diarrhea, unspecified: Secondary | ICD-10-CM | POA: Diagnosis not present

## 2012-03-07 DIAGNOSIS — M6281 Muscle weakness (generalized): Secondary | ICD-10-CM | POA: Diagnosis not present

## 2012-03-07 DIAGNOSIS — I69998 Other sequelae following unspecified cerebrovascular disease: Secondary | ICD-10-CM | POA: Diagnosis not present

## 2012-03-09 DIAGNOSIS — M25569 Pain in unspecified knee: Secondary | ICD-10-CM | POA: Diagnosis not present

## 2012-03-09 DIAGNOSIS — J449 Chronic obstructive pulmonary disease, unspecified: Secondary | ICD-10-CM | POA: Diagnosis not present

## 2012-03-09 DIAGNOSIS — M6281 Muscle weakness (generalized): Secondary | ICD-10-CM | POA: Diagnosis not present

## 2012-03-09 DIAGNOSIS — I10 Essential (primary) hypertension: Secondary | ICD-10-CM | POA: Diagnosis not present

## 2012-03-09 DIAGNOSIS — R197 Diarrhea, unspecified: Secondary | ICD-10-CM | POA: Diagnosis not present

## 2012-03-09 DIAGNOSIS — I69998 Other sequelae following unspecified cerebrovascular disease: Secondary | ICD-10-CM | POA: Diagnosis not present

## 2012-03-10 DIAGNOSIS — R197 Diarrhea, unspecified: Secondary | ICD-10-CM | POA: Diagnosis not present

## 2012-03-10 DIAGNOSIS — J449 Chronic obstructive pulmonary disease, unspecified: Secondary | ICD-10-CM | POA: Diagnosis not present

## 2012-03-10 DIAGNOSIS — I69998 Other sequelae following unspecified cerebrovascular disease: Secondary | ICD-10-CM | POA: Diagnosis not present

## 2012-03-10 DIAGNOSIS — I10 Essential (primary) hypertension: Secondary | ICD-10-CM | POA: Diagnosis not present

## 2012-03-10 DIAGNOSIS — M6281 Muscle weakness (generalized): Secondary | ICD-10-CM | POA: Diagnosis not present

## 2012-03-10 DIAGNOSIS — M25569 Pain in unspecified knee: Secondary | ICD-10-CM | POA: Diagnosis not present

## 2012-03-13 DIAGNOSIS — R197 Diarrhea, unspecified: Secondary | ICD-10-CM | POA: Diagnosis not present

## 2012-03-13 DIAGNOSIS — J449 Chronic obstructive pulmonary disease, unspecified: Secondary | ICD-10-CM | POA: Diagnosis not present

## 2012-03-13 DIAGNOSIS — M6281 Muscle weakness (generalized): Secondary | ICD-10-CM | POA: Diagnosis not present

## 2012-03-13 DIAGNOSIS — M25569 Pain in unspecified knee: Secondary | ICD-10-CM | POA: Diagnosis not present

## 2012-03-13 DIAGNOSIS — I10 Essential (primary) hypertension: Secondary | ICD-10-CM | POA: Diagnosis not present

## 2012-03-13 DIAGNOSIS — I69998 Other sequelae following unspecified cerebrovascular disease: Secondary | ICD-10-CM | POA: Diagnosis not present

## 2012-03-20 DIAGNOSIS — H6120 Impacted cerumen, unspecified ear: Secondary | ICD-10-CM | POA: Diagnosis not present

## 2012-03-20 DIAGNOSIS — I10 Essential (primary) hypertension: Secondary | ICD-10-CM | POA: Diagnosis not present

## 2012-03-20 DIAGNOSIS — I6789 Other cerebrovascular disease: Secondary | ICD-10-CM | POA: Diagnosis not present

## 2012-03-20 DIAGNOSIS — H6690 Otitis media, unspecified, unspecified ear: Secondary | ICD-10-CM | POA: Diagnosis not present

## 2012-03-24 DIAGNOSIS — H6120 Impacted cerumen, unspecified ear: Secondary | ICD-10-CM | POA: Diagnosis not present

## 2012-03-24 DIAGNOSIS — H6690 Otitis media, unspecified, unspecified ear: Secondary | ICD-10-CM | POA: Diagnosis not present

## 2012-06-22 DIAGNOSIS — I6789 Other cerebrovascular disease: Secondary | ICD-10-CM | POA: Diagnosis not present

## 2012-06-22 DIAGNOSIS — I259 Chronic ischemic heart disease, unspecified: Secondary | ICD-10-CM | POA: Diagnosis not present

## 2012-06-22 DIAGNOSIS — E785 Hyperlipidemia, unspecified: Secondary | ICD-10-CM | POA: Diagnosis not present

## 2012-07-05 DIAGNOSIS — Z961 Presence of intraocular lens: Secondary | ICD-10-CM | POA: Diagnosis not present

## 2012-07-05 DIAGNOSIS — H4010X0 Unspecified open-angle glaucoma, stage unspecified: Secondary | ICD-10-CM | POA: Diagnosis not present

## 2012-10-09 DIAGNOSIS — E785 Hyperlipidemia, unspecified: Secondary | ICD-10-CM | POA: Diagnosis not present

## 2012-10-09 DIAGNOSIS — I10 Essential (primary) hypertension: Secondary | ICD-10-CM | POA: Diagnosis not present

## 2012-10-09 DIAGNOSIS — I259 Chronic ischemic heart disease, unspecified: Secondary | ICD-10-CM | POA: Diagnosis not present

## 2012-10-09 DIAGNOSIS — I6789 Other cerebrovascular disease: Secondary | ICD-10-CM | POA: Diagnosis not present

## 2012-10-11 DIAGNOSIS — I259 Chronic ischemic heart disease, unspecified: Secondary | ICD-10-CM | POA: Diagnosis not present

## 2012-10-11 DIAGNOSIS — I6789 Other cerebrovascular disease: Secondary | ICD-10-CM | POA: Diagnosis not present

## 2012-10-11 DIAGNOSIS — E785 Hyperlipidemia, unspecified: Secondary | ICD-10-CM | POA: Diagnosis not present

## 2012-10-11 DIAGNOSIS — I10 Essential (primary) hypertension: Secondary | ICD-10-CM | POA: Diagnosis not present

## 2012-10-31 ENCOUNTER — Encounter (HOSPITAL_COMMUNITY): Payer: Medicare Other | Attending: Internal Medicine

## 2012-10-31 DIAGNOSIS — Z09 Encounter for follow-up examination after completed treatment for conditions other than malignant neoplasm: Secondary | ICD-10-CM | POA: Diagnosis not present

## 2012-10-31 DIAGNOSIS — D0590 Unspecified type of carcinoma in situ of unspecified breast: Secondary | ICD-10-CM

## 2012-10-31 DIAGNOSIS — Z853 Personal history of malignant neoplasm of breast: Secondary | ICD-10-CM | POA: Diagnosis not present

## 2012-10-31 LAB — CBC WITH DIFFERENTIAL/PLATELET
Basophils Absolute: 0 10*3/uL (ref 0.0–0.1)
Eosinophils Absolute: 0.1 10*3/uL (ref 0.0–0.7)
Eosinophils Relative: 1 % (ref 0–5)
Lymphocytes Relative: 35 % (ref 12–46)
MCV: 83.5 fL (ref 78.0–100.0)
Platelets: 282 10*3/uL (ref 150–400)
RDW: 14.1 % (ref 11.5–15.5)
WBC: 8 10*3/uL (ref 4.0–10.5)

## 2012-10-31 LAB — COMPREHENSIVE METABOLIC PANEL
ALT: 15 U/L (ref 0–35)
AST: 17 U/L (ref 0–37)
Albumin: 3.9 g/dL (ref 3.5–5.2)
CO2: 28 mEq/L (ref 19–32)
Calcium: 9.7 mg/dL (ref 8.4–10.5)
Sodium: 140 mEq/L (ref 135–145)
Total Protein: 6.8 g/dL (ref 6.0–8.3)

## 2012-10-31 NOTE — Progress Notes (Signed)
Labs drawn today for cbc/diff,cmp 

## 2012-11-01 ENCOUNTER — Encounter (HOSPITAL_BASED_OUTPATIENT_CLINIC_OR_DEPARTMENT_OTHER): Payer: Medicare Other

## 2012-11-01 ENCOUNTER — Other Ambulatory Visit (HOSPITAL_COMMUNITY): Payer: Self-pay | Admitting: Oncology

## 2012-11-01 ENCOUNTER — Encounter (HOSPITAL_COMMUNITY): Payer: Self-pay

## 2012-11-01 VITALS — BP 157/68 | HR 51 | Temp 97.7°F | Resp 16 | Wt 122.0 lb

## 2012-11-01 DIAGNOSIS — Z853 Personal history of malignant neoplasm of breast: Secondary | ICD-10-CM

## 2012-11-01 DIAGNOSIS — Z139 Encounter for screening, unspecified: Secondary | ICD-10-CM

## 2012-11-01 NOTE — Patient Instructions (Addendum)
[facility] Discharge Instructions  RECOMMENDATIONS MADE BY THE CONSULTANT AND ANY TEST RESULTS WILL BE SENT TO YOUR REFERRING PHYSICIAN.  EXAM FINDINGS BY THE PHYSICIAN TODAY AND SIGNS OR SYMPTOMS TO REPORT TO CLINIC OR PRIMARY PHYSICIAN: Exam and discussion by Dr. Neale Burly.  MEDICATIONS PRESCRIBED:  none  INSTRUCTIONS GIVEN AND DISCUSSED: Report any new lumps, bone pain, shortness of breath or other symptoms.  SPECIAL INSTRUCTIONS/FOLLOW-UP: Follow-up in 1 year.  Thank you for choosing [facility] to provide your oncology and hematology care.  To afford each patient quality time with our providers, please arrive at least 15 minutes before your scheduled appointment time.  With your help, our goal is to use those 15 minutes to complete the necessary work-up to ensure our physicians have the information they need to help with your evaluation and healthcare recommendations.    Effective January 1st, 2014, we ask that you re-schedule your appointment with our physicians should you arrive 10 or more minutes late for your appointment.  We strive to give you quality time with our providers, and arriving late affects you and other patients whose appointments are after yours.    Again, thank you for choosing [facility].  Our hope is that these requests will decrease the amount of time that you wait before being seen by our physicians.       _____________________________________________________________  Should you have questions after your visit to [facility], please contact our office at [phone] between the hours of 8:30 a.m. and 5:00 p.m.  Voicemails left after 4:30 p.m. will not be returned until the following business day.  For prescription refill requests, have your pharmacy contact our office with your prescription refill request.

## 2012-11-01 NOTE — Progress Notes (Signed)
[facility] OFFICE PROGRESS NOTE  PCP: Fredirick Maudlin, MD 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 female returns for followup with the diagnosis as above. She is up-to-date with primary care. No acute interval complaints. She has no particular health worries. She has no shortness of breath no chest or abdominal pain no new bone pain no unexplained weight loss. She does do breast self-exam the opposite breast and has found no abnormalities    MEDICAL HISTORY: Past Medical History  Diagnosis Date  . Herniated disc   . Cyst of breast     Right - at 22 yrs of age.  . Shingles   . Hardening of the arteries of the brain   . Stroke 2004    cerebral hemmorhage  . Diverticulitis   . Arthritis   . 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 Hx Breast cancer, DCIS, Right 08/19/2010  . Migraine headache   . SBO (small bowel obstruction) 09/02/11    SURGICAL HISTORY:  Past Surgical History  Procedure Laterality Date  . Brain surgery      hemorrhage  . Cystectomy    . Spine surgery      repair herniated disc  . Fibroid tumors    . Breast surgery  09/25/2010    mastectomy      PROBLEM LIST : has Herniated disc; Shingles; Hardening of the arteries of the brain; Stroke; Cyst of breast, right, solitary; Fatigue/loss of sleep; High blood pressure; Bronchitis; Asthma; Skin moles (abnormal); Gastroesophageal reflux disease with hiatal hernia; Hearing loss; Wears glasses and/or contacts; Glaucoma; Menopause; Hx Breast cancer, DCIS, Right; and SBO (small bowel obstruction) on her problem list.    ALLERGIES:  is allergic to tape; codeine; and morphine and related.  MEDICATIONS: Current outpatient prescriptions:amLODipine (NORVASC) 10 MG tablet,  Take 10 mg by mouth every morning. , Disp: , Rfl: ;  atorvastatin (LIPITOR) 20 MG tablet, Take 20 mg by mouth every morning. , Disp: , Rfl: ;  calcium carbonate (TUMS EX) 750 MG chewable tablet, Chew 1 tablet by mouth 2 (two) times daily. , Disp: , Rfl: ;  clopidogrel (PLAVIX) 75 MG tablet, Take 75 mg by mouth every morning. , Disp: , Rfl:  losartan-hydrochlorothiazide (HYZAAR) 100-12.5 MG per tablet, Take 1 tablet by mouth every morning. , Disp: , Rfl: ;  Methylcellulose, Laxative, (CITRUCEL) 500 MG TABS, Take 1 tablet by mouth daily. , Disp: , Rfl: ;  metoprolol (LOPRESSOR) 50 MG tablet, Take 25 mg by mouth 2 (two) times daily. , Disp: , Rfl:  Multiple Vitamin (MULTIVITAMIN WITH MINERALS) TABS, Take 1 tablet by mouth every morning. *Contains NO Iron (Ferrous Sulfate and/or Iodine)*, Disp: , Rfl: ;  omeprazole (PRILOSEC) 20 MG capsule, Take 20 mg by mouth every morning. , Disp: , Rfl: ;  vitamin C (ASCORBIC ACID) 500 MG tablet, Take 500 mg by mouth every morning. , Disp: , Rfl:  diphenoxylate-atropine (LOMOTIL) 2.5-0.025 MG per tablet, Take 1 tablet by mouth 4 (four) times daily as needed for diarrhea or loose stools., Disp: 30 tablet, Rfl: 0    REVIEW OF SYSTEMS:  No headache no dizziness no chills no sweats no chest pain no abdominal pain no edema no bone pain  PHYSICAL EXAMINATION: ECOG PERFORMANCE  STATUS: 0 - Asymptomatic  Filed Vitals:   11/01/12 1034  BP: 157/68  Pulse: 51  Temp: 97.7 F (36.5 C)  Resp: 16    GENERAL: No distress, well nourished.  SKIN:  No rashes or bruising  HEAD: Normocephalic, No trauma EYES: Sclera and conjuntiva clear  ENT: No thrush LYMPH: No palpable lymphadenopathy neck, supraclavicular submandibular axilla BREAST:Normal without mass, skin or nipple changes o, no tenderness, and the opposite site mastectomy site is unremarkable no evidence of chest wall or skin recurrence  LUNGS: Clear to auscultation, no crackles or wheezes or rhonchi HEART: Regular rate  & rhythm,   ABDOMEN: Abdomen soft, non-tender, , no masses or organomegaly   MSK: No deformity, no tenderness over spine, no acutely hot or inflammed joints EXTREMITIES: No lower ext edema NEURO: Alert & oriented, no gross focal weakness. PSYCH  Cooperative, mood/affect normal   LABORATORY DATA: Results for orders placed in visit on 10/31/12 (from the past 48 hour(s))  CBC WITH DIFFERENTIAL     Status: None   Collection Time    10/31/12 10:30 AM      Result Value Range   WBC 8.0  4.0 - 10.5 K/uL   RBC 4.86  3.87 - 5.11 MIL/uL   Hemoglobin 13.5  12.0 - 15.0 g/dL   HCT 82.9  56.2 - 13.0 %   MCV 83.5  78.0 - 100.0 fL   MCH 27.8  26.0 - 34.0 pg   MCHC 33.3  30.0 - 36.0 g/dL   RDW 86.5  78.4 - 69.6 %   Platelets 282  150 - 400 K/uL   Neutrophils Relative % 54  43 - 77 %   Neutro Abs 4.4  1.7 - 7.7 K/uL   Lymphocytes Relative 35  12 - 46 %   Lymphs Abs 2.8  0.7 - 4.0 K/uL   Monocytes Relative 10  3 - 12 %   Monocytes Absolute 0.8  0.1 - 1.0 K/uL   Eosinophils Relative 1  0 - 5 %   Eosinophils Absolute 0.1  0.0 - 0.7 K/uL   Basophils Relative 0  0 - 1 %   Basophils Absolute 0.0  0.0 - 0.1 K/uL  COMPREHENSIVE METABOLIC PANEL     Status: Abnormal   Collection Time    10/31/12 10:30 AM      Result Value Range   Sodium 140  135 - 145 mEq/L   Potassium 3.6  3.5 - 5.1 mEq/L   Chloride 102  96 - 112 mEq/L   CO2 28  19 - 32 mEq/L   Glucose, Bld 99  70 - 99 mg/dL   BUN 20  6 - 23 mg/dL   Creatinine, Ser 2.95  0.50 - 1.10 mg/dL   Calcium 9.7  8.4 - 28.4 mg/dL   Total Protein 6.8  6.0 - 8.3 g/dL   Albumin 3.9  3.5 - 5.2 g/dL   AST 17  0 - 37 U/L   ALT 15  0 - 35 U/L   Alkaline Phosphatase 91  39 - 117 U/L   Total Bilirubin 0.6  0.3 - 1.2 mg/dL   GFR calc non Af Amer 74 (*) >90 mL/min   GFR calc Af Amer 85 (*) >90 mL/min   Comment:            The eGFR has been calculated     using the CKD EPI equation.     This calculation has not been     validated in  all clinical      situations.     eGFR's persistently     <90 mL/min signify     possible Chronic Kidney Disease.      RADIOGRAPHIC STUDIES: No results found.   ASSESSMENT: Ductal carcinoma in situ back in 2012, status post mastectomy 09/25/10. Disease was hormone receptor negative so no tamoxifen. Patient is up-to-date with primary care. Exam is benign.   PLAN: Followup in one year. I can do for her routine labs are primary care. Yearly mammogram is coming up in one month.   All questions were answered. The patient knows to call the clinic with any problems, questions or concerns. We can certainly see the patient much sooner if necessary.     Marin Roberts, MD 11/01/2012 12:38 PM

## 2012-11-28 DIAGNOSIS — C50919 Malignant neoplasm of unspecified site of unspecified female breast: Secondary | ICD-10-CM | POA: Diagnosis not present

## 2012-11-28 DIAGNOSIS — J209 Acute bronchitis, unspecified: Secondary | ICD-10-CM | POA: Diagnosis not present

## 2012-11-28 DIAGNOSIS — I10 Essential (primary) hypertension: Secondary | ICD-10-CM | POA: Diagnosis not present

## 2012-12-05 ENCOUNTER — Ambulatory Visit (HOSPITAL_COMMUNITY)
Admission: RE | Admit: 2012-12-05 | Discharge: 2012-12-05 | Disposition: A | Discharge status: Home / Self Care | Payer: Medicare Other | Source: Ambulatory Visit | Attending: Oncology | Admitting: Oncology

## 2012-12-05 DIAGNOSIS — Z1231 Encounter for screening mammogram for malignant neoplasm of breast: Secondary | ICD-10-CM | POA: Diagnosis not present

## 2012-12-05 DIAGNOSIS — Z139 Encounter for screening, unspecified: Secondary | ICD-10-CM

## 2012-12-28 DIAGNOSIS — Z23 Encounter for immunization: Secondary | ICD-10-CM | POA: Diagnosis not present

## 2013-01-10 DIAGNOSIS — I119 Hypertensive heart disease without heart failure: Secondary | ICD-10-CM | POA: Diagnosis not present

## 2013-01-10 DIAGNOSIS — I259 Chronic ischemic heart disease, unspecified: Secondary | ICD-10-CM | POA: Diagnosis not present

## 2013-01-10 DIAGNOSIS — E785 Hyperlipidemia, unspecified: Secondary | ICD-10-CM | POA: Diagnosis not present

## 2013-01-10 DIAGNOSIS — C50919 Malignant neoplasm of unspecified site of unspecified female breast: Secondary | ICD-10-CM | POA: Diagnosis not present

## 2013-03-15 ENCOUNTER — Emergency Department (HOSPITAL_COMMUNITY)
Admission: EM | Admit: 2013-03-15 | Discharge: 2013-03-15 | Disposition: A | Discharge status: Home / Self Care | Payer: Medicare Other | Attending: Emergency Medicine | Admitting: Emergency Medicine

## 2013-03-15 ENCOUNTER — Emergency Department (HOSPITAL_COMMUNITY): Payer: Medicare Other

## 2013-03-15 ENCOUNTER — Encounter (HOSPITAL_COMMUNITY): Payer: Self-pay | Admitting: Emergency Medicine

## 2013-03-15 DIAGNOSIS — Z853 Personal history of malignant neoplasm of breast: Secondary | ICD-10-CM | POA: Diagnosis not present

## 2013-03-15 DIAGNOSIS — R1033 Periumbilical pain: Secondary | ICD-10-CM | POA: Diagnosis not present

## 2013-03-15 DIAGNOSIS — Z8739 Personal history of other diseases of the musculoskeletal system and connective tissue: Secondary | ICD-10-CM | POA: Insufficient documentation

## 2013-03-15 DIAGNOSIS — Z7902 Long term (current) use of antithrombotics/antiplatelets: Secondary | ICD-10-CM | POA: Insufficient documentation

## 2013-03-15 DIAGNOSIS — Z8619 Personal history of other infectious and parasitic diseases: Secondary | ICD-10-CM | POA: Insufficient documentation

## 2013-03-15 DIAGNOSIS — Z8679 Personal history of other diseases of the circulatory system: Secondary | ICD-10-CM | POA: Diagnosis not present

## 2013-03-15 DIAGNOSIS — K5730 Diverticulosis of large intestine without perforation or abscess without bleeding: Secondary | ICD-10-CM | POA: Diagnosis not present

## 2013-03-15 DIAGNOSIS — Z8742 Personal history of other diseases of the female genital tract: Secondary | ICD-10-CM | POA: Insufficient documentation

## 2013-03-15 DIAGNOSIS — K529 Noninfective gastroenteritis and colitis, unspecified: Secondary | ICD-10-CM

## 2013-03-15 DIAGNOSIS — Z79899 Other long term (current) drug therapy: Secondary | ICD-10-CM | POA: Diagnosis not present

## 2013-03-15 DIAGNOSIS — Z8673 Personal history of transient ischemic attack (TIA), and cerebral infarction without residual deficits: Secondary | ICD-10-CM | POA: Insufficient documentation

## 2013-03-15 DIAGNOSIS — K5289 Other specified noninfective gastroenteritis and colitis: Secondary | ICD-10-CM | POA: Diagnosis not present

## 2013-03-15 DIAGNOSIS — R1084 Generalized abdominal pain: Secondary | ICD-10-CM | POA: Diagnosis not present

## 2013-03-15 DIAGNOSIS — R109 Unspecified abdominal pain: Secondary | ICD-10-CM

## 2013-03-15 LAB — URINE MICROSCOPIC-ADD ON

## 2013-03-15 LAB — BASIC METABOLIC PANEL
BUN: 19 mg/dL (ref 6–23)
Chloride: 100 mEq/L (ref 96–112)
Creatinine, Ser: 0.72 mg/dL (ref 0.50–1.10)
GFR calc Af Amer: 88 mL/min — ABNORMAL LOW (ref >90)
GFR calc non Af Amer: 76 mL/min — ABNORMAL LOW (ref >90)
Glucose, Bld: 141 mg/dL — ABNORMAL HIGH (ref 70–99)
Potassium: 3.5 mEq/L (ref 3.5–5.1)

## 2013-03-15 LAB — CBC WITH DIFFERENTIAL/PLATELET
Basophils Absolute: 0 10*3/uL (ref 0.0–0.1)
Basophils Relative: 0 % (ref 0–1)
Eosinophils Absolute: 0 10*3/uL (ref 0.0–0.7)
Eosinophils Relative: 0 % (ref 0–5)
Hemoglobin: 14.2 g/dL (ref 12.0–15.0)
Lymphocytes Relative: 11 % — ABNORMAL LOW (ref 12–46)
MCH: 27.5 pg (ref 26.0–34.0)
MCHC: 33.6 g/dL (ref 30.0–36.0)
MCV: 82 fL (ref 78.0–100.0)
Monocytes Absolute: 0.6 10*3/uL (ref 0.1–1.0)
Monocytes Relative: 5 % (ref 3–12)
Neutro Abs: 11.5 10*3/uL — ABNORMAL HIGH (ref 1.7–7.7)
Neutrophils Relative %: 85 % — ABNORMAL HIGH (ref 43–77)
RBC: 5.16 MIL/uL — ABNORMAL HIGH (ref 3.87–5.11)
RDW: 14 % (ref 11.5–15.5)

## 2013-03-15 LAB — URINALYSIS, ROUTINE W REFLEX MICROSCOPIC
Nitrite: NEGATIVE
Protein, ur: NEGATIVE mg/dL
Specific Gravity, Urine: <1.005 — ABNORMAL LOW (ref 1.005–1.030)
Urobilinogen, UA: 0.2 mg/dL (ref 0.0–1.0)
pH: 6 (ref 5.0–8.0)

## 2013-03-15 LAB — HEPATIC FUNCTION PANEL
ALT: 28 U/L (ref 0–35)
Bilirubin, Direct: <0.1 mg/dL (ref 0.0–0.3)
Total Protein: 7.3 g/dL (ref 6.0–8.3)

## 2013-03-15 MED ORDER — ONDANSETRON HCL 4 MG/2ML IJ SOLN
4.0000 mg | Freq: Once | INTRAMUSCULAR | Status: AC
  Administered 2013-03-15: 4 mg via INTRAVENOUS
  Filled 2013-03-15: qty 2

## 2013-03-15 MED ORDER — ONDANSETRON HCL 4 MG PO TABS: 4.0000 mg | ORAL_TABLET | Freq: Four times a day (QID) | ORAL | Status: DC

## 2013-03-15 MED ORDER — SODIUM CHLORIDE 0.9 % IV SOLN
1000.0000 mL | INTRAVENOUS | Status: DC
  Administered 2013-03-15: 1000 mL via INTRAVENOUS

## 2013-03-15 MED ORDER — IOHEXOL 300 MG/ML  SOLN
100.0000 mL | Freq: Once | INTRAMUSCULAR | Status: AC | PRN
  Administered 2013-03-15: 100 mL via INTRAVENOUS

## 2013-03-15 MED ORDER — HYDROCODONE-ACETAMINOPHEN 5-325 MG PO TABS: 1.0000 | ORAL_TABLET | Freq: Four times a day (QID) | ORAL | Status: DC | PRN

## 2013-03-15 MED ORDER — IOHEXOL 300 MG/ML  SOLN
50.0000 mL | Freq: Once | INTRAMUSCULAR | Status: AC | PRN
  Administered 2013-03-15: 50 mL via ORAL

## 2013-03-15 MED ORDER — CIPROFLOXACIN HCL 250 MG PO TABS
500.0000 mg | ORAL_TABLET | Freq: Once | ORAL | Status: AC
  Administered 2013-03-15: 500 mg via ORAL
  Filled 2013-03-15: qty 2

## 2013-03-15 MED ORDER — CIPROFLOXACIN HCL 500 MG PO TABS: 500.0000 mg | ORAL_TABLET | Freq: Two times a day (BID) | ORAL | Status: DC

## 2013-03-15 NOTE — ED Notes (Signed)
Patient with c/o mid abdominal pain that started yesterday. Reports h/o obstructions and diverticulitis in past. Alert/oriented x 4.

## 2013-03-15 NOTE — ED Provider Notes (Signed)
CSN: [id number]     Arrival date & time 03/15/13  0757 History   First MD Initiated Contact with Patient 03/15/13 [phone]     Chief Complaint  Patient presents with  . Abdominal Pain   (Consider location/radiation/quality/duration/timing/severity/associated sxs/prior Treatment) HPI Comments: Patient is an 79 year old female who presents to the emergency department with abdominal pain. The patient states that earlier during the week she had barbecue for dinner, shortly after this she had some abdominal pain and some nausea with one or 2 episodes of vomiting. The patient states this got better. This was on December 22. On December 24 the pain got worse with more vomiting and more pain. And today she continued to have some pain and came into the emergency department as she has had a history of bowel obstructions and diverticulitis in the past. The patient denies passing any blood in the vomitus. She denies seeing any blood in her stool. She denies any known temperature elevations. His been no injury or trauma reported. It is of note that the patient is on Plavix. As well as the fact that the patient has had breast cancer in the past.  Patient is a 79 y.o. female presenting with abdominal pain. The history is provided by the patient.  Abdominal Pain Associated symptoms: nausea and vomiting   Associated symptoms: no chest pain, no cough, no diarrhea, no dysuria, no hematuria and no shortness of breath     Past Medical History  Diagnosis Date  . Herniated disc   . Cyst of breast     Right - at 34 yrs of age.  . Shingles   . Hardening of the arteries of the brain   . Stroke 2004    cerebral hemmorhage  . Diverticulitis   . Arthritis   . 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 Hx Breast cancer, DCIS, Right 08/19/2010  . Migraine headache   . SBO (small bowel obstruction) 09/02/11   Past Surgical History  Procedure Laterality Date  . Brain surgery     hemorrhage  . Cystectomy    . Spine surgery      repair herniated disc  . Fibroid tumors    . Breast surgery  09/25/2010    mastectomy   Family History  Problem Relation Age of Onset  . Cancer Mother   . Heart disease Father   . Spinal muscular atrophy Sister     spinal meningitis   History  Substance Use Topics  . Smoking status: Never Smoker   . Smokeless tobacco: Never Used  . Alcohol Use: No   OB History   Grav Para Term Preterm Abortions TAB SAB Ect Mult Living                 Review of Systems  Constitutional: Negative for activity change.       All ROS Neg except as noted in HPI  HENT: Negative for nosebleeds.   Eyes: Negative for photophobia and discharge.  Respiratory: Negative for cough, shortness of breath and wheezing.   Cardiovascular: Negative for chest pain and palpitations.  Gastrointestinal: Positive for nausea, vomiting and abdominal pain. Negative for diarrhea and blood in stool.  Genitourinary: Negative for dysuria, frequency and hematuria.  Musculoskeletal: Negative for arthralgias, back pain and neck pain.  Skin: Negative.   Neurological: Negative for dizziness, seizures and speech difficulty.  Psychiatric/Behavioral: Negative for hallucinations and confusion.    Allergies  Tape;  Sulfa antibiotics; Codeine; and Morphine and related  Home Medications   Current Outpatient Rx  Name  Route  Sig  Dispense  Refill  . amLODipine (NORVASC) 10 MG tablet   Oral   Take 10 mg by mouth every morning.          Marland Kitchen atorvastatin (LIPITOR) 20 MG tablet   Oral   Take 20 mg by mouth every morning.          . calcium carbonate (TUMS EX) 750 MG chewable tablet   Oral   Chew 1 tablet by mouth 2 (two) times daily.          . clopidogrel (PLAVIX) 75 MG tablet   Oral   Take 75 mg by mouth every morning.          . diphenoxylate-atropine (LOMOTIL) 2.5-0.025 MG per tablet   Oral   Take 1 tablet by mouth 4 (four) times daily as needed for diarrhea or  loose stools.   30 tablet   0   . losartan-hydrochlorothiazide (HYZAAR) 100-12.5 MG per tablet   Oral   Take 1 tablet by mouth every morning.          . Methylcellulose, Laxative, (CITRUCEL) 500 MG TABS   Oral   Take 1 tablet by mouth daily.          . metoprolol (LOPRESSOR) 50 MG tablet   Oral   Take 25 mg by mouth 2 (two) times daily.          . Multiple Vitamin (MULTIVITAMIN WITH MINERALS) TABS   Oral   Take 1 tablet by mouth every morning. *Contains NO Iron (Ferrous Sulfate and/or Iodine)*         . omeprazole (PRILOSEC) 20 MG capsule   Oral   Take 20 mg by mouth every morning.          . vitamin C (ASCORBIC ACID) 500 MG tablet   Oral   Take 500 mg by mouth every morning.           BP 158/58  Pulse 73  Temp(Src) 98.3 F (36.8 C) (Oral)  Resp 18  Ht 5\' 3"  (1.6 m)  Wt 129 lb (58.514 kg)  BMI 22.86 kg/m2  SpO2 99% Physical Exam  Nursing note and vitals reviewed. Constitutional: She is oriented to person, place, and time. She appears well-developed and well-nourished.  Non-toxic appearance.  HENT:  Head: Normocephalic.  Right Ear: Tympanic membrane and external ear normal.  Left Ear: Tympanic membrane and external ear normal.  Eyes: EOM and lids are normal. Pupils are equal, round, and reactive to light.  Neck: Normal range of motion. Neck supple. Carotid bruit is not present.  Cardiovascular: Normal rate, regular rhythm, normal heart sounds, intact distal pulses and normal pulses.   Pulmonary/Chest: Breath sounds normal. No respiratory distress.  Right mastectomy  Abdominal: Soft. Bowel sounds are normal. She exhibits no distension and no mass. There is no tenderness. There is no rebound and no guarding.  Pain to palpation of the periumbilical area. Mild tenderness of the left lower abd.  Genitourinary:  Chaperone present. No rectal mass appreciated. Stool neg for occult blood.  Musculoskeletal: Normal range of motion.  Lymphadenopathy:       Head  (right side): No submandibular adenopathy present.       Head (left side): No submandibular adenopathy present.    She has no cervical adenopathy.  Neurological: She is alert and oriented to person, place, and time. She has  normal strength. No cranial nerve deficit or sensory deficit.  Skin: Skin is warm and dry.  Psychiatric: She has a normal mood and affect. Her speech is normal.    ED Course  Procedures (including critical care time) Labs Review Labs Reviewed  CBC WITH DIFFERENTIAL  BASIC METABOLIC PANEL  HEPATIC FUNCTION PANEL  LIPASE, BLOOD  URINALYSIS, ROUTINE W REFLEX MICROSCOPIC  OCCULT BLOOD, POC DEVICE   Imaging Review No results found.  EKG Interpretation    Date/Time:  Thursday March 15 2013 08:11:42 EST Ventricular Rate:  66 PR Interval:  156 QRS Duration: 86 QT Interval:  412 QTC Calculation: 431 R Axis:   52 Text Interpretation:  Normal sinus rhythm Nonspecific ST abnormality Abnormal QRS-T angle, consider primary T wave abnormality Abnormal ECG No significant change since last tracing Confirmed by GOLDSTON  MD, SCOTT ([id number]) on 03/15/2013 8:26:18 AM            MDM  No diagnosis found. *I have reviewed nursing notes, vital signs, and all appropriate lab and imaging results for this patient.**  Electrocardiogram shows normal sinus rhythm with nonspecific ST abnormality. There is also some abnormal QRS T. angle changes present. There is no acute finding. Complete blood count shows an elevation of the white blood cells at 13,600. The remainder of the complete blood count is within normal limits. The basic metabolic panel shows a glucose to be slightly elevated at 141, otherwise within normal limits. The hepatic function test shows the alkaline phosphatase to be elevated at 134, otherwise normal. Lipase is normal at 26. Stool for occult blood was negative.  CT Abd/Pelvis - There is thickening of a long segment of the wall of the distal ileum. This is  suggestive of enteritis or inflammatory bowel disease. Sigmoid diverticulosis but no diverticulitis. Pt seen with me by Dr Criss Alvine. Plan = Rx for cipro, norco, and zofran. Pt to return immediately if not improving.  Kathie Dike, PA-C 03/16/13 2229

## 2013-03-15 NOTE — ED Notes (Signed)
Patient with no complaints at this time. Respirations even and unlabored. Skin warm/dry. Discharge instructions reviewed with patient at this time. Patient given opportunity to voice concerns/ask questions. IV removed per policy and band-aid applied to site. Patient discharged at this time and left Emergency Department via wheelchair. Patient in NAD 

## 2013-03-17 NOTE — ED Provider Notes (Signed)
Medical screening examination/treatment/procedure(s) were conducted as a shared visit with non-physician practitioner(s) and myself.  I personally evaluated the patient during the encounter.  EKG Interpretation    Date/Time:  Thursday March 15 2013 08:11:42 EST Ventricular Rate:  66 PR Interval:  156 QRS Duration: 86 QT Interval:  412 QTC Calculation: 431 R Axis:   52 Text Interpretation:  Normal sinus rhythm Nonspecific ST abnormality Abnormal QRS-T angle, consider primary T wave abnormality Abnormal ECG No significant change since last tracing Confirmed by Sanika Brosious  MD, Alanson Hausmann ([id number]) on 03/15/2013 8:26:18 AM            Patient's abd exam is soft, mild tenderness. Non-surgical. CT c/w enteritis, which fits with her history. Afebrile, labs show mild leukocytosis. No vomiting here, feel she can be managed as an outpatient. Due to amount of inflamed bowel, will cover with abx.   Audree Camel, MD 03/17/13 [phone]

## 2013-04-10 DIAGNOSIS — I119 Hypertensive heart disease without heart failure: Secondary | ICD-10-CM | POA: Diagnosis not present

## 2013-04-10 DIAGNOSIS — K2100 Gastro-esophageal reflux disease with esophagitis, without bleeding: Secondary | ICD-10-CM | POA: Diagnosis not present

## 2013-04-10 DIAGNOSIS — I259 Chronic ischemic heart disease, unspecified: Secondary | ICD-10-CM | POA: Diagnosis not present

## 2013-04-10 DIAGNOSIS — R1084 Generalized abdominal pain: Secondary | ICD-10-CM | POA: Diagnosis not present

## 2013-05-21 DIAGNOSIS — I10 Essential (primary) hypertension: Secondary | ICD-10-CM | POA: Diagnosis not present

## 2013-05-21 DIAGNOSIS — IMO0001 Reserved for inherently not codable concepts without codable children: Secondary | ICD-10-CM | POA: Diagnosis not present

## 2013-05-21 DIAGNOSIS — K2100 Gastro-esophageal reflux disease with esophagitis, without bleeding: Secondary | ICD-10-CM | POA: Diagnosis not present

## 2013-05-21 DIAGNOSIS — E785 Hyperlipidemia, unspecified: Secondary | ICD-10-CM | POA: Diagnosis not present

## 2013-05-21 DIAGNOSIS — N390 Urinary tract infection, site not specified: Secondary | ICD-10-CM | POA: Diagnosis not present

## 2013-05-21 DIAGNOSIS — I6789 Other cerebrovascular disease: Secondary | ICD-10-CM | POA: Diagnosis not present

## 2013-07-02 DIAGNOSIS — G4700 Insomnia, unspecified: Secondary | ICD-10-CM | POA: Diagnosis not present

## 2013-07-02 DIAGNOSIS — K2100 Gastro-esophageal reflux disease with esophagitis, without bleeding: Secondary | ICD-10-CM | POA: Diagnosis not present

## 2013-07-02 DIAGNOSIS — I10 Essential (primary) hypertension: Secondary | ICD-10-CM | POA: Diagnosis not present

## 2013-07-02 DIAGNOSIS — R3589 Other polyuria: Secondary | ICD-10-CM | POA: Diagnosis not present

## 2013-07-02 DIAGNOSIS — R358 Other polyuria: Secondary | ICD-10-CM | POA: Diagnosis not present

## 2013-07-02 DIAGNOSIS — N318 Other neuromuscular dysfunction of bladder: Secondary | ICD-10-CM | POA: Diagnosis not present

## 2013-07-10 DIAGNOSIS — R413 Other amnesia: Secondary | ICD-10-CM | POA: Diagnosis not present

## 2013-07-10 DIAGNOSIS — N9489 Other specified conditions associated with female genital organs and menstrual cycle: Secondary | ICD-10-CM | POA: Diagnosis not present

## 2013-07-10 DIAGNOSIS — K2100 Gastro-esophageal reflux disease with esophagitis, without bleeding: Secondary | ICD-10-CM | POA: Diagnosis not present

## 2013-07-16 ENCOUNTER — Encounter: Payer: Self-pay | Admitting: *Deleted

## 2013-07-17 ENCOUNTER — Ambulatory Visit (INDEPENDENT_AMBULATORY_CARE_PROVIDER_SITE_OTHER): Payer: Medicare Other | Admitting: Obstetrics and Gynecology

## 2013-07-17 ENCOUNTER — Encounter: Payer: Self-pay | Admitting: Obstetrics and Gynecology

## 2013-07-17 VITALS — BP 144/70 | Ht 63.0 in | Wt 127.8 lb

## 2013-07-17 DIAGNOSIS — N9089 Other specified noninflammatory disorders of vulva and perineum: Secondary | ICD-10-CM | POA: Insufficient documentation

## 2013-07-17 MED ORDER — NYSTATIN-TRIAMCINOLONE 100000-0.1 UNIT/GM-% EX OINT: 1.0000 "application " | TOPICAL_OINTMENT | Freq: Two times a day (BID) | CUTANEOUS | Status: DC

## 2013-07-17 NOTE — Progress Notes (Signed)
Patient ID: Caroline Aguirre, female   DOB: 18-Aug-1924, 80 y.o.   MRN: [id number]   [place] [facility] Visit  Patient name: Caroline Aguirre MRN [id number]  Date of birth: September 27, 1924  CC & HPI:  Caroline Aguirre is a 80 y.o. female presenting today for vulvar discomfort x months.  ROS:  Sees hawkins .  Pertinent History Reviewed:  Medical & Surgical Hx:  Reviewed: Significant for 89 yrs Medications: Reviewed & Updated - see associated section Social History: Reviewed -  reports that she has never smoked. She has never used smokeless tobacco.  Objective Findings:  Vitals: BP 144/70  Ht 5\' 3"  (1.6 m)  Wt 127 lb 12.8 oz (57.97 kg)  BMI 22.64 kg/m2  Physical Examination: General appearance - alert, well appearing, and in no distress Mental status - alert, oriented to person, place, and time Vulva: symmetric diffuse erythema with scaling at edges A few ? satellite lesions?  Assessment & Plan:   Vulvar erythema. Plan: Mycolog.

## 2013-07-25 DIAGNOSIS — B029 Zoster without complications: Secondary | ICD-10-CM | POA: Diagnosis not present

## 2013-07-26 DIAGNOSIS — E785 Hyperlipidemia, unspecified: Secondary | ICD-10-CM | POA: Diagnosis not present

## 2013-07-26 DIAGNOSIS — K2100 Gastro-esophageal reflux disease with esophagitis, without bleeding: Secondary | ICD-10-CM | POA: Diagnosis not present

## 2013-07-26 DIAGNOSIS — R634 Abnormal weight loss: Secondary | ICD-10-CM | POA: Diagnosis not present

## 2013-08-02 ENCOUNTER — Other Ambulatory Visit (HOSPITAL_COMMUNITY): Payer: Self-pay | Admitting: Pulmonary Disease

## 2013-08-02 DIAGNOSIS — E0520 Thyrotoxicosis with toxic multinodular goiter without thyrotoxic crisis or storm: Secondary | ICD-10-CM | POA: Diagnosis not present

## 2013-08-02 DIAGNOSIS — E0590 Thyrotoxicosis, unspecified without thyrotoxic crisis or storm: Secondary | ICD-10-CM

## 2013-08-06 ENCOUNTER — Ambulatory Visit (HOSPITAL_COMMUNITY)
Admission: RE | Admit: 2013-08-06 | Discharge: 2013-08-06 | Disposition: A | Discharge status: Home / Self Care | Payer: Medicare Other | Source: Ambulatory Visit | Attending: Pulmonary Disease | Admitting: Pulmonary Disease

## 2013-08-06 DIAGNOSIS — E042 Nontoxic multinodular goiter: Secondary | ICD-10-CM | POA: Insufficient documentation

## 2013-08-06 DIAGNOSIS — E0590 Thyrotoxicosis, unspecified without thyrotoxic crisis or storm: Secondary | ICD-10-CM

## 2013-08-09 ENCOUNTER — Other Ambulatory Visit (HOSPITAL_COMMUNITY): Payer: Self-pay | Admitting: Pulmonary Disease

## 2013-08-09 DIAGNOSIS — E049 Nontoxic goiter, unspecified: Secondary | ICD-10-CM

## 2013-08-16 ENCOUNTER — Ambulatory Visit (HOSPITAL_COMMUNITY)
Admission: RE | Admit: 2013-08-16 | Discharge: 2013-08-16 | Disposition: A | Discharge status: Home / Self Care | Payer: Medicare Other | Source: Ambulatory Visit | Attending: Pulmonary Disease | Admitting: Pulmonary Disease

## 2013-08-16 ENCOUNTER — Encounter (HOSPITAL_COMMUNITY): Payer: Self-pay

## 2013-08-16 ENCOUNTER — Other Ambulatory Visit (HOSPITAL_COMMUNITY): Payer: Self-pay | Admitting: Pulmonary Disease

## 2013-08-16 DIAGNOSIS — E049 Nontoxic goiter, unspecified: Secondary | ICD-10-CM

## 2013-08-16 DIAGNOSIS — E041 Nontoxic single thyroid nodule: Secondary | ICD-10-CM | POA: Diagnosis not present

## 2013-08-16 MED ORDER — LIDOCAINE HCL (PF) 2 % IJ SOLN
INTRAMUSCULAR | Status: AC
  Administered 2013-08-16: 12:00:00 10 mL
  Filled 2013-08-16: qty 20

## 2013-08-16 MED ORDER — ALBUMIN HUMAN 25 % IV SOLN
INTRAVENOUS | Status: AC
  Filled 2013-08-16: qty 50

## 2013-08-16 MED ORDER — LIDOCAINE HCL (PF) 2 % IJ SOLN
10.0000 mL | Freq: Once | INTRAMUSCULAR | Status: AC
  Administered 2013-08-16: 10 mL

## 2013-08-16 MED ORDER — LIDOCAINE HCL (PF) 2 % IJ SOLN
INTRAMUSCULAR | Status: AC
  Filled 2013-08-16: qty 10

## 2013-09-07 DIAGNOSIS — R21 Rash and other nonspecific skin eruption: Secondary | ICD-10-CM | POA: Diagnosis not present

## 2013-09-25 DIAGNOSIS — H4010X0 Unspecified open-angle glaucoma, stage unspecified: Secondary | ICD-10-CM | POA: Diagnosis not present

## 2013-10-08 DIAGNOSIS — H4010X0 Unspecified open-angle glaucoma, stage unspecified: Secondary | ICD-10-CM | POA: Diagnosis not present

## 2013-10-15 DIAGNOSIS — H4010X0 Unspecified open-angle glaucoma, stage unspecified: Secondary | ICD-10-CM | POA: Diagnosis not present

## 2013-10-17 DIAGNOSIS — I10 Essential (primary) hypertension: Secondary | ICD-10-CM | POA: Diagnosis not present

## 2013-10-17 DIAGNOSIS — K2100 Gastro-esophageal reflux disease with esophagitis, without bleeding: Secondary | ICD-10-CM | POA: Diagnosis not present

## 2013-10-17 DIAGNOSIS — I6789 Other cerebrovascular disease: Secondary | ICD-10-CM | POA: Diagnosis not present

## 2013-10-17 DIAGNOSIS — Z23 Encounter for immunization: Secondary | ICD-10-CM | POA: Diagnosis not present

## 2013-10-17 DIAGNOSIS — I259 Chronic ischemic heart disease, unspecified: Secondary | ICD-10-CM | POA: Diagnosis not present

## 2013-11-01 ENCOUNTER — Encounter (HOSPITAL_COMMUNITY): Payer: Medicare Other | Attending: Hematology and Oncology

## 2013-11-01 ENCOUNTER — Ambulatory Visit (HOSPITAL_COMMUNITY): Payer: Medicare Other | Admitting: Oncology

## 2013-11-01 ENCOUNTER — Encounter (HOSPITAL_COMMUNITY): Payer: Self-pay

## 2013-11-01 ENCOUNTER — Encounter (HOSPITAL_COMMUNITY): Payer: Medicare Other

## 2013-11-01 VITALS — BP 143/46 | HR 54 | Temp 97.8°F | Resp 18 | Wt 126.0 lb

## 2013-11-01 DIAGNOSIS — J45909 Unspecified asthma, uncomplicated: Secondary | ICD-10-CM | POA: Diagnosis not present

## 2013-11-01 DIAGNOSIS — IMO0002 Reserved for concepts with insufficient information to code with codable children: Secondary | ICD-10-CM | POA: Diagnosis not present

## 2013-11-01 DIAGNOSIS — Z882 Allergy status to sulfonamides status: Secondary | ICD-10-CM | POA: Diagnosis not present

## 2013-11-01 DIAGNOSIS — G43909 Migraine, unspecified, not intractable, without status migrainosus: Secondary | ICD-10-CM | POA: Diagnosis not present

## 2013-11-01 DIAGNOSIS — Z9109 Other allergy status, other than to drugs and biological substances: Secondary | ICD-10-CM | POA: Insufficient documentation

## 2013-11-01 DIAGNOSIS — Z87898 Personal history of other specified conditions: Secondary | ICD-10-CM | POA: Diagnosis not present

## 2013-11-01 DIAGNOSIS — Z8673 Personal history of transient ischemic attack (TIA), and cerebral infarction without residual deficits: Secondary | ICD-10-CM | POA: Diagnosis not present

## 2013-11-01 DIAGNOSIS — R5381 Other malaise: Secondary | ICD-10-CM | POA: Insufficient documentation

## 2013-11-01 DIAGNOSIS — I672 Cerebral atherosclerosis: Secondary | ICD-10-CM | POA: Diagnosis not present

## 2013-11-01 DIAGNOSIS — B029 Zoster without complications: Secondary | ICD-10-CM | POA: Insufficient documentation

## 2013-11-01 DIAGNOSIS — K449 Diaphragmatic hernia without obstruction or gangrene: Secondary | ICD-10-CM | POA: Insufficient documentation

## 2013-11-01 DIAGNOSIS — Z881 Allergy status to other antibiotic agents status: Secondary | ICD-10-CM | POA: Insufficient documentation

## 2013-11-01 DIAGNOSIS — Z885 Allergy status to narcotic agent status: Secondary | ICD-10-CM | POA: Diagnosis not present

## 2013-11-01 DIAGNOSIS — K219 Gastro-esophageal reflux disease without esophagitis: Secondary | ICD-10-CM | POA: Diagnosis not present

## 2013-11-01 DIAGNOSIS — Z853 Personal history of malignant neoplasm of breast: Secondary | ICD-10-CM

## 2013-11-01 DIAGNOSIS — H409 Unspecified glaucoma: Secondary | ICD-10-CM | POA: Insufficient documentation

## 2013-11-01 DIAGNOSIS — I2510 Atherosclerotic heart disease of native coronary artery without angina pectoris: Secondary | ICD-10-CM | POA: Insufficient documentation

## 2013-11-01 DIAGNOSIS — Z9010 Acquired absence of unspecified breast and nipple: Secondary | ICD-10-CM | POA: Diagnosis not present

## 2013-11-01 DIAGNOSIS — I10 Essential (primary) hypertension: Secondary | ICD-10-CM | POA: Diagnosis not present

## 2013-11-01 DIAGNOSIS — M159 Polyosteoarthritis, unspecified: Secondary | ICD-10-CM | POA: Diagnosis not present

## 2013-11-01 DIAGNOSIS — K56609 Unspecified intestinal obstruction, unspecified as to partial versus complete obstruction: Secondary | ICD-10-CM | POA: Diagnosis not present

## 2013-11-01 DIAGNOSIS — H9190 Unspecified hearing loss, unspecified ear: Secondary | ICD-10-CM | POA: Diagnosis not present

## 2013-11-01 DIAGNOSIS — E049 Nontoxic goiter, unspecified: Secondary | ICD-10-CM

## 2013-11-01 DIAGNOSIS — D239 Other benign neoplasm of skin, unspecified: Secondary | ICD-10-CM | POA: Insufficient documentation

## 2013-11-01 DIAGNOSIS — K5732 Diverticulitis of large intestine without perforation or abscess without bleeding: Secondary | ICD-10-CM | POA: Diagnosis not present

## 2013-11-01 DIAGNOSIS — R5383 Other fatigue: Secondary | ICD-10-CM

## 2013-11-01 DIAGNOSIS — E040 Nontoxic diffuse goiter: Secondary | ICD-10-CM

## 2013-11-01 DIAGNOSIS — Z1231 Encounter for screening mammogram for malignant neoplasm of breast: Secondary | ICD-10-CM

## 2013-11-01 LAB — COMPREHENSIVE METABOLIC PANEL
ALBUMIN: 4.1 g/dL (ref 3.5–5.2)
ALK PHOS: 113 U/L (ref 39–117)
ALT: 18 U/L (ref 0–35)
AST: 21 U/L (ref 0–37)
Anion gap: 11 (ref 5–15)
BILIRUBIN TOTAL: 0.6 mg/dL (ref 0.3–1.2)
BUN: 21 mg/dL (ref 6–23)
CHLORIDE: 101 meq/L (ref 96–112)
CO2: 30 mEq/L (ref 19–32)
Calcium: 9.8 mg/dL (ref 8.4–10.5)
Creatinine, Ser: 0.95 mg/dL (ref 0.50–1.10)
GFR calc Af Amer: 61 mL/min — ABNORMAL LOW (ref >90)
GFR calc non Af Amer: 52 mL/min — ABNORMAL LOW (ref >90)
Glucose, Bld: 108 mg/dL — ABNORMAL HIGH (ref 70–99)
POTASSIUM: 4.3 meq/L (ref 3.7–5.3)
Sodium: 142 mEq/L (ref 137–147)
Total Protein: 7.1 g/dL (ref 6.0–8.3)

## 2013-11-01 LAB — CBC WITH DIFFERENTIAL/PLATELET
BASOS ABS: 0 10*3/uL (ref 0.0–0.1)
BASOS PCT: 0 % (ref 0–1)
Eosinophils Absolute: 0.1 10*3/uL (ref 0.0–0.7)
Eosinophils Relative: 2 % (ref 0–5)
HCT: 39.1 % (ref 36.0–46.0)
HEMOGLOBIN: 12.9 g/dL (ref 12.0–15.0)
Lymphocytes Relative: 31 % (ref 12–46)
Lymphs Abs: 2.3 10*3/uL (ref 0.7–4.0)
MCH: 27.4 pg (ref 26.0–34.0)
MCHC: 33 g/dL (ref 30.0–36.0)
MCV: 83.2 fL (ref 78.0–100.0)
Monocytes Absolute: 0.7 10*3/uL (ref 0.1–1.0)
Monocytes Relative: 10 % (ref 3–12)
NEUTROS ABS: 4.3 10*3/uL (ref 1.7–7.7)
NEUTROS PCT: 57 % (ref 43–77)
Platelets: 297 10*3/uL (ref 150–400)
RBC: 4.7 MIL/uL (ref 3.87–5.11)
RDW: 14.1 % (ref 11.5–15.5)
WBC: 7.5 10*3/uL (ref 4.0–10.5)

## 2013-11-01 NOTE — Progress Notes (Signed)
[facility]  OFFICE PROGRESS NOTE  Alonza Bogus, MD [place] [address]  DIAGNOSIS: Hx Breast cancer, DCIS, Right - Plan: CBC with Differential, Comprehensive metabolic panel  Chief Complaint  Patient presents with  . ER negative DCIS    CURRENT THERAPY: Watchful expectation for noninvasive ductal neoplasia of the breast, ER/PR negative, status post mastectomy on 09/25/2010.  INTERVAL HISTORY: Caroline Aguirre 80 y.o. female returns for followup of noninvasive ductal neoplasia of the breast status post right mastectomy mastectomy on 09/25/2010. She underwent fine needle aspiration of the thyroid gland on 08/08/2013 findings consistent with a benign goiter. She lives with her son who is bipolar. He is currently suffering with cholecystitis and may need an operation. Appetite is good with no nausea, vomiting, or lower extremity swelling or lymphedema. She denies any diarrhea, constipation, but does have occasional urinary incontinence. She does wear pads. She denies any cough, shortness of breath, PND, orthopnea, palpitations, skin rash, but does have chronic bilateral hip pain and right knee pain. She does use a cane for stability.   MEDICAL HISTORY: Past Medical History  Diagnosis Date  . Herniated disc   . Cyst of breast     Right - at 24 yrs of age.  . Shingles   . Hardening of the arteries of the brain   . Stroke 2004    cerebral hemmorhage  . Diverticulitis   . Arthritis   . 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 Hx Breast cancer, DCIS, Right 08/19/2010  . Migraine headache   . SBO (small bowel obstruction) 09/02/11  . Elevated cholesterol   . Heart disease   . GERD (gastroesophageal reflux disease)   . Coronary artery disease   . Hypertension     INTERIM HISTORY: has Herniated disc; Shingles; Hardening of the arteries of the brain; Stroke; Cyst of  breast, right, solitary; Fatigue/loss of sleep; High blood pressure; Bronchitis; Asthma; Skin moles (abnormal); Gastroesophageal reflux disease with hiatal hernia; Hearing loss; Wears glasses and/or contacts; Glaucoma; Menopause; Hx Breast cancer, DCIS, Right; SBO (small bowel obstruction); and Vulvar irritation on her problem list.    ALLERGIES:  is allergic to tape; ciprofloxacin; sulfa antibiotics; codeine; and morphine and related.  MEDICATIONS: has a current medication list which includes the following prescription(s): amlodipine, atorvastatin, calcium carbonate, clopidogrel, losartan-hydrochlorothiazide, methylcellulose (laxative), metoprolol, multivitamin with minerals, nystatin-triamcinolone ointment, omeprazole, and vitamin c.  SURGICAL HISTORY:  Past Surgical History  Procedure Laterality Date  . Brain surgery      hemorrhage  . Cystectomy    . Spine surgery      repair herniated disc  . Fibroid tumors    . Breast surgery  09/25/2010    mastectomy  . Knee arthroscopy Right     right knee x 3  . Laparoscopic bilateral salpingo oopherectomy      FAMILY HISTORY: family history includes Cancer in her mother; Heart disease in her father; Spinal muscular atrophy in her sister.  SOCIAL HISTORY:  reports that she has never smoked. She has never used smokeless tobacco. She reports that she does not drink alcohol or use illicit drugs.  REVIEW OF SYSTEMS:  Other than that discussed above is noncontributory.  PHYSICAL EXAMINATION: ECOG PERFORMANCE STATUS: 1 - Symptomatic but completely ambulatory  There were no vitals taken for this visit.  GENERAL:alert, no distress and comfortable  SKIN: skin color, texture, turgor are normal, no rashes or significant lesions EYES: PERLA; Conjunctiva are pink and non-injected, sclera clear SINUSES: No redness or tenderness over maxillary or ethmoid sinuses OROPHARYNX:no exudate, no erythema on lips, buccal mucosa, or tongue. NECK: supple, thyroid  diffusely enlarged, non-tender, without nodularity. No masses CHEST: Status post right mastectomy with no subcutaneous nodules. Left breast without mass. LYMPH:  no palpable lymphadenopathy in the cervical, axillary or inguinal LUNGS: clear to auscultation and percussion with normal breathing effort HEART: regular rate & rhythm and no murmurs. ABDOMEN:abdomen soft, non-tender and normal bowel sounds MUSCULOSKELETAL:no cyanosis of digits and no clubbing. Decreased range of motion of both hips. Decreased range of motion of the right knee. NEURO: alert & oriented x 3 with fluent speech, no focal motor/sensory deficits.  Hearing deficit.   LABORATORY DATA: No visits with results within 30 Day(s) from this visit. Latest known visit with results is:  Admission on 03/15/2013, Discharged on 03/15/2013  Component Date Value Ref Range Status  . Fecal Occult Bld 03/15/2013 NEGATIVE  NEGATIVE Final  . WBC 03/15/2013 13.6* 4.0 - 10.5 K/uL Final  . RBC 03/15/2013 5.16* 3.87 - 5.11 MIL/uL Final  . Hemoglobin 03/15/2013 14.2  12.0 - 15.0 g/dL Final  . HCT 03/15/2013 42.3  36.0 - 46.0 % Final  . MCV 03/15/2013 82.0  78.0 - 100.0 fL Final  . MCH 03/15/2013 27.5  26.0 - 34.0 pg Final  . MCHC 03/15/2013 33.6  30.0 - 36.0 g/dL Final  . RDW 03/15/2013 14.0  11.5 - 15.5 % Final  . Platelets 03/15/2013 318  150 - 400 K/uL Final  . Neutrophils Relative % 03/15/2013 85* 43 - 77 % Final  . Neutro Abs 03/15/2013 11.5* 1.7 - 7.7 K/uL Final  . Lymphocytes Relative 03/15/2013 11* 12 - 46 % Final  . Lymphs Abs 03/15/2013 1.5  0.7 - 4.0 K/uL Final  . Monocytes Relative 03/15/2013 5  3 - 12 % Final  . Monocytes Absolute 03/15/2013 0.6  0.1 - 1.0 K/uL Final  . Eosinophils Relative 03/15/2013 0  0 - 5 % Final  . Eosinophils Absolute 03/15/2013 0.0  0.0 - 0.7 K/uL Final  . Basophils Relative 03/15/2013 0  0 - 1 % Final  . Basophils Absolute 03/15/2013 0.0  0.0 - 0.1 K/uL Final  . Sodium 03/15/2013 141  135 - 145  mEq/L Final  . Potassium 03/15/2013 3.5  3.5 - 5.1 mEq/L Final  . Chloride 03/15/2013 100  96 - 112 mEq/L Final  . CO2 03/15/2013 27  19 - 32 mEq/L Final  . Glucose, Bld 03/15/2013 141* 70 - 99 mg/dL Final  . BUN 03/15/2013 19  6 - 23 mg/dL Final  . Creatinine, Ser 03/15/2013 0.72  0.50 - 1.10 mg/dL Final  . Calcium 03/15/2013 10.0  8.4 - 10.5 mg/dL Final  . GFR calc non Af Amer 03/15/2013 76* >90 mL/min Final  . GFR calc Af Amer 03/15/2013 88* >90 mL/min Final   Comment: (NOTE)                          The eGFR has been calculated using the CKD EPI equation.                          This calculation has not been validated in all clinical situations.  eGFR's persistently <90 mL/min signify possible Chronic Kidney                          Disease.  . Total Protein 03/15/2013 7.3  6.0 - 8.3 g/dL Final  . Albumin 03/15/2013 4.0  3.5 - 5.2 g/dL Final  . AST 03/15/2013 27  0 - 37 U/L Final  . ALT 03/15/2013 28  0 - 35 U/L Final  . Alkaline Phosphatase 03/15/2013 134* 39 - 117 U/L Final  . Total Bilirubin 03/15/2013 0.6  0.3 - 1.2 mg/dL Final  . Bilirubin, Direct 03/15/2013 <0.1  0.0 - 0.3 mg/dL Final  . Indirect Bilirubin 03/15/2013 NOT CALCULATED  0.3 - 0.9 mg/dL Final  . Lipase 03/15/2013 26  11 - 59 U/L Final  . Color, Urine 03/15/2013 YELLOW  YELLOW Final  . APPearance 03/15/2013 CLEAR  CLEAR Final  . Specific Gravity, Urine 03/15/2013 <1.005* 1.005 - 1.030 Final  . pH 03/15/2013 6.0  5.0 - 8.0 Final  . Glucose, UA 03/15/2013 NEGATIVE  NEGATIVE mg/dL Final  . Hgb urine dipstick 03/15/2013 TRACE* NEGATIVE Final  . Bilirubin Urine 03/15/2013 NEGATIVE  NEGATIVE Final  . Ketones, ur 03/15/2013 NEGATIVE  NEGATIVE mg/dL Final  . Protein, ur 03/15/2013 NEGATIVE  NEGATIVE mg/dL Final  . Urobilinogen, UA 03/15/2013 0.2  0.0 - 1.0 mg/dL Final  . Nitrite 03/15/2013 NEGATIVE  NEGATIVE Final  . Leukocytes, UA 03/15/2013 TRACE* NEGATIVE Final  . WBC, UA 03/15/2013 0-2  <3  WBC/hpf Final  . RBC / HPF 03/15/2013 0-2  <3 RBC/hpf Final    PATHOLOGY:  for CHRISANNE, LOOSE ([id number]) Patient: CAMBRE, MATSON Collected: 08/16/2013 Client: [facility] Accession: [id number] Received: 08/16/2013 Lorriane Shire DOB: 1924/11/09 Age: 47 Gender: F Reported: 08/17/2013 [address] Patient Ph: [phone] MRN#: [id number] Linna [place] [id number] Client Acc#: Chart: Phone: [phone] Fax: LMP: Visit#: [id number] CC: Sinda Du CYTOPATHOLOGY REPORT Adequacy Reason Satisfactory For Evaluation. Diagnosis THYROID, FINE NEEDLE ASPIRATION RIGHT MID NODULE (SPECIMEN 1 OF 3 COLLECTED ON 08/16/13) BENIGN. FINDINGS CONSISTENT WITH NON-NEOPLASTIC GOITER. Vicente Males MD Pathologist, Electronic Signature (Case signed 08/17/2013) Specimen Clinical Information nodule Source Thyroid, Fine Needle Aspiration, right mid nodule (specimen 1 of 3 collected 08/16/13) Gross Specimen: Received is/are 3 slides in 95% Ethyl alcohol, 3 air dried for Diff stain. 30cc's of peach Cytolyt solution. (BS:bs) Prepared: # Smears: 6 # Concentration Technique Slides (i.e. ThinPrep): 1 # Cell Block: Cell block attempted, not obtained. Additional Studies: n/a Report signed out from the following location(s) Associate Professor and Interpretation performed at [facility], [place], [place] [address]. CLIA #: [id number],    Urinalysis    Component Value Date/Time   COLORURINE YELLOW 03/15/2013 1042   APPEARANCEUR CLEAR 03/15/2013 1042   LABSPEC <1.005* 03/15/2013 1042   PHURINE 6.0 03/15/2013 1042   GLUCOSEU NEGATIVE 03/15/2013 1042   HGBUR TRACE* 03/15/2013 1042   BILIRUBINUR NEGATIVE 03/15/2013 1042   KETONESUR NEGATIVE 03/15/2013 1042   PROTEINUR NEGATIVE 03/15/2013 1042   UROBILINOGEN 0.2 03/15/2013 1042   NITRITE NEGATIVE 03/15/2013 1042   LEUKOCYTESUR TRACE* 03/15/2013 1042    RADIOGRAPHIC STUDIES: MM Digital Screening Unilat L  Status: Final result            Study Result    *RADIOLOGY REPORT*  Clinical Data: Screening.  DIGITAL SCREENING UNILATERAL LEFT MAMMOGRAM WITH CAD  Comparison: Previous exam(s).  FINDINGS:  ACR Breast Density Category b: There are scattered areas of  fibroglandular density.  There are no findings suspicious for malignancy.  Images were processed with CAD.  IMPRESSION:  No mammographic evidence of malignancy.  A result letter of this screening mammogram will be mailed directly  to the patient.  RECOMMENDATION:  Screening mammogram in one year. (Code:SM-B-01Y)  BI-RADS CATEGORY 1: Negative.  Original Report Authenticated By:     ASSESSMENT:  #1. Noninvasive ductal neoplasia of the right breast, status post right simple mastectomy, ER/PR negative, no evidence of disease due for mammogram in September 2015. #2. Degenerative joint disease involving both hips and right knee particularly. #3. Gastroesophageal reflux disease, on treatment. #4. Hypertension, controlled. #5. Hyperlipidemia, on treatment.   PLAN:  #1. Continue monthly self breast examination. #2. Mammogram in September 2015. #3. Followup in one year with CBC and chem profile along with vitamin D level.   All questions were answered. The patient knows to call the clinic with any problems, questions or concerns. We can certainly see the patient much sooner if necessary.   I spent 25 minutes counseling the patient face to face. The total time spent in the appointment was 30 minutes.    Doroteo Bradford, MD 11/01/2013 9:17 AM  DISCLAIMER:  This note was dictated with voice recognition software.  Similar sounding words can inadvertently be transcribed inaccurately and may not be corrected upon review.

## 2013-11-01 NOTE — Patient Instructions (Signed)
[facility] Discharge Instructions  RECOMMENDATIONS MADE BY THE CONSULTANT AND ANY TEST RESULTS WILL BE SENT TO YOUR REFERRING PHYSICIAN.  EXAM FINDINGS BY THE PHYSICIAN TODAY AND SIGNS OR SYMPTOMS TO REPORT TO CLINIC OR PRIMARY PHYSICIAN: Exam and findings as discussed by Dr. Barnet Glasgow.  Mammogram next month. Return in one year for office visit.  Thank you for choosing [facility] to provide your oncology and hematology care.  To afford each patient quality time with our providers, please arrive at least 15 minutes before your scheduled appointment time.  With your help, our goal is to use those 15 minutes to complete the necessary work-up to ensure our physicians have the information they need to help with your evaluation and healthcare recommendations.    Effective January 1st, 2014, we ask that you re-schedule your appointment with our physicians should you arrive 10 or more minutes late for your appointment.  We strive to give you quality time with our providers, and arriving late affects you and other patients whose appointments are after yours.    Again, thank you for choosing [facility].  Our hope is that these requests will decrease the amount of time that you wait before being seen by our physicians.       _____________________________________________________________  Should you have questions after your visit to [facility], please contact our office at [phone] between the hours of 8:30 a.m. and 4:30 p.m.  Voicemails left after 4:30 p.m. will not be returned until the following business day.  For prescription refill requests, have your pharmacy contact our office with your prescription refill request.    _______________________________________________________________  We hope that we have given you very good care.  You may receive a patient satisfaction survey in the mail, please complete it and return it as soon as  possible.  We value your feedback!  _______________________________________________________________  Have you asked about our STAR program?  STAR stands for Survivorship Training and Rehabilitation, and this is a nationally recognized cancer care program that focuses on survivorship and rehabilitation.  Cancer and cancer treatments may cause problems, such as, pain, making you feel tired and keeping you from doing the things that you need or want to do. Cancer rehabilitation can help. Our goal is to reduce these troubling effects and help you have the best quality of life possible.  You may receive a survey from a nurse that asks questions about your current state of health.  Based on the survey results, all eligible patients will be referred to the [facility] program for an evaluation so we can better serve you!  A frequently asked questions sheet is available upon request.

## 2013-12-06 ENCOUNTER — Ambulatory Visit (HOSPITAL_COMMUNITY)
Admission: RE | Admit: 2013-12-06 | Discharge: 2013-12-06 | Disposition: A | Discharge status: Home / Self Care | Payer: Medicare Other | Source: Ambulatory Visit | Attending: Hematology and Oncology | Admitting: Hematology and Oncology

## 2013-12-06 DIAGNOSIS — Z853 Personal history of malignant neoplasm of breast: Secondary | ICD-10-CM

## 2013-12-06 DIAGNOSIS — Z1231 Encounter for screening mammogram for malignant neoplasm of breast: Secondary | ICD-10-CM | POA: Diagnosis not present

## 2013-12-07 ENCOUNTER — Ambulatory Visit (HOSPITAL_COMMUNITY): Payer: Medicare Other

## 2013-12-12 DIAGNOSIS — H6120 Impacted cerumen, unspecified ear: Secondary | ICD-10-CM | POA: Diagnosis not present

## 2013-12-12 DIAGNOSIS — J069 Acute upper respiratory infection, unspecified: Secondary | ICD-10-CM | POA: Diagnosis not present

## 2013-12-25 DIAGNOSIS — Z23 Encounter for immunization: Secondary | ICD-10-CM | POA: Diagnosis not present

## 2014-01-17 DIAGNOSIS — C50919 Malignant neoplasm of unspecified site of unspecified female breast: Secondary | ICD-10-CM | POA: Diagnosis not present

## 2014-01-17 DIAGNOSIS — I119 Hypertensive heart disease without heart failure: Secondary | ICD-10-CM | POA: Diagnosis not present

## 2014-01-17 DIAGNOSIS — I639 Cerebral infarction, unspecified: Secondary | ICD-10-CM | POA: Diagnosis not present

## 2014-01-17 DIAGNOSIS — N3281 Overactive bladder: Secondary | ICD-10-CM | POA: Diagnosis not present

## 2014-03-04 DIAGNOSIS — L569 Acute skin change due to ultraviolet radiation, unspecified: Secondary | ICD-10-CM | POA: Diagnosis not present

## 2014-03-04 DIAGNOSIS — I10 Essential (primary) hypertension: Secondary | ICD-10-CM | POA: Diagnosis not present

## 2014-04-05 ENCOUNTER — Ambulatory Visit (INDEPENDENT_AMBULATORY_CARE_PROVIDER_SITE_OTHER): Payer: Medicare Other | Admitting: Urology

## 2014-04-05 DIAGNOSIS — N3946 Mixed incontinence: Secondary | ICD-10-CM | POA: Diagnosis not present

## 2014-04-05 DIAGNOSIS — N390 Urinary tract infection, site not specified: Secondary | ICD-10-CM

## 2014-04-05 DIAGNOSIS — N952 Postmenopausal atrophic vaginitis: Secondary | ICD-10-CM | POA: Diagnosis not present

## 2014-04-17 DIAGNOSIS — H4011X2 Primary open-angle glaucoma, moderate stage: Secondary | ICD-10-CM | POA: Diagnosis not present

## 2014-04-17 DIAGNOSIS — Z961 Presence of intraocular lens: Secondary | ICD-10-CM | POA: Diagnosis not present

## 2014-04-18 DIAGNOSIS — N3281 Overactive bladder: Secondary | ICD-10-CM | POA: Diagnosis not present

## 2014-04-18 DIAGNOSIS — I639 Cerebral infarction, unspecified: Secondary | ICD-10-CM | POA: Diagnosis not present

## 2014-04-18 DIAGNOSIS — I119 Hypertensive heart disease without heart failure: Secondary | ICD-10-CM | POA: Diagnosis not present

## 2014-04-18 DIAGNOSIS — I2510 Atherosclerotic heart disease of native coronary artery without angina pectoris: Secondary | ICD-10-CM | POA: Diagnosis not present

## 2014-05-03 ENCOUNTER — Ambulatory Visit (INDEPENDENT_AMBULATORY_CARE_PROVIDER_SITE_OTHER): Payer: Medicare Other | Admitting: Urology

## 2014-05-03 DIAGNOSIS — N390 Urinary tract infection, site not specified: Secondary | ICD-10-CM | POA: Diagnosis not present

## 2014-05-03 DIAGNOSIS — N3946 Mixed incontinence: Secondary | ICD-10-CM

## 2014-05-03 DIAGNOSIS — R351 Nocturia: Secondary | ICD-10-CM | POA: Diagnosis not present

## 2014-07-24 DIAGNOSIS — C50919 Malignant neoplasm of unspecified site of unspecified female breast: Secondary | ICD-10-CM | POA: Diagnosis not present

## 2014-07-24 DIAGNOSIS — I119 Hypertensive heart disease without heart failure: Secondary | ICD-10-CM | POA: Diagnosis not present

## 2014-07-24 DIAGNOSIS — N3281 Overactive bladder: Secondary | ICD-10-CM | POA: Diagnosis not present

## 2014-07-24 DIAGNOSIS — I639 Cerebral infarction, unspecified: Secondary | ICD-10-CM | POA: Diagnosis not present

## 2014-07-26 ENCOUNTER — Ambulatory Visit (INDEPENDENT_AMBULATORY_CARE_PROVIDER_SITE_OTHER): Payer: Medicare Other | Admitting: Urology

## 2014-07-26 DIAGNOSIS — N390 Urinary tract infection, site not specified: Secondary | ICD-10-CM

## 2014-07-26 DIAGNOSIS — N3946 Mixed incontinence: Secondary | ICD-10-CM

## 2014-08-29 DIAGNOSIS — T50905S Adverse effect of unspecified drugs, medicaments and biological substances, sequela: Secondary | ICD-10-CM | POA: Diagnosis not present

## 2014-09-09 DIAGNOSIS — I2510 Atherosclerotic heart disease of native coronary artery without angina pectoris: Secondary | ICD-10-CM | POA: Diagnosis not present

## 2014-09-09 DIAGNOSIS — B029 Zoster without complications: Secondary | ICD-10-CM | POA: Diagnosis not present

## 2014-09-25 DIAGNOSIS — R252 Cramp and spasm: Secondary | ICD-10-CM | POA: Diagnosis not present

## 2014-09-25 DIAGNOSIS — N390 Urinary tract infection, site not specified: Secondary | ICD-10-CM | POA: Diagnosis not present

## 2014-09-25 DIAGNOSIS — N3281 Overactive bladder: Secondary | ICD-10-CM | POA: Diagnosis not present

## 2014-10-02 DIAGNOSIS — E785 Hyperlipidemia, unspecified: Secondary | ICD-10-CM | POA: Diagnosis not present

## 2014-10-02 DIAGNOSIS — I2510 Atherosclerotic heart disease of native coronary artery without angina pectoris: Secondary | ICD-10-CM | POA: Diagnosis not present

## 2014-10-02 DIAGNOSIS — N3281 Overactive bladder: Secondary | ICD-10-CM | POA: Diagnosis not present

## 2014-10-02 DIAGNOSIS — K219 Gastro-esophageal reflux disease without esophagitis: Secondary | ICD-10-CM | POA: Diagnosis not present

## 2014-10-08 DIAGNOSIS — N3281 Overactive bladder: Secondary | ICD-10-CM | POA: Diagnosis not present

## 2014-10-08 DIAGNOSIS — I2510 Atherosclerotic heart disease of native coronary artery without angina pectoris: Secondary | ICD-10-CM | POA: Diagnosis not present

## 2014-10-08 DIAGNOSIS — R634 Abnormal weight loss: Secondary | ICD-10-CM | POA: Diagnosis not present

## 2014-10-08 DIAGNOSIS — K219 Gastro-esophageal reflux disease without esophagitis: Secondary | ICD-10-CM | POA: Diagnosis not present

## 2014-10-08 DIAGNOSIS — E46 Unspecified protein-calorie malnutrition: Secondary | ICD-10-CM | POA: Diagnosis not present

## 2014-10-14 ENCOUNTER — Other Ambulatory Visit (HOSPITAL_COMMUNITY): Payer: Self-pay | Admitting: Pulmonary Disease

## 2014-10-14 DIAGNOSIS — E0590 Thyrotoxicosis, unspecified without thyrotoxic crisis or storm: Secondary | ICD-10-CM

## 2014-10-16 ENCOUNTER — Encounter (HOSPITAL_COMMUNITY): Payer: Self-pay

## 2014-10-16 ENCOUNTER — Encounter (HOSPITAL_COMMUNITY)
Admission: RE | Admit: 2014-10-16 | Discharge: 2014-10-16 | Disposition: A | Discharge status: Home / Self Care | Payer: Medicare Other | Source: Ambulatory Visit | Attending: Pulmonary Disease | Admitting: Pulmonary Disease

## 2014-10-16 DIAGNOSIS — E0590 Thyrotoxicosis, unspecified without thyrotoxic crisis or storm: Secondary | ICD-10-CM

## 2014-10-16 HISTORY — DX: Type 2 diabetes mellitus without complications: E11.9

## 2014-10-16 HISTORY — DX: Malignant (primary) neoplasm, unspecified: C80.1

## 2014-10-16 MED ORDER — SODIUM IODIDE I 131 CAPSULE
13.0000 | Freq: Once | INTRAVENOUS | Status: AC | PRN
  Administered 2014-10-16: 13 via ORAL

## 2014-10-17 ENCOUNTER — Encounter (HOSPITAL_COMMUNITY): Payer: Self-pay

## 2014-10-17 ENCOUNTER — Ambulatory Visit (HOSPITAL_COMMUNITY)
Admission: RE | Admit: 2014-10-17 | Discharge: 2014-10-17 | Disposition: A | Discharge status: Home / Self Care | Payer: Medicare Other | Source: Ambulatory Visit | Attending: Pulmonary Disease | Admitting: Pulmonary Disease

## 2014-10-17 ENCOUNTER — Encounter (HOSPITAL_COMMUNITY)
Admission: RE | Admit: 2014-10-17 | Discharge: 2014-10-17 | Disposition: A | Discharge status: Home / Self Care | Payer: Medicare Other | Source: Ambulatory Visit | Attending: Pulmonary Disease | Admitting: Pulmonary Disease

## 2014-10-17 DIAGNOSIS — E0590 Thyrotoxicosis, unspecified without thyrotoxic crisis or storm: Secondary | ICD-10-CM | POA: Diagnosis not present

## 2014-10-17 DIAGNOSIS — E042 Nontoxic multinodular goiter: Secondary | ICD-10-CM | POA: Diagnosis not present

## 2014-10-17 DIAGNOSIS — E0520 Thyrotoxicosis with toxic multinodular goiter without thyrotoxic crisis or storm: Secondary | ICD-10-CM | POA: Diagnosis not present

## 2014-10-17 MED ORDER — SODIUM PERTECHNETATE TC 99M INJECTION
10.0000 | Freq: Once | INTRAVENOUS | Status: AC | PRN
  Administered 2014-10-17: 10.4 via INTRAVENOUS

## 2014-10-29 DIAGNOSIS — E46 Unspecified protein-calorie malnutrition: Secondary | ICD-10-CM | POA: Diagnosis not present

## 2014-10-29 DIAGNOSIS — I119 Hypertensive heart disease without heart failure: Secondary | ICD-10-CM | POA: Diagnosis not present

## 2014-10-29 DIAGNOSIS — E0580 Other thyrotoxicosis without thyrotoxic crisis or storm: Secondary | ICD-10-CM | POA: Diagnosis not present

## 2014-10-29 DIAGNOSIS — N3281 Overactive bladder: Secondary | ICD-10-CM | POA: Diagnosis not present

## 2014-11-02 ENCOUNTER — Ambulatory Visit (HOSPITAL_COMMUNITY): Payer: Medicare Other

## 2014-11-02 ENCOUNTER — Other Ambulatory Visit (HOSPITAL_COMMUNITY): Payer: Self-pay | Admitting: Pulmonary Disease

## 2014-11-02 DIAGNOSIS — Z0000 Encounter for general adult medical examination without abnormal findings: Secondary | ICD-10-CM

## 2014-11-02 DIAGNOSIS — Z1231 Encounter for screening mammogram for malignant neoplasm of breast: Secondary | ICD-10-CM

## 2014-11-04 ENCOUNTER — Ambulatory Visit (HOSPITAL_COMMUNITY): Payer: Medicare Other | Admitting: Hematology & Oncology

## 2014-11-04 NOTE — Progress Notes (Signed)
This encounter was created in error - please disregard.

## 2014-11-08 ENCOUNTER — Encounter (HOSPITAL_COMMUNITY): Payer: Self-pay

## 2014-11-16 ENCOUNTER — Emergency Department (HOSPITAL_COMMUNITY)
Admission: EM | Admit: 2014-11-16 | Discharge: 2014-11-16 | Disposition: A | Discharge status: Home / Self Care | Payer: Medicare Other | Attending: Emergency Medicine | Admitting: Emergency Medicine

## 2014-11-16 ENCOUNTER — Encounter (HOSPITAL_COMMUNITY): Payer: Self-pay | Admitting: Emergency Medicine

## 2014-11-16 DIAGNOSIS — Z8679 Personal history of other diseases of the circulatory system: Secondary | ICD-10-CM | POA: Insufficient documentation

## 2014-11-16 DIAGNOSIS — Z8742 Personal history of other diseases of the female genital tract: Secondary | ICD-10-CM | POA: Diagnosis not present

## 2014-11-16 DIAGNOSIS — Z7902 Long term (current) use of antithrombotics/antiplatelets: Secondary | ICD-10-CM | POA: Insufficient documentation

## 2014-11-16 DIAGNOSIS — R350 Frequency of micturition: Secondary | ICD-10-CM | POA: Diagnosis present

## 2014-11-16 DIAGNOSIS — Z86018 Personal history of other benign neoplasm: Secondary | ICD-10-CM | POA: Insufficient documentation

## 2014-11-16 DIAGNOSIS — Z79899 Other long term (current) drug therapy: Secondary | ICD-10-CM | POA: Insufficient documentation

## 2014-11-16 DIAGNOSIS — Z853 Personal history of malignant neoplasm of breast: Secondary | ICD-10-CM | POA: Insufficient documentation

## 2014-11-16 DIAGNOSIS — N390 Urinary tract infection, site not specified: Secondary | ICD-10-CM | POA: Diagnosis not present

## 2014-11-16 DIAGNOSIS — E119 Type 2 diabetes mellitus without complications: Secondary | ICD-10-CM | POA: Diagnosis not present

## 2014-11-16 DIAGNOSIS — Z8673 Personal history of transient ischemic attack (TIA), and cerebral infarction without residual deficits: Secondary | ICD-10-CM | POA: Diagnosis not present

## 2014-11-16 DIAGNOSIS — Z8619 Personal history of other infectious and parasitic diseases: Secondary | ICD-10-CM | POA: Insufficient documentation

## 2014-11-16 DIAGNOSIS — I10 Essential (primary) hypertension: Secondary | ICD-10-CM | POA: Diagnosis not present

## 2014-11-16 DIAGNOSIS — E780 Pure hypercholesterolemia: Secondary | ICD-10-CM | POA: Diagnosis not present

## 2014-11-16 DIAGNOSIS — I2510 Atherosclerotic heart disease of native coronary artery without angina pectoris: Secondary | ICD-10-CM | POA: Diagnosis not present

## 2014-11-16 DIAGNOSIS — K219 Gastro-esophageal reflux disease without esophagitis: Secondary | ICD-10-CM | POA: Diagnosis not present

## 2014-11-16 LAB — URINALYSIS, ROUTINE W REFLEX MICROSCOPIC
Bilirubin Urine: NEGATIVE
Glucose, UA: NEGATIVE mg/dL
HGB URINE DIPSTICK: NEGATIVE
KETONES UR: NEGATIVE mg/dL
Nitrite: POSITIVE — AB
PROTEIN: NEGATIVE mg/dL
Specific Gravity, Urine: 1.01 (ref 1.005–1.030)
Urobilinogen, UA: 0.2 mg/dL (ref 0.0–1.0)
pH: 6.5 (ref 5.0–8.0)

## 2014-11-16 LAB — WET PREP, GENITAL
Clue Cells Wet Prep HPF POC: NONE SEEN
Trich, Wet Prep: NONE SEEN
YEAST WET PREP: NONE SEEN

## 2014-11-16 LAB — URINE MICROSCOPIC-ADD ON

## 2014-11-16 MED ORDER — CEPHALEXIN 500 MG PO CAPS: 500.0000 mg | ORAL_CAPSULE | Freq: Four times a day (QID) | ORAL | Status: DC

## 2014-11-16 NOTE — ED Provider Notes (Signed)
CSN: [id number]     Arrival date & time 11/16/14  0905 History   First MD Initiated Contact with Patient 11/16/14 [phone]     Chief Complaint  Patient presents with  . Urinary Frequency     (Consider location/radiation/quality/duration/timing/severity/associated sxs/prior Treatment) The history is provided by the patient.   Caroline Aguirre is a 81 y.o. female presenting with at least 2 month history of urinary frequency which has been diagnosis overactive bladder which has worsened over the past 2 days.  She denies fevers or chills, no nausea or emesis but states she has had slight burning with urination, same pattern of frequency, and also reports vaginal itching which started yesterday.  She denies vaginal discharge or bleeding.  She also denies hematuria.  She does have chronic back pain which is not worsened with the worsening of her urinary symptoms.  She has taken no medications prior to arrival.    Past Medical History  Diagnosis Date  . Herniated disc   . Cyst of breast     Right - at 18 yrs of age.  . Shingles   . Hardening of the arteries of the brain   . Stroke 2004    cerebral hemmorhage  . Diverticulitis   . Arthritis   . 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 Hx Breast cancer, DCIS, Right 08/19/2010  . Migraine headache   . SBO (small bowel obstruction) 09/02/11  . Elevated cholesterol   . Heart disease   . GERD (gastroesophageal reflux disease)   . Coronary artery disease   . Hypertension   . Cancer     Rt Breast  . Diabetes mellitus without complication    Past Surgical History  Procedure Laterality Date  . Brain surgery      hemorrhage  . Cystectomy    . Spine surgery      repair herniated disc  . Fibroid tumors    . Breast surgery  09/25/2010    mastectomy  . Knee arthroscopy Right     right knee x 3  . Laparoscopic bilateral salpingo oopherectomy     Family History  Problem Relation Age of Onset  . Cancer  Mother   . Heart disease Father   . Spinal muscular atrophy Sister     spinal meningitis   Social History  Substance Use Topics  . Smoking status: Never Smoker   . Smokeless tobacco: Never Used  . Alcohol Use: No   OB History    No data available     Review of Systems  Constitutional: Negative for fever and chills.  HENT: Negative.   Respiratory: Negative.   Gastrointestinal: Negative for nausea, vomiting, abdominal pain and constipation.  Genitourinary: Positive for dysuria and frequency. Negative for hematuria, vaginal discharge and vaginal pain.       Vaginal itching  Musculoskeletal: Negative for joint swelling, arthralgias and neck pain.  Skin: Negative.  Negative for rash and wound.  Neurological: Negative for weakness.      Allergies  Tape; Ciprofloxacin; Sulfa antibiotics; Codeine; and Morphine and related  Home Medications   Prior to Admission medications   Medication Sig Start Date End Date Taking? Authorizing Provider  amLODipine (NORVASC) 10 MG tablet Take 10 mg by mouth every morning.     Historical Provider, MD  atorvastatin (LIPITOR) 20 MG tablet Take 20 mg by mouth every morning.     Historical Provider, MD  calcium carbonate (  TUMS EX) 750 MG chewable tablet Chew 1 tablet by mouth 2 (two) times daily.     Historical Provider, MD  cephALEXin (KEFLEX) 500 MG capsule Take 1 capsule (500 mg total) by mouth 4 (four) times daily. 11/16/14   Evalee Jefferson, PA-C  clopidogrel (PLAVIX) 75 MG tablet Take 75 mg by mouth every morning.     Historical Provider, MD  losartan-hydrochlorothiazide (HYZAAR) 100-12.5 MG per tablet Take 1 tablet by mouth every morning.     Historical Provider, MD  Methylcellulose, Laxative, (CITRUCEL) 500 MG TABS Take 1 tablet by mouth daily.     Historical Provider, MD  metoprolol (LOPRESSOR) 50 MG tablet Take 25 mg by mouth 2 (two) times daily.     Historical Provider, MD  Multiple Vitamin (MULTIVITAMIN WITH MINERALS) TABS Take 1 tablet by mouth  every morning. *Contains NO Iron (Ferrous Sulfate and/or Iodine)*    Historical Provider, MD  nystatin-triamcinolone ointment (MYCOLOG) Apply 1 application topically 2 (two) times daily. To affected area.for two weeks then 1-3 times weekly as needed. 07/17/13   Jonnie Kind, MD  omeprazole (PRILOSEC) 20 MG capsule Take 20 mg by mouth every morning.     Historical Provider, MD  vitamin C (ASCORBIC ACID) 500 MG tablet Take 500 mg by mouth every morning.     Historical Provider, MD   BP 163/63 mmHg  Pulse 58  Temp(Src) 97.6 F (36.4 C) (Oral)  Resp 18  Wt 125 lb (56.7 kg)  SpO2 100% Physical Exam  Constitutional: She appears well-developed and well-nourished.  HENT:  Head: Normocephalic and atraumatic.  Eyes: Conjunctivae are normal.  Neck: Normal range of motion.  Cardiovascular: Normal rate, regular rhythm, normal heart sounds and intact distal pulses.   Pulmonary/Chest: Effort normal and breath sounds normal. She has no wheezes.  Abdominal: Soft. Bowel sounds are normal. There is no tenderness. There is no guarding.  Genitourinary: Uterus is not tender. Cervix exhibits no discharge. Right adnexum displays no tenderness. Left adnexum displays no tenderness. No tenderness in the vagina. No vaginal discharge found.  Atrophic  Musculoskeletal: Normal range of motion.  Neurological: She is alert.  Skin: Skin is warm and dry.  Psychiatric: She has a normal mood and affect.  Nursing note and vitals reviewed.   ED Course  Procedures (including critical care time) Labs Review Labs Reviewed  WET PREP, GENITAL - Abnormal; Notable for the following:    WBC, Wet Prep HPF POC FEW (*)    All other components within normal limits  URINALYSIS, ROUTINE W REFLEX MICROSCOPIC (NOT AT [facility]) - Abnormal; Notable for the following:    APPearance HAZY (*)    Nitrite POSITIVE (*)    Leukocytes, UA SMALL (*)    All other components within normal limits  URINE MICROSCOPIC-ADD ON - Abnormal; Notable  for the following:    Squamous Epithelial / LPF FEW (*)    Bacteria, UA MANY (*)    All other components within normal limits  URINE CULTURE    Imaging Review No results found. I have personally reviewed and evaluated these images and lab results as part of my medical decision-making.   EKG Interpretation None      MDM   Final diagnoses:  UTI (lower urinary tract infection)    Patients labs reviewed.  Urine culture was ordered.  Patient was placed on Keflex.  Encouraged increased fluid intake.  Follow-up with PCP if symptoms are not improving with this treatment.    Evalee Jefferson, PA-C 11/16/14  [place], MD 11/17/14 [phone]

## 2014-11-16 NOTE — ED Provider Notes (Signed)
Patient seen/examined in the Emergency Department in conjunction with Midlevel Provider  Patient reports urinary frequency Exam : awake/alert, no distress, abd soft Plan: stable for d/c and start abx   Ripley Fraise, MD 11/16/14 1020

## 2014-11-16 NOTE — ED Notes (Addendum)
Pt reports she is having urinary frequency with incontinence. Pt reports this has been going on for at least a couple of months. Pt denies any fevers, confusion, or urinary burning. Pt also reports vaginal itching for last few days,

## 2014-11-16 NOTE — Discharge Instructions (Signed)

## 2014-11-18 DIAGNOSIS — E40 Kwashiorkor: Secondary | ICD-10-CM | POA: Diagnosis not present

## 2014-11-18 DIAGNOSIS — I2510 Atherosclerotic heart disease of native coronary artery without angina pectoris: Secondary | ICD-10-CM | POA: Diagnosis not present

## 2014-11-18 DIAGNOSIS — E0580 Other thyrotoxicosis without thyrotoxic crisis or storm: Secondary | ICD-10-CM | POA: Diagnosis not present

## 2014-11-18 DIAGNOSIS — N3281 Overactive bladder: Secondary | ICD-10-CM | POA: Diagnosis not present

## 2014-11-18 LAB — URINE CULTURE
Culture: >=100000
Special Requests: NORMAL

## 2014-11-20 ENCOUNTER — Telehealth (HOSPITAL_BASED_OUTPATIENT_CLINIC_OR_DEPARTMENT_OTHER): Payer: Self-pay | Admitting: Emergency Medicine

## 2014-11-20 NOTE — Telephone Encounter (Signed)
Post ED Visit - Positive Culture Follow-up  Culture report reviewed by antimicrobial stewardship pharmacist: []  Wes Donnella Sham, Pharm.D., BCPS []  Heide Guile, Pharm.D., BCPS []  Alycia Rossetti, Pharm.D., BCPS []  [place], [place].D., BCPS, AAHIVP []  Legrand Como, Pharm.D., BCPS, AAHIVP []  Isac Sarna, Pharm.D., BCPS Dimitri Ped PharmD  Positive urine culture E. coli Treated with cephalexin, organism sensitive to the same and no further patient follow-up is required at this time.  Hazle Nordmann 11/20/2014, 12:20 PM

## 2014-12-04 ENCOUNTER — Other Ambulatory Visit (HOSPITAL_COMMUNITY): Payer: Self-pay | Admitting: Pulmonary Disease

## 2014-12-04 DIAGNOSIS — Z1231 Encounter for screening mammogram for malignant neoplasm of breast: Secondary | ICD-10-CM

## 2014-12-09 DIAGNOSIS — H4011X2 Primary open-angle glaucoma, moderate stage: Secondary | ICD-10-CM | POA: Diagnosis not present

## 2014-12-10 ENCOUNTER — Ambulatory Visit (HOSPITAL_COMMUNITY): Payer: Medicare Other

## 2014-12-11 ENCOUNTER — Ambulatory Visit (HOSPITAL_COMMUNITY)
Admission: RE | Admit: 2014-12-11 | Discharge: 2014-12-11 | Disposition: A | Discharge status: Home / Self Care | Payer: Medicare Other | Source: Ambulatory Visit | Attending: Pulmonary Disease | Admitting: Pulmonary Disease

## 2014-12-11 DIAGNOSIS — Z853 Personal history of malignant neoplasm of breast: Secondary | ICD-10-CM | POA: Diagnosis not present

## 2014-12-11 DIAGNOSIS — Z23 Encounter for immunization: Secondary | ICD-10-CM | POA: Diagnosis not present

## 2014-12-11 DIAGNOSIS — Z1231 Encounter for screening mammogram for malignant neoplasm of breast: Secondary | ICD-10-CM | POA: Diagnosis not present

## 2014-12-11 DIAGNOSIS — Z9011 Acquired absence of right breast and nipple: Secondary | ICD-10-CM | POA: Insufficient documentation

## 2014-12-18 ENCOUNTER — Encounter (HOSPITAL_COMMUNITY): Payer: Self-pay | Admitting: Hematology & Oncology

## 2014-12-18 ENCOUNTER — Encounter (HOSPITAL_COMMUNITY): Payer: Medicare Other | Attending: Hematology & Oncology | Admitting: Hematology & Oncology

## 2014-12-18 VITALS — BP 110/42 | HR 46 | Temp 97.5°F | Resp 18 | Wt 110.6 lb

## 2014-12-18 DIAGNOSIS — Z853 Personal history of malignant neoplasm of breast: Secondary | ICD-10-CM

## 2014-12-18 NOTE — Progress Notes (Signed)
Caroline Bogus, MD [place] [id number]  Hx Breast cancer, DCIS, Right - Plan: NM Bone Scan Whole Body  CURRENT THERAPY: Observation  INTERVAL HISTORY: Caroline Aguirre 81 y.o. 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 It was estrogen receptor-negative, progesterone receptor-negative, and she underwent mastectomy by Dr. Margot Chimes on 09/25/2010.  Caroline Aguirre is here alone today.    She says she's been losing weight, but is unable to explain why. She says she's been eating "good vegetables now from the garden."  She confirms that her taste is good, but that sometimes she will find something that tastes different. She says, "as a usual thing, I think I'm doing pretty good." She also confirms that her old clothes don't fit her anymore; they are way too big.  When asked if she's experiencing any new pain, Caroline Aguirre says she "hurts in the back." She has a heating pad, and that helps. She says her back's been hurting in the last few months.  Caroline Aguirre denies anything else new or different; she says "she can't think of anything."  When asked if she's experienced any falls lately, she first said no, but then said "well, yeah I did." She says it was back in the spring, and she tripped over a stool in her Aguirre. She said she could walk after she fell, so she felt that "ain't nothing broke."  Her son lives with her. She remarks that he takes care of himself, and he helps her out a lot.  She doesn't smoke; she says she "worked at [employer] for 38 years, but never smoked a single cigarette."  She denies any blood in her stools, but says she "sometimes has problems" with her bowels. She cannot remember if she's had a colonoscopy.  She sees Dr. Jeffie Pollock on the 30th. She says she doesn't know what she's seeing him for, but she's been seeing him for a while.     Past Medical History  Diagnosis Date  . Herniated disc   . Cyst of breast     Right - at 89 yrs of age.  . Shingles   . Hardening of the arteries of the brain   . Stroke 2004    cerebral hemmorhage  . Diverticulitis   . Arthritis   . 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 Hx Breast cancer, DCIS, Right 08/19/2010  . Migraine headache   . SBO (small bowel obstruction) 09/02/11  . Elevated cholesterol   . Heart disease   . GERD (gastroesophageal reflux disease)   . Coronary artery disease   . Hypertension   . Cancer     Rt Breast  . Diabetes mellitus without complication     has Herniated disc; Shingles; Hardening of the arteries of the brain; Stroke; Cyst of breast, right, solitary; Fatigue/loss of sleep; High blood pressure; Bronchitis; Asthma; Skin moles (abnormal); Gastroesophageal reflux disease with hiatal hernia; Hearing loss; Wears glasses and/or contacts; Glaucoma; Menopause; Hx Breast cancer, DCIS, Right; SBO (small bowel obstruction); and Vulvar irritation on her problem list.     is allergic to tape; ciprofloxacin; sulfa antibiotics; codeine; and morphine and related.  Caroline Aguirre does not currently have medications on file.  Past Surgical History  Procedure Laterality Date  . Brain surgery  hemorrhage  . Cystectomy    . Spine surgery      repair herniated disc  . Fibroid tumors    . Breast surgery  09/25/2010    mastectomy  . Knee arthroscopy Right     right knee x 3  . Laparoscopic bilateral salpingo oopherectomy      Review of Systems Positive for back pain and weight loss.  Denies any headaches, dizziness, double vision, fevers, chills, night sweats, nausea, vomiting, diarrhea, constipation, chest pain, heart palpitations, shortness of breath, blood in stool, black tarry stool, urinary pain, urinary burning, urinary frequency, hematuria.  14 point review of systems was performed and is negative except as  detailed under history of present illness and above     PHYSICAL EXAMINATION  ECOG PERFORMANCE STATUS: 1 - Symptomatic but completely ambulatory  Filed Vitals:   12/18/14 1000  BP: 110/42  Pulse: 46  Temp: 97.5 F (36.4 C)  Resp: 18    GENERAL:alert, no distress, well nourished, well developed, comfortable, cooperative and smiling  Able to ambulate to the exam table with some assistance. SKIN: skin color, texture, turgor are normal, no rashes or significant lesions HEAD: Normocephalic, No masses, lesions, tenderness or abnormalities EYES: normal, Conjunctiva are pink and non-injected EARS: External ears normal OROPHARYNX:lips, buccal mucosa, and tongue normal and mucous membranes are moist  NECK: supple, no adenopathy, trachea midline LYMPH:  no palpable lymphadenopathy, no hepatosplenomegaly BREAST:left breast normal without mass, skin or nipple changes or axillary nodes, right post-mastectomy site well healed and free of suspicious changes LUNGS: clear to auscultation and percussion HEART: regular rate & rhythm, no murmurs, no gallops, S1 normal and S2 normal ABDOMEN:abdomen soft, non-tender, normal bowel sounds, no masses or organomegaly and no hepatosplenomegaly BACK: Back symmetric, no curvature. EXTREMITIES:less then 2 second capillary refill, no joint deformities, effusion, or inflammation, no edema, no skin discoloration, no clubbing, no cyanosis  NEURO: alert & oriented x 3 with fluent speech, no focal motor/sensory deficits, gait normal   LABORATORY DATA: I have reviewed the data as listed.  CBC    Component Value Date/Time   WBC 7.5 11/01/2013 0941   RBC 4.70 11/01/2013 0941   HGB 12.9 11/01/2013 0941   HCT 39.1 11/01/2013 0941   PLT 297 11/01/2013 0941   MCV 83.2 11/01/2013 0941   MCH 27.4 11/01/2013 0941   MCHC 33.0 11/01/2013 0941   RDW 14.1 11/01/2013 0941   LYMPHSABS 2.3 11/01/2013 0941   MONOABS 0.7 11/01/2013 0941   EOSABS 0.1 11/01/2013 0941    BASOSABS 0.0 11/01/2013 0941   PATHOLOGY: 09/25/2010  Diagnosis Breast, simple mastectomy, Right - HIGH GRADE DUCTAL CARCINOMA IN SITU WITH ASSOCIATED COMEDONECROSIS, 8.2 CM. - RESECTION MARGINS ARE CLEAR. - HYALINIZED NODULE WITH ASSOCIATED DYSTROPHIC CALCIFICATION. - TWO LYMPH NODES, NEGATIVE FOR METASTATIC CARCINOMA (0/2). Microscopic Comment BREAST, IN SITU CARCINOMA Specimen, including laterality: Right breast. Procedure: Simple mastectomy. Lymph node sampling performed: Yes. Estimated tumor size (gross measurement): 8.2 cm. Histologic type: Ductal carcinoma in situ with comedonecrosis. Nuclear grade: High grade. Necrosis: Present. Treatment effect (if treated with neoadjuvant therapy): No. Distance to closest margin: Deep margin: 1 cm. Breast Prognostic Profile: Estrogen receptor: 0%, negative. Progesterone receptor: 0%, negative. ER/PR will be repeated and addendum report will follow. Axillary lymph nodes: Number examined: 2. Number with metastasis: 0. ITC (isolated tumor cells, < 0.2 mm): 0. Micrometastasis (> 0.102mm, < 39mm): 0. Metastasis > 58mm: 0. Extracapsular extension: 0. 1 of 3 FINAL for Rabenold, Halia C [phone]) Microscopic Comment(continued)  Method of detection of metastases: H&E only. TNM: pTis, pN0. Comment: Immunostains for CK AE1/AE3 and calponin were performed and the stains confirm the above diagnosis. Aldona Bar MD Pathologist, Electronic Signature (Case signed 09/29/2010)  RADIOLOGY Study Result     CLINICAL DATA: Screening. History of right breast cancer status post mastectomy. History of previous benign left breast biopsy.  EXAM: DIGITAL SCREENING UNILATERAL LEFT MAMMOGRAM WITH TOMO AND CAD  COMPARISON: Previous exam(s).  ACR Breast Density Category c: The breast tissue is heterogeneously dense, which may obscure small masses.  FINDINGS: Patient is status post right mastectomy for breast cancer. Biopsy site marker  within the upper left breast is stable in position. There are no new dominant masses, suspicious calcifications or secondary signs of malignancy identified in the left breast. Images were processed with CAD.  IMPRESSION: No mammographic evidence of malignancy. A result letter of this screening mammogram will be mailed directly to the patient.  RECOMMENDATION: Screening mammogram in one year. (Code:SM-B-01Y)  BI-RADS CATEGORY 2: Benign.   Electronically Signed  By: Franki Cabot M.D.  On: 12/12/2014 08:04     ASSESSMENT:  1. 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 It was estrogen receptor-negative, progesterone receptor-negative, and she underwent mastectomy by Dr. Margot Chimes on 09/25/2010. 2. Weight loss, 15 pound over past year 3. New back pain  PLAN:  1. I personally reviewed and went over laboratory results with the patient. 2. I personally reviewed and went over radiographic studies with the patient. 3. Lab work in 1 year: CBC diff, CMET 4. Recommend screening mammography in the near future.  Information regarding getting this appointment scheduled provided to the patient.  5. Bone scan given patient's new back pain and ongoing weight loss 6. Return 6 months for ongoing observation   Orders Placed This Encounter  Procedures  . NM Bone Scan Whole Body    Standing Status: Future     Number of Occurrences:      Standing Expiration Date: 12/18/2015    Order Specific Question:  Reason for Exam (SYMPTOM  OR DIAGNOSIS REQUIRED)    Answer:  history breast cancer, 16 pound weight loss, new back pain    Order Specific Question:  Preferred imaging location?    Answer:  [facility]   All questions were answered. The patient knows to call the clinic with any problems, questions or concerns. We can certainly see the patient much sooner if necessary.  This document serves as a record of services personally  performed by Ancil Linsey, MD. It was created on her behalf by Toni Amend, a trained medical scribe. The creation of this record is based on the scribe's personal observations and the provider's statements to them. This document has been checked and approved by the attending provider.  I have reviewed the above documentation for accuracy and completeness, and I agree with the above.  Molli Hazard, MD

## 2014-12-18 NOTE — Patient Instructions (Addendum)
[place] at [facility] Discharge Instructions  RECOMMENDATIONS MADE BY THE CONSULTANT AND ANY TEST RESULTS WILL BE SENT TO YOUR REFERRING PHYSICIAN.  Exam completed by Dr Whitney Muse today DEXA scheduled for Monday Return to see the doctor in 6 months Please call the clinic if you have any questions or concerns  Thank you for choosing [place] at [facility] to provide your oncology and hematology care.  To afford each patient quality time with our provider, please arrive at least 15 minutes before your scheduled appointment time.    You need to re-schedule your appointment should you arrive 10 or more minutes late.  We strive to give you quality time with our providers, and arriving late affects you and other patients whose appointments are after yours.  Also, if you no show three or more times for appointments you may be dismissed from the clinic at the providers discretion.     Again, thank you for choosing [facility].  Our hope is that these requests will decrease the amount of time that you wait before being seen by our physicians.       _____________________________________________________________  Should you have questions after your visit to [facility], please contact our office at [phone] between the hours of 8:30 a.m. and 4:30 p.m.  Voicemails left after 4:30 p.m. will not be returned until the following business day.  For prescription refill requests, have your pharmacy contact our office.

## 2014-12-20 ENCOUNTER — Ambulatory Visit (INDEPENDENT_AMBULATORY_CARE_PROVIDER_SITE_OTHER): Payer: Medicare Other | Admitting: Urology

## 2014-12-20 ENCOUNTER — Encounter (HOSPITAL_COMMUNITY): Payer: Self-pay | Admitting: Hematology & Oncology

## 2014-12-20 DIAGNOSIS — N390 Urinary tract infection, site not specified: Secondary | ICD-10-CM

## 2014-12-20 DIAGNOSIS — N3946 Mixed incontinence: Secondary | ICD-10-CM | POA: Diagnosis not present

## 2014-12-20 DIAGNOSIS — R351 Nocturia: Secondary | ICD-10-CM

## 2014-12-23 ENCOUNTER — Encounter (HOSPITAL_COMMUNITY)
Admission: RE | Admit: 2014-12-23 | Discharge: 2014-12-23 | Disposition: A | Discharge status: Home / Self Care | Payer: Medicare Other | Source: Ambulatory Visit | Attending: Hematology & Oncology | Admitting: Hematology & Oncology

## 2014-12-23 ENCOUNTER — Encounter (HOSPITAL_COMMUNITY): Payer: Self-pay

## 2014-12-23 DIAGNOSIS — M542 Cervicalgia: Secondary | ICD-10-CM | POA: Diagnosis not present

## 2014-12-23 DIAGNOSIS — Z853 Personal history of malignant neoplasm of breast: Secondary | ICD-10-CM | POA: Diagnosis not present

## 2014-12-23 MED ORDER — TECHNETIUM TC 99M MEDRONATE IV KIT
25.0000 | PACK | Freq: Once | INTRAVENOUS | Status: AC | PRN
  Administered 2014-12-23: 25.9 via INTRAVENOUS

## 2015-01-31 ENCOUNTER — Ambulatory Visit (INDEPENDENT_AMBULATORY_CARE_PROVIDER_SITE_OTHER): Payer: Medicare Other | Admitting: Urology

## 2015-01-31 DIAGNOSIS — N3020 Other chronic cystitis without hematuria: Secondary | ICD-10-CM

## 2015-01-31 DIAGNOSIS — N952 Postmenopausal atrophic vaginitis: Secondary | ICD-10-CM | POA: Diagnosis not present

## 2015-01-31 DIAGNOSIS — N3946 Mixed incontinence: Secondary | ICD-10-CM | POA: Diagnosis not present

## 2015-03-16 ENCOUNTER — Inpatient Hospital Stay (HOSPITAL_COMMUNITY)
Admission: EM | Admit: 2015-03-16 | Discharge: 2015-03-20 | DRG: 064 | Disposition: A | Discharge status: Home Health | Payer: Medicare Other | Attending: Pulmonary Disease | Admitting: Pulmonary Disease

## 2015-03-16 ENCOUNTER — Emergency Department (HOSPITAL_COMMUNITY): Payer: Medicare Other

## 2015-03-16 ENCOUNTER — Encounter (HOSPITAL_COMMUNITY): Payer: Self-pay | Admitting: Emergency Medicine

## 2015-03-16 DIAGNOSIS — G9340 Encephalopathy, unspecified: Secondary | ICD-10-CM | POA: Diagnosis present

## 2015-03-16 DIAGNOSIS — Z8249 Family history of ischemic heart disease and other diseases of the circulatory system: Secondary | ICD-10-CM | POA: Diagnosis not present

## 2015-03-16 DIAGNOSIS — I6350 Cerebral infarction due to unspecified occlusion or stenosis of unspecified cerebral artery: Secondary | ICD-10-CM | POA: Diagnosis not present

## 2015-03-16 DIAGNOSIS — K219 Gastro-esophageal reflux disease without esophagitis: Secondary | ICD-10-CM | POA: Diagnosis present

## 2015-03-16 DIAGNOSIS — Z8673 Personal history of transient ischemic attack (TIA), and cerebral infarction without residual deficits: Secondary | ICD-10-CM

## 2015-03-16 DIAGNOSIS — I10 Essential (primary) hypertension: Secondary | ICD-10-CM | POA: Diagnosis not present

## 2015-03-16 DIAGNOSIS — E0590 Thyrotoxicosis, unspecified without thyrotoxic crisis or storm: Secondary | ICD-10-CM | POA: Diagnosis present

## 2015-03-16 DIAGNOSIS — E1169 Type 2 diabetes mellitus with other specified complication: Secondary | ICD-10-CM

## 2015-03-16 DIAGNOSIS — Z7902 Long term (current) use of antithrombotics/antiplatelets: Secondary | ICD-10-CM | POA: Diagnosis not present

## 2015-03-16 DIAGNOSIS — I6789 Other cerebrovascular disease: Secondary | ICD-10-CM | POA: Diagnosis not present

## 2015-03-16 DIAGNOSIS — I63512 Cerebral infarction due to unspecified occlusion or stenosis of left middle cerebral artery: Principal | ICD-10-CM | POA: Diagnosis present

## 2015-03-16 DIAGNOSIS — R001 Bradycardia, unspecified: Secondary | ICD-10-CM | POA: Diagnosis present

## 2015-03-16 DIAGNOSIS — R4182 Altered mental status, unspecified: Secondary | ICD-10-CM | POA: Diagnosis not present

## 2015-03-16 DIAGNOSIS — R569 Unspecified convulsions: Secondary | ICD-10-CM | POA: Diagnosis not present

## 2015-03-16 DIAGNOSIS — R410 Disorientation, unspecified: Secondary | ICD-10-CM

## 2015-03-16 DIAGNOSIS — Z853 Personal history of malignant neoplasm of breast: Secondary | ICD-10-CM | POA: Diagnosis not present

## 2015-03-16 DIAGNOSIS — I2510 Atherosclerotic heart disease of native coronary artery without angina pectoris: Secondary | ICD-10-CM | POA: Diagnosis not present

## 2015-03-16 DIAGNOSIS — Z9011 Acquired absence of right breast and nipple: Secondary | ICD-10-CM | POA: Diagnosis not present

## 2015-03-16 DIAGNOSIS — Z809 Family history of malignant neoplasm, unspecified: Secondary | ICD-10-CM | POA: Diagnosis not present

## 2015-03-16 DIAGNOSIS — E119 Type 2 diabetes mellitus without complications: Secondary | ICD-10-CM | POA: Diagnosis present

## 2015-03-16 DIAGNOSIS — S0990XA Unspecified injury of head, initial encounter: Secondary | ICD-10-CM | POA: Diagnosis not present

## 2015-03-16 DIAGNOSIS — R339 Retention of urine, unspecified: Secondary | ICD-10-CM | POA: Diagnosis not present

## 2015-03-16 DIAGNOSIS — M1990 Unspecified osteoarthritis, unspecified site: Secondary | ICD-10-CM | POA: Diagnosis present

## 2015-03-16 DIAGNOSIS — E7800 Pure hypercholesterolemia, unspecified: Secondary | ICD-10-CM | POA: Diagnosis present

## 2015-03-16 DIAGNOSIS — R4701 Aphasia: Secondary | ICD-10-CM | POA: Diagnosis present

## 2015-03-16 DIAGNOSIS — R330 Drug induced retention of urine: Secondary | ICD-10-CM | POA: Diagnosis present

## 2015-03-16 DIAGNOSIS — N179 Acute kidney failure, unspecified: Secondary | ICD-10-CM | POA: Diagnosis not present

## 2015-03-16 DIAGNOSIS — T450X5A Adverse effect of antiallergic and antiemetic drugs, initial encounter: Secondary | ICD-10-CM | POA: Diagnosis present

## 2015-03-16 DIAGNOSIS — R32 Unspecified urinary incontinence: Secondary | ICD-10-CM | POA: Diagnosis not present

## 2015-03-16 DIAGNOSIS — I639 Cerebral infarction, unspecified: Secondary | ICD-10-CM | POA: Diagnosis present

## 2015-03-16 DIAGNOSIS — I63312 Cerebral infarction due to thrombosis of left middle cerebral artery: Secondary | ICD-10-CM

## 2015-03-16 LAB — URINALYSIS, ROUTINE W REFLEX MICROSCOPIC
BILIRUBIN URINE: NEGATIVE
Glucose, UA: NEGATIVE mg/dL
KETONES UR: NEGATIVE mg/dL
Nitrite: NEGATIVE
PH: 6.5 (ref 5.0–8.0)
Protein, ur: NEGATIVE mg/dL

## 2015-03-16 LAB — COMPREHENSIVE METABOLIC PANEL
ALBUMIN: 3.9 g/dL (ref 3.5–5.0)
ALT: 73 U/L — ABNORMAL HIGH (ref 14–54)
ANION GAP: 7 (ref 5–15)
AST: 74 U/L — ABNORMAL HIGH (ref 15–41)
Alkaline Phosphatase: 101 U/L (ref 38–126)
BUN: 28 mg/dL — ABNORMAL HIGH (ref 6–20)
CHLORIDE: 106 mmol/L (ref 101–111)
CO2: 27 mmol/L (ref 22–32)
Calcium: 8.8 mg/dL — ABNORMAL LOW (ref 8.9–10.3)
Creatinine, Ser: 1.93 mg/dL — ABNORMAL HIGH (ref 0.44–1.00)
GFR calc Af Amer: 26 mL/min — ABNORMAL LOW (ref >60)
GFR calc non Af Amer: 22 mL/min — ABNORMAL LOW (ref >60)
GLUCOSE: 134 mg/dL — AB (ref 65–99)
POTASSIUM: 4.5 mmol/L (ref 3.5–5.1)
Sodium: 140 mmol/L (ref 135–145)
Total Bilirubin: 0.3 mg/dL (ref 0.3–1.2)
Total Protein: 6.3 g/dL — ABNORMAL LOW (ref 6.5–8.1)

## 2015-03-16 LAB — CBC WITH DIFFERENTIAL/PLATELET
Basophils Absolute: 0 10*3/uL (ref 0.0–0.1)
Basophils Relative: 0 %
Eosinophils Absolute: 0.2 10*3/uL (ref 0.0–0.7)
Eosinophils Relative: 2 %
HCT: 35.4 % — ABNORMAL LOW (ref 36.0–46.0)
Hemoglobin: 11.7 g/dL — ABNORMAL LOW (ref 12.0–15.0)
Lymphocytes Relative: 19 %
Lymphs Abs: 1.6 10*3/uL (ref 0.7–4.0)
MCH: 28.7 pg (ref 26.0–34.0)
MCHC: 33.1 g/dL (ref 30.0–36.0)
MCV: 86.8 fL (ref 78.0–100.0)
MONO ABS: 1 10*3/uL (ref 0.1–1.0)
MONOS PCT: 12 %
Neutro Abs: 5.8 10*3/uL (ref 1.7–7.7)
Neutrophils Relative %: 67 %
PLATELETS: 218 10*3/uL (ref 150–400)
RBC: 4.08 MIL/uL (ref 3.87–5.11)
RDW: 14.1 % (ref 11.5–15.5)
WBC: 8.5 10*3/uL (ref 4.0–10.5)

## 2015-03-16 LAB — URINE MICROSCOPIC-ADD ON

## 2015-03-16 LAB — TROPONIN I: Troponin I: <0.03 ng/mL (ref <0.031)

## 2015-03-16 NOTE — ED Provider Notes (Addendum)
CSN: [id number]     Arrival date & time 03/16/15  2012 History  By signing my name below, I, Caroline Aguirre, attest that this documentation has been prepared under the direction and in the presence of Sinda Du, MD. Electronically Signed: Terressa Aguirre, ED Scribe. 03/16/2015. 8:29 PM.   LEVEL 5 CAVEAT: AMS   Chief Complaint  Patient presents with  . Fall   The history is provided by the patient and the EMS personnel. No language interpreter was used.   PCP: Alonza Bogus, MD HPI Comments: Caroline Aguirre is a 81 y.o. female, who lives with her son, with PMHx noted below including subacute urinary incontinence (onset months ago), DM, who presents to the Emergency Department complaining of two falls today. Associated Sx include confusion and AMS. Pt denies cough, any pain, fever, diarrhea, vomiting, chest pain, SOB. Pt is not accompanied by any family members during exam.   Family arrives later and states that the patient has had 2 falls today, one of the falls was down the stairs and the other was out of the seat, no obvious loss of consciousness or head injury. Neighbor states that she is not acting or talking right like she usually does but this is nonspecific and cannot give any more details  Past Medical History  Diagnosis Date  . Herniated disc   . Cyst of breast     Right - at 65 yrs of age.  . Shingles   . Hardening of the arteries of the brain   . Stroke [facility]) 2004    cerebral hemmorhage  . Diverticulitis   . Arthritis   . 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 Hx Breast cancer, DCIS, Right 08/19/2010  . Migraine headache   . SBO (small bowel obstruction) ([place]) 09/02/11  . Elevated cholesterol   . Heart disease   . GERD (gastroesophageal reflux disease)   . Coronary artery disease   . Hypertension   . Cancer ([place])     Rt Breast  . Diabetes mellitus without complication [facility])    Past Surgical History  Procedure  Laterality Date  . Brain surgery      hemorrhage  . Cystectomy    . Spine surgery      repair herniated disc  . Fibroid tumors    . Breast surgery  09/25/2010    mastectomy  . Knee arthroscopy Right     right knee x 3  . Laparoscopic bilateral salpingo oopherectomy     Family History  Problem Relation Age of Onset  . Cancer Mother   . Heart disease Father   . Spinal muscular atrophy Sister     spinal meningitis   Social History  Substance Use Topics  . Smoking status: Never Smoker   . Smokeless tobacco: Never Used  . Alcohol Use: No   OB History    No data available     Review of Systems  Unable to perform ROS: Mental status change   Allergies  Tape; Ciprofloxacin; Sulfa antibiotics; Codeine; and Morphine and related  Home Medications   Prior to Admission medications   Medication Sig Start Date End Date Taking? Authorizing Provider  amLODipine (NORVASC) 10 MG tablet Take 10 mg by mouth every morning.     Historical Provider, MD  atorvastatin (LIPITOR) 20 MG tablet Take 20 mg by mouth every morning.     Historical Provider, MD  calcium carbonate (TUMS EX) 750  MG chewable tablet Chew 1 tablet by mouth 2 (two) times daily.     Historical Provider, MD  cephALEXin (KEFLEX) 500 MG capsule Take 1 capsule (500 mg total) by mouth 4 (four) times daily. 11/16/14   Evalee Jefferson, PA-C  clopidogrel (PLAVIX) 75 MG tablet Take 75 mg by mouth every morning.     Historical Provider, MD  losartan-hydrochlorothiazide (HYZAAR) 100-12.5 MG per tablet Take 1 tablet by mouth every morning.     Historical Provider, MD  Methylcellulose, Laxative, (CITRUCEL) 500 MG TABS Take 1 tablet by mouth daily.     Historical Provider, MD  metoprolol (LOPRESSOR) 50 MG tablet Take 25 mg by mouth 2 (two) times daily.     Historical Provider, MD  Multiple Vitamin (MULTIVITAMIN WITH MINERALS) TABS Take 1 tablet by mouth every morning. *Contains NO Iron (Ferrous Sulfate and/or Iodine)*    Historical Provider, MD   nystatin-triamcinolone ointment (MYCOLOG) Apply 1 application topically 2 (two) times daily. To affected area.for two weeks then 1-3 times weekly as needed. 07/17/13   Jonnie Kind, MD  omeprazole (PRILOSEC) 20 MG capsule Take 20 mg by mouth every morning.     Historical Provider, MD  vitamin C (ASCORBIC ACID) 500 MG tablet Take 500 mg by mouth every morning.     Historical Provider, MD   Triage Vitals: BP 147/52 mmHg  Pulse 53  Temp(Src) 98.3 F (36.8 C) (Oral)  Resp 20  Ht 5\' 3"  (1.6 m)  Wt 130 lb (58.968 kg)  BMI 23.03 kg/m2  SpO2 100% Physical Exam  Constitutional: She appears well-developed and well-nourished. No distress.  HENT:  Head: Normocephalic and atraumatic.  Mouth/Throat: Oropharynx is clear and moist. No oropharyngeal exudate.  Eyes: Conjunctivae and EOM are normal. Pupils are equal, round, and reactive to light. Right eye exhibits no discharge. Left eye exhibits no discharge. No scleral icterus.  Neck: Normal range of motion. Neck supple. No JVD present. No thyromegaly present.  Cardiovascular: Normal rate, regular rhythm, normal heart sounds and intact distal pulses.  Exam reveals no gallop and no friction rub.   No murmur heard. Pulmonary/Chest: Effort normal and breath sounds normal. No respiratory distress. She has no wheezes. She has no rales.  Abdominal: Soft. Bowel sounds are normal. She exhibits no distension and no mass. There is no tenderness.  Musculoskeletal: Normal range of motion. She exhibits no edema or tenderness.  Lymphadenopathy:    She has no cervical adenopathy.  Neurological: She is alert. Coordination normal.  Normal cranial nerves III through XII, finger-nose-finger is normal, no limb ataxia, no pronator drift, normal strength and sensation in all 4 extremities, some disorientation to date, time and location Difficulty answering some questions. Loses train of thought frequently.   Skin: Skin is warm and dry. No rash noted. No erythema.   Psychiatric: She has a normal mood and affect. Her behavior is normal.  Nursing note and vitals reviewed.   ED Course  Procedures (including critical care time) DIAGNOSTIC STUDIES: Oxygen Saturation is 100% on ra, nl by my interpretation.    Labs Review Labs Reviewed  CBC WITH DIFFERENTIAL/PLATELET - Abnormal; Notable for the following:    Hemoglobin 11.7 (*)    HCT 35.4 (*)    All other components within normal limits  COMPREHENSIVE METABOLIC PANEL - Abnormal; Notable for the following:    Glucose, Bld 134 (*)    BUN 28 (*)    Creatinine, Ser 1.93 (*)    Calcium 8.8 (*)    Total  Protein 6.3 (*)    AST 74 (*)    ALT 73 (*)    GFR calc non Af Amer 22 (*)    GFR calc Af Amer 26 (*)    All other components within normal limits  URINALYSIS, ROUTINE W REFLEX MICROSCOPIC (NOT AT [facility]) - Abnormal; Notable for the following:    Specific Gravity, Urine <1.005 (*)    Hgb urine dipstick TRACE (*)    Leukocytes, UA SMALL (*)    All other components within normal limits  URINE MICROSCOPIC-ADD ON - Abnormal; Notable for the following:    Squamous Epithelial / LPF 0-5 (*)    Bacteria, UA RARE (*)    All other components within normal limits  URINE CULTURE  TROPONIN I    Imaging Review Ct Head Wo Contrast  03/16/2015  CLINICAL DATA:  Two fell today.  Altered mental status EXAM: CT HEAD WITHOUT CONTRAST TECHNIQUE: Contiguous axial images were obtained from the base of the skull through the vertex without intravenous contrast. COMPARISON:  Brain MRI 08/16/2007 FINDINGS: There is gyriform high density cortical parenchyma in the posterior parietal temporal lobe (Image [id number] of series 20 which is felt to relate relate to a prior hemorrhagic infarction described on MRI of 08/16/2007. No evidence of acute intracranial hemorrhage. No extra-axial fluid collections. Visualized cortical atrophy. Extensive periventricular subcortical white matter hypodensities. No evidence skull fracture.  Basilar  cisterns are patent. None sinuses are clear. IMPRESSION: 1. High-density gyriform cortex in the posterior medial parietal lobe is felt to relate to remote hemorrhagic infarction and is not favored to represent acute hemorrhage. 2. Cortical atrophy and extensive white matter microvascular disease similar prior. 3. No acute intracranial findings. Electronically Signed   By: Suzy Bouchard M.D.   On: 03/16/2015 21:30   I have personally reviewed and evaluated these images and lab results as part of my medical decision-making.   EKG Interpretation   Date/Time:  Sunday March 16 2015 20:29:05 EST Ventricular Rate:  54 PR Interval:  171 QRS Duration: 110 QT Interval:  462 QTC Calculation: 438 R Axis:   79 Text Interpretation:  Sinus bradycardia Since last tracing rate slower  Abnormal ekg Confirmed by Sabra Heck  MD, Bartlomiej Jenkinson ([id number]) on 03/16/2015 10:48:10  PM      MDM   Final diagnoses:  Acute renal failure, unspecified acute renal failure type (HCC)  Altered mental status, unspecified altered mental status type    The patient's bedside ultrasound shows that she has retained urine in the bladder, she has a large distended bladder, this may be the source of her acute renal failure, postobstructive uropathy, will drain with Foley catheter, will admit to hospitalist overnight due to bumping creatinine and altered mental status.  EMERGENCY DEPARTMENT US RENAL EXAM  "Study: Limited Retroperitoneal Ultrasound of Kidneys"  INDICATIONS: kidney failure  Long and short axis of both kidneys were obtained.   PERFORMED BY: Myself  IMAGES ARCHIVED?: Yes  LIMITATIONS: Body habitus  VIEWS USED: Long axis and Short axis   INTERPRETATION: No Hydronephrosis - urinary retention with distended urinary bladder   CPT Code: [phone] (limited retroperitoneal)   I personally performed the services described in this documentation, which was scribed in my presence. The recorded information has been  reviewed and is accurate.        Noemi Chapel, MD 03/16/15 [id number]  Noemi Chapel, MD 04/12/15 [phone]

## 2015-03-16 NOTE — H&P (Signed)
[facility] Admission History and Physical       Caroline Aguirre [id number] DOB: 1924-11-28 DOA: 03/16/2015  Referring physician: EDP PCP: Alonza Bogus, MD  Specialists:   Chief Complaint: Confusion  HPI: Caroline Aguirre is a 81 y.o. female with a remote hx of Breast Cancer 1998 S/p Right Mastectomy, CAD, HTN, DM2 who presents to the ED with increased confusion and 2 falls since the afternoon.   Per her son who is at the bedside, she was talking out of her head.  Patient at baseline is alert and oriented. She has not had any fevers chills or complaints of chest pain.   She reports taking increased Benadryl lately. A CT scan of the Head was performed and was negative for acute findings.  She was also found to have a large post voiding residual and a Foley catheter was placed.   Her labs revealed an elevation in her BUN/Cr and she was started on IVFs and referred for medical observation.       Review of Systems: Unable to Obtain from the Patient Constitutional: No Weight Loss, No Weight Gain, Night Sweats, Fevers, Chills, Dizziness, Light Headedness, Fatigue, or Generalized Weakness HEENT: No Headaches, Difficulty Swallowing,Tooth/Dental Problems,Sore Throat,  No Sneezing, Rhinitis, Ear Ache, Nasal Congestion, or Post Nasal Drip,  Cardio-vascular:  No Chest pain, Orthopnea, PND, Edema in Lower Extremities, Anasarca, Dizziness, Palpitations  Resp: No Dyspnea, No DOE, No Productive Cough, No Non-Productive Cough, No Hemoptysis, No Wheezing.    GI: No Heartburn, Indigestion, Abdominal Pain, Nausea, Vomiting, Diarrhea, Constipation, Hematemesis, Hematochezia, Melena, Change in Bowel Habits,  Loss of Appetite  GU: No Dysuria, No Change in Color of Urine, No Urgency or Urinary Frequency,+Urinary Incontinence, No Flank pain.  Musculoskeletal: No Joint Pain or Swelling, No Decreased Range of Motion, No Back Pain.  Neurologic: No Syncope, No Seizures, Muscle Weakness, Paresthesia,  Vision Disturbance or Loss, No Diplopia, No Vertigo, No Difficulty Walking,  Skin: No Rash or Lesions. Psych: No Change in Mood or Affect, No Depression or Anxiety, No Memory loss, +Confusion, or Hallucinations   Past Medical History  Diagnosis Date  . Herniated disc   . Cyst of breast     Right - at 45 yrs of age.  . Shingles   . Hardening of the arteries of the brain   . Stroke [facility]) 2004    cerebral hemmorhage  . Diverticulitis   . Arthritis   . 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 Hx Breast cancer, DCIS, Right 08/19/2010  . Migraine headache   . SBO (small bowel obstruction) ([place]) 09/02/11  . Elevated cholesterol   . Heart disease   . GERD (gastroesophageal reflux disease)   . Coronary artery disease   . Hypertension   . Cancer ([place])     Rt Breast  . Diabetes mellitus without complication [facility])      Past Surgical History  Procedure Laterality Date  . Brain surgery      hemorrhage  . Cystectomy    . Spine surgery      repair herniated disc  . Fibroid tumors    . Breast surgery  09/25/2010    mastectomy  . Knee arthroscopy Right     right knee x 3  . Laparoscopic bilateral salpingo oopherectomy        Prior to Admission medications   Medication Sig Start Date End Date Taking? Authorizing Provider  amLODipine (NORVASC) 10  MG tablet Take 10 mg by mouth every morning.     Historical Provider, MD  atorvastatin (LIPITOR) 20 MG tablet Take 20 mg by mouth every morning.     Historical Provider, MD  calcium carbonate (TUMS EX) 750 MG chewable tablet Chew 1 tablet by mouth 2 (two) times daily.     Historical Provider, MD  cephALEXin (KEFLEX) 500 MG capsule Take 1 capsule (500 mg total) by mouth 4 (four) times daily. 11/16/14   Evalee Jefferson, PA-C  clopidogrel (PLAVIX) 75 MG tablet Take 75 mg by mouth every morning.     Historical Provider, MD  losartan-hydrochlorothiazide (HYZAAR) 100-12.5 MG per tablet Take 1 tablet by mouth every  morning.     Historical Provider, MD  Methylcellulose, Laxative, (CITRUCEL) 500 MG TABS Take 1 tablet by mouth daily.     Historical Provider, MD  metoprolol (LOPRESSOR) 50 MG tablet Take 25 mg by mouth 2 (two) times daily.     Historical Provider, MD  Multiple Vitamin (MULTIVITAMIN WITH MINERALS) TABS Take 1 tablet by mouth every morning. *Contains NO Iron (Ferrous Sulfate and/or Iodine)*    Historical Provider, MD  nystatin-triamcinolone ointment (MYCOLOG) Apply 1 application topically 2 (two) times daily. To affected area.for two weeks then 1-3 times weekly as needed. 07/17/13   Jonnie Kind, MD  omeprazole (PRILOSEC) 20 MG capsule Take 20 mg by mouth every morning.     Historical Provider, MD  vitamin C (ASCORBIC ACID) 500 MG tablet Take 500 mg by mouth every morning.     Historical Provider, MD     Allergies  Allergen Reactions  . Tape Rash  . Ciprofloxacin   . Sulfa Antibiotics Hives  . Codeine Nausea Only    nausea.  . Morphine And Related Nausea Only    Nausea only.    Social History:  reports that she has never smoked. She has never used smokeless tobacco. She reports that she does not drink alcohol or use illicit drugs.    Family History  Problem Relation Age of Onset  . Cancer Mother   . Heart disease Father   . Spinal muscular atrophy Sister     spinal meningitis       Physical Exam:  GEN:  Pleasant Thin Elderly 81 y.o. Caucasian female examined and in no acute distress; cooperative with exam Filed Vitals:   03/16/15 2025 03/16/15 2027 03/16/15 2030 03/16/15 2200  BP: 147/52  136/48 127/50  Pulse: 53   50  Temp: 98.3 F (36.8 C)     TempSrc: Oral     Resp: 20  18 14   Height: 5\' 3"  (1.6 m)     Weight:  58.968 kg (130 lb)    SpO2: 100%   100%   Blood pressure 127/50, pulse 50, temperature 98.3 F (36.8 C), temperature source Oral, resp. rate 14, height 5\' 3"  (1.6 m), weight 58.968 kg (130 lb), SpO2 100 %. PSYCH: She is alert and oriented x 2; does not  appear anxious does not appear depressed; affect is normal HEENT: Normocephalic and Atraumatic, Mucous membranes pink; PERRLA; EOM intact; Fundi:  Benign;  No scleral icterus, Nares: Patent, Oropharynx: Clear, Fair Dentition,    Neck:  FROM, No Cervical Lymphadenopathy nor Thyromegaly or Carotid Bruit; No JVD; Breasts:: Not examined CHEST WALL: No tenderness CHEST: Normal respiration, clear to auscultation bilaterally HEART: Regular rate and rhythm; no murmurs rubs or gallops BACK: No kyphosis or scoliosis; No CVA tenderness ABDOMEN: Positive Bowel Sounds, Soft Non-Tender, No Rebound  or Guarding; No Masses, No Organomegaly, . Rectal Exam: Not done EXTREMITIES: No Cyanosis, Clubbing, or Edema; No Ulcerations. Genitalia: not examined PULSES: 2+ and symmetric SKIN: Normal hydration no rash or ulceration CNS:  Alert and Oriented x 2, No Focal Deficits Vascular: pulses palpable throughout    Labs on Admission:  Basic Metabolic Panel:  Recent Labs Lab 03/16/15 2134  NA 140  K 4.5  CL 106  CO2 27  GLUCOSE 134*  BUN 28*  CREATININE 1.93*  CALCIUM 8.8*   Liver Function Tests:  Recent Labs Lab 03/16/15 2134  AST 74*  ALT 73*  ALKPHOS 101  BILITOT 0.3  PROT 6.3*  ALBUMIN 3.9   No results for input(s): LIPASE, AMYLASE in the last 168 hours. No results for input(s): AMMONIA in the last 168 hours. CBC:  Recent Labs Lab 03/16/15 2134  WBC 8.5  NEUTROABS 5.8  HGB 11.7*  HCT 35.4*  MCV 86.8  PLT 218   Cardiac Enzymes:  Recent Labs Lab 03/16/15 2134  TROPONINI <0.03    BNP (last 3 results) No results for input(s): BNP in the last 8760 hours.  ProBNP (last 3 results) No results for input(s): PROBNP in the last 8760 hours.  CBG: No results for input(s): GLUCAP in the last 168 hours.  Radiological Exams on Admission: Ct Head Wo Contrast  03/16/2015  CLINICAL DATA:  Two fell today.  Altered mental status EXAM: CT HEAD WITHOUT CONTRAST TECHNIQUE: Contiguous  axial images were obtained from the base of the skull through the vertex without intravenous contrast. COMPARISON:  Brain MRI 08/16/2007 FINDINGS: There is gyriform high density cortical parenchyma in the posterior parietal temporal lobe (Image 16-22 of series 20 which is felt to relate relate to a prior hemorrhagic infarction described on MRI of 08/16/2007. No evidence of acute intracranial hemorrhage. No extra-axial fluid collections. Visualized cortical atrophy. Extensive periventricular subcortical white matter hypodensities. No evidence skull fracture.  Basilar cisterns are patent. None sinuses are clear. IMPRESSION: 1. High-density gyriform cortex in the posterior medial parietal lobe is felt to relate to remote hemorrhagic infarction and is not favored to represent acute hemorrhage. 2. Cortical atrophy and extensive white matter microvascular disease similar prior. 3. No acute intracranial findings. Electronically Signed   By: Suzy Bouchard M.D.   On: 03/16/2015 21:30     EKG: Independently reviewed. Sinus Bradycardia  Rate = 54   Assessment/Plan:   81 y.o. female with  Principal Problem:    Acute encephalopathy   Cardiac Monitoring   Check TSH, and Ammonia Levels   Active Problems:    Urinary retention- due to Benadryl   Foley Catheter placed   Strict I/Os      AKI (acute kidney injury) ([place])   IVFs   Hold Losartan /HCTZ Rx   Monitor BUN/Cr      DM type 2 (diabetes mellitus, type 2) (H[facility])   SSI coverage PRN      Coronary artery disease   On Metoprolol and Losartan/HCTz, and Atorvastatin Rx    Both on Hold    Cardiac monitoring      Essential hypertension   On Metoprolol, Losartan HCTZ Rx at Home   Monitor BPs      Sinus bradycardia- due to Beta Blocker, or rule out Cardiac infarction   Hold Metoprolol Rx   Check Troponins       DVT Prophylaxis   Lovenox    Code Status:     FULL CODE  Family Communication:   Son at Bedside   Disposition Plan:    Observation Status        Time spent:  24 Minutes      Theressa Millard [facility] Pager [phone]   If [place] Please Contact the Day Rounding Team MD for [facility]  If 7PM-7AM, Please Contact Night-Floor Coverage  [email or web address] Password [email or web address] 03/16/2015, 10:59 PM     ADDENDUM:   Patient was seen and examined on 03/16/2015

## 2015-03-16 NOTE — ED Notes (Signed)
Per family, patient fell twice today, having some altered mental status, incontinence, at not acting her normal.

## 2015-03-17 DIAGNOSIS — N179 Acute kidney failure, unspecified: Secondary | ICD-10-CM | POA: Diagnosis not present

## 2015-03-17 DIAGNOSIS — R569 Unspecified convulsions: Secondary | ICD-10-CM | POA: Diagnosis not present

## 2015-03-17 DIAGNOSIS — G9340 Encephalopathy, unspecified: Secondary | ICD-10-CM | POA: Diagnosis not present

## 2015-03-17 DIAGNOSIS — R339 Retention of urine, unspecified: Secondary | ICD-10-CM | POA: Diagnosis not present

## 2015-03-17 LAB — GLUCOSE, CAPILLARY
GLUCOSE-CAPILLARY: 100 mg/dL — AB (ref 65–99)
GLUCOSE-CAPILLARY: 88 mg/dL (ref 65–99)
GLUCOSE-CAPILLARY: 90 mg/dL (ref 65–99)
GLUCOSE-CAPILLARY: 95 mg/dL (ref 65–99)
Glucose-Capillary: 110 mg/dL — ABNORMAL HIGH (ref 65–99)
Glucose-Capillary: 96 mg/dL (ref 65–99)
Glucose-Capillary: 131 mg/dL — ABNORMAL HIGH (ref 65–99)

## 2015-03-17 LAB — BASIC METABOLIC PANEL
ANION GAP: 6 (ref 5–15)
BUN: 23 mg/dL — ABNORMAL HIGH (ref 6–20)
CALCIUM: 8.5 mg/dL — AB (ref 8.9–10.3)
CO2: 27 mmol/L (ref 22–32)
CREATININE: 1.38 mg/dL — AB (ref 0.44–1.00)
Chloride: 108 mmol/L (ref 101–111)
GFR, EST AFRICAN AMERICAN: 38 mL/min — AB (ref >60)
GFR, EST NON AFRICAN AMERICAN: 33 mL/min — AB (ref >60)
Glucose, Bld: 92 mg/dL (ref 65–99)
Potassium: 3.9 mmol/L (ref 3.5–5.1)
Sodium: 141 mmol/L (ref 135–145)

## 2015-03-17 LAB — CBC
HCT: 32.2 % — ABNORMAL LOW (ref 36.0–46.0)
HEMOGLOBIN: 10.6 g/dL — AB (ref 12.0–15.0)
MCH: 28.5 pg (ref 26.0–34.0)
MCHC: 32.9 g/dL (ref 30.0–36.0)
MCV: 86.6 fL (ref 78.0–100.0)
PLATELETS: 193 10*3/uL (ref 150–400)
RBC: 3.72 MIL/uL — AB (ref 3.87–5.11)
RDW: 14 % (ref 11.5–15.5)
WBC: 7.4 10*3/uL (ref 4.0–10.5)

## 2015-03-17 LAB — AMMONIA

## 2015-03-17 LAB — TSH

## 2015-03-17 MED ORDER — ONDANSETRON HCL 4 MG/2ML IJ SOLN: 4.0000 mg | Freq: Four times a day (QID) | INTRAMUSCULAR | Status: DC | PRN

## 2015-03-17 MED ORDER — CLOPIDOGREL BISULFATE 75 MG PO TABS
75.0000 mg | ORAL_TABLET | ORAL | Status: DC
  Administered 2015-03-17 – 2015-03-20 (×4): 75 mg via ORAL
  Filled 2015-03-17 (×4): qty 1

## 2015-03-17 MED ORDER — ENOXAPARIN SODIUM 30 MG/0.3ML ~~LOC~~ SOLN
30.0000 mg | SUBCUTANEOUS | Status: DC
  Administered 2015-03-17 – 2015-03-18 (×2): 30 mg via SUBCUTANEOUS
  Filled 2015-03-17 (×2): qty 0.3

## 2015-03-17 MED ORDER — OXYCODONE HCL 5 MG PO TABS: 5.0000 mg | ORAL_TABLET | ORAL | Status: DC | PRN

## 2015-03-17 MED ORDER — ACETAMINOPHEN 325 MG PO TABS: 650.0000 mg | ORAL_TABLET | Freq: Four times a day (QID) | ORAL | Status: DC | PRN

## 2015-03-17 MED ORDER — METOPROLOL TARTRATE 25 MG PO TABS
25.0000 mg | ORAL_TABLET | Freq: Two times a day (BID) | ORAL | Status: DC
  Administered 2015-03-17 – 2015-03-20 (×7): 25 mg via ORAL
  Filled 2015-03-17 (×7): qty 1

## 2015-03-17 MED ORDER — ONDANSETRON HCL 4 MG/2ML IJ SOLN: 4.0000 mg | Freq: Three times a day (TID) | INTRAMUSCULAR | Status: AC | PRN

## 2015-03-17 MED ORDER — INSULIN ASPART 100 UNIT/ML ~~LOC~~ SOLN
0.0000 [IU] | SUBCUTANEOUS | Status: DC
  Administered 2015-03-17: 1 [IU] via SUBCUTANEOUS

## 2015-03-17 MED ORDER — ATORVASTATIN CALCIUM 20 MG PO TABS
20.0000 mg | ORAL_TABLET | ORAL | Status: DC
  Administered 2015-03-17 – 2015-03-20 (×4): 20 mg via ORAL
  Filled 2015-03-17 (×4): qty 1

## 2015-03-17 MED ORDER — AMLODIPINE BESYLATE 5 MG PO TABS
10.0000 mg | ORAL_TABLET | ORAL | Status: DC
  Administered 2015-03-17 – 2015-03-20 (×4): 10 mg via ORAL
  Filled 2015-03-17 (×4): qty 2

## 2015-03-17 MED ORDER — PANTOPRAZOLE SODIUM 40 MG PO TBEC
40.0000 mg | DELAYED_RELEASE_TABLET | Freq: Every day | ORAL | Status: DC
  Administered 2015-03-17 – 2015-03-20 (×4): 40 mg via ORAL
  Filled 2015-03-17 (×4): qty 1

## 2015-03-17 MED ORDER — SODIUM CHLORIDE 0.9 % IJ SOLN
3.0000 mL | Freq: Two times a day (BID) | INTRAMUSCULAR | Status: DC
  Administered 2015-03-18 – 2015-03-20 (×2): 3 mL via INTRAVENOUS

## 2015-03-17 MED ORDER — HYDROMORPHONE HCL 1 MG/ML IJ SOLN: 0.5000 mg | INTRAMUSCULAR | Status: DC | PRN

## 2015-03-17 MED ORDER — VITAMIN C 500 MG PO TABS
500.0000 mg | ORAL_TABLET | ORAL | Status: DC
  Administered 2015-03-17 – 2015-03-20 (×4): 500 mg via ORAL
  Filled 2015-03-17 (×4): qty 1

## 2015-03-17 MED ORDER — CALCIUM CARBONATE ANTACID 500 MG PO CHEW
1.0000 | CHEWABLE_TABLET | Freq: Two times a day (BID) | ORAL | Status: DC
  Administered 2015-03-17 (×2): 200 mg via ORAL
  Administered 2015-03-18: 400 mg via ORAL
  Administered 2015-03-18 (×2): 200 mg via ORAL
  Administered 2015-03-19: 400 mg via ORAL
  Administered 2015-03-19 – 2015-03-20 (×2): 200 mg via ORAL
  Filled 2015-03-17 (×3): qty 1
  Filled 2015-03-17: qty 2
  Filled 2015-03-17: qty 1
  Filled 2015-03-17: qty 2
  Filled 2015-03-17 (×2): qty 1

## 2015-03-17 MED ORDER — SODIUM CHLORIDE 0.9 % IV SOLN
INTRAVENOUS | Status: AC
  Administered 2015-03-17: 01:00:00 via INTRAVENOUS

## 2015-03-17 MED ORDER — ACETAMINOPHEN 650 MG RE SUPP: 650.0000 mg | Freq: Four times a day (QID) | RECTAL | Status: DC | PRN

## 2015-03-17 MED ORDER — ALUM & MAG HYDROXIDE-SIMETH 200-200-20 MG/5ML PO SUSP: 30.0000 mL | Freq: Four times a day (QID) | ORAL | Status: DC | PRN

## 2015-03-17 MED ORDER — ONDANSETRON HCL 4 MG PO TABS: 4.0000 mg | ORAL_TABLET | Freq: Four times a day (QID) | ORAL | Status: DC | PRN

## 2015-03-17 NOTE — Progress Notes (Signed)
Subjective: Patient was admitted due to change in mental status and confusion. Patient also had urine retention. She feels bnetter today. No fver or chills.  Objective: Vital signs in last 24 hours: Temp:  [97.6 F (36.4 C)-98.3 F (36.8 C)] 98 F (36.7 C) (12/26 0400) Pulse Rate:  [46-53] 46 (12/26 0400) Resp:  [14-20] 16 (12/26 0400) BP: (114-147)/(44-52) 114/44 mmHg (12/26 0400) SpO2:  [96 %-100 %] 99 % (12/26 0400) FiO2 (%):  [21 %] 21 % (12/26 0047) Weight:  [50.7 kg (111 lb 12.4 oz)-58.968 kg (130 lb)] 50.7 kg (111 lb 12.4 oz) (12/26 0035) Weight change:  Last BM Date: 03/16/15  Intake/Output from previous day: 12/25 0701 - 12/26 0700 In: 240 [P.O.:240] Out: 450 [Urine:450]  PHYSICAL EXAM General appearance: no distress and slowed mentation Resp: clear to auscultation bilaterally Cardio: S1, S2 normal GI: soft, non-tender; bowel sounds normal; no masses,  no organomegaly Extremities: extremities normal, atraumatic, no cyanosis or edema  Lab Results:  Results for orders placed or performed during the hospital encounter of 03/16/15 (from the past 48 hour(s))  CBC with Differential/Platelet     Status: Abnormal   Collection Time: 03/16/15  9:34 PM  Result Value Ref Range   WBC 8.5 4.0 - 10.5 K/uL   RBC 4.08 3.87 - 5.11 MIL/uL   Hemoglobin 11.7 (L) 12.0 - 15.0 g/dL   HCT 35.4 (L) 36.0 - 46.0 %   MCV 86.8 78.0 - 100.0 fL   MCH 28.7 26.0 - 34.0 pg   MCHC 33.1 30.0 - 36.0 g/dL   RDW 14.1 11.5 - 15.5 %   Platelets 218 150 - 400 K/uL   Neutrophils Relative % 67 %   Neutro Abs 5.8 1.7 - 7.7 K/uL   Lymphocytes Relative 19 %   Lymphs Abs 1.6 0.7 - 4.0 K/uL   Monocytes Relative 12 %   Monocytes Absolute 1.0 0.1 - 1.0 K/uL   Eosinophils Relative 2 %   Eosinophils Absolute 0.2 0.0 - 0.7 K/uL   Basophils Relative 0 %   Basophils Absolute 0.0 0.0 - 0.1 K/uL  Comprehensive metabolic panel     Status: Abnormal   Collection Time: 03/16/15  9:34 PM  Result Value Ref Range   Sodium 140 135 - 145 mmol/L   Potassium 4.5 3.5 - 5.1 mmol/L   Chloride 106 101 - 111 mmol/L   CO2 27 22 - 32 mmol/L   Glucose, Bld 134 (H) 65 - 99 mg/dL   BUN 28 (H) 6 - 20 mg/dL   Creatinine, Ser 1.93 (H) 0.44 - 1.00 mg/dL   Calcium 8.8 (L) 8.9 - 10.3 mg/dL   Total Protein 6.3 (L) 6.5 - 8.1 g/dL   Albumin 3.9 3.5 - 5.0 g/dL   AST 74 (H) 15 - 41 U/L   ALT 73 (H) 14 - 54 U/L   Alkaline Phosphatase 101 38 - 126 U/L   Total Bilirubin 0.3 0.3 - 1.2 mg/dL   GFR calc non Af Amer 22 (L) >60 mL/min   GFR calc Af Amer 26 (L) >60 mL/min    Comment: (NOTE) The eGFR has been calculated using the CKD EPI equation. This calculation has not been validated in all clinical situations. eGFR's persistently <60 mL/min signify possible Chronic Kidney Disease.    Anion gap 7 5 - 15  Troponin I     Status: None   Collection Time: 03/16/15  9:34 PM  Result Value Ref Range   Troponin I <0.03 <0.031 ng/mL  Comment:        NO INDICATION OF MYOCARDIAL INJURY.   Urinalysis, Routine w reflex microscopic (not at [facility])     Status: Abnormal   Collection Time: 03/16/15  9:50 PM  Result Value Ref Range   Color, Urine YELLOW YELLOW   APPearance CLEAR CLEAR   Specific Gravity, Urine <1.005 (L) 1.005 - 1.030   pH 6.5 5.0 - 8.0   Glucose, UA NEGATIVE NEGATIVE mg/dL   Hgb urine dipstick TRACE (A) NEGATIVE   Bilirubin Urine NEGATIVE NEGATIVE   Ketones, ur NEGATIVE NEGATIVE mg/dL   Protein, ur NEGATIVE NEGATIVE mg/dL   Nitrite NEGATIVE NEGATIVE   Leukocytes, UA SMALL (A) NEGATIVE  Urine microscopic-add on     Status: Abnormal   Collection Time: 03/16/15  9:50 PM  Result Value Ref Range   Squamous Epithelial / LPF 0-5 (A) NONE SEEN   WBC, UA 0-5 0 - 5 WBC/hpf   RBC / HPF 0-5 0 - 5 RBC/hpf   Bacteria, UA RARE (A) NONE SEEN  Glucose, capillary     Status: Abnormal   Collection Time: 03/17/15  1:01 AM  Result Value Ref Range   Glucose-Capillary 110 (H) 65 - 99 mg/dL  Ammonia     Status: Abnormal    Collection Time: 03/17/15  1:13 AM  Result Value Ref Range   Ammonia <9 (L) 9 - 35 umol/L  TSH     Status: Abnormal   Collection Time: 03/17/15  1:13 AM  Result Value Ref Range   TSH <0.010 (L) 0.350 - 4.500 uIU/mL  Glucose, capillary     Status: None   Collection Time: 03/17/15  4:21 AM  Result Value Ref Range   Glucose-Capillary 95 65 - 99 mg/dL  Basic metabolic panel     Status: Abnormal   Collection Time: 03/17/15  5:46 AM  Result Value Ref Range   Sodium 141 135 - 145 mmol/L   Potassium 3.9 3.5 - 5.1 mmol/L   Chloride 108 101 - 111 mmol/L   CO2 27 22 - 32 mmol/L   Glucose, Bld 92 65 - 99 mg/dL   BUN 23 (H) 6 - 20 mg/dL   Creatinine, Ser 1.38 (H) 0.44 - 1.00 mg/dL   Calcium 8.5 (L) 8.9 - 10.3 mg/dL   GFR calc non Af Amer 33 (L) >60 mL/min   GFR calc Af Amer 38 (L) >60 mL/min    Comment: (NOTE) The eGFR has been calculated using the CKD EPI equation. This calculation has not been validated in all clinical situations. eGFR's persistently <60 mL/min signify possible Chronic Kidney Disease.    Anion gap 6 5 - 15  CBC     Status: Abnormal   Collection Time: 03/17/15  5:46 AM  Result Value Ref Range   WBC 7.4 4.0 - 10.5 K/uL   RBC 3.72 (L) 3.87 - 5.11 MIL/uL   Hemoglobin 10.6 (L) 12.0 - 15.0 g/dL   HCT 32.2 (L) 36.0 - 46.0 %   MCV 86.6 78.0 - 100.0 fL   MCH 28.5 26.0 - 34.0 pg   MCHC 32.9 30.0 - 36.0 g/dL   RDW 14.0 11.5 - 15.5 %   Platelets 193 150 - 400 K/uL    ABGS No results for input(s): PHART, PO2ART, TCO2, HCO3 in the last 72 hours.  Invalid input(s): PCO2 CULTURES No results found for this or any previous visit (from the past 240 hour(s)). Studies/Results: Ct Head Wo Contrast  03/16/2015  CLINICAL DATA:  Two fell  today.  Altered mental status EXAM: CT HEAD WITHOUT CONTRAST TECHNIQUE: Contiguous axial images were obtained from the base of the skull through the vertex without intravenous contrast. COMPARISON:  Brain MRI 08/16/2007 FINDINGS: There is gyriform  high density cortical parenchyma in the posterior parietal temporal lobe (Image 16-22 of series 20 which is felt to relate relate to a prior hemorrhagic infarction described on MRI of 08/16/2007. No evidence of acute intracranial hemorrhage. No extra-axial fluid collections. Visualized cortical atrophy. Extensive periventricular subcortical white matter hypodensities. No evidence skull fracture.  Basilar cisterns are patent. None sinuses are clear. IMPRESSION: 1. High-density gyriform cortex in the posterior medial parietal lobe is felt to relate to remote hemorrhagic infarction and is not favored to represent acute hemorrhage. 2. Cortical atrophy and extensive white matter microvascular disease similar prior. 3. No acute intracranial findings. Electronically Signed   By: Suzy Bouchard M.D.   On: 03/16/2015 21:30    Medications: I have reviewed the patient's current medications.  Assesment:   Principal Problem:   Acute encephalopathy Active Problems:   DM type 2 (diabetes mellitus, type 2) ([place])   Urinary retention   AKI (acute kidney injury) ([place])   Coronary artery disease   Essential hypertension   Sinus bradycardia    Plan:  Medications reviewed Will continue telemetry Neurology consult Will monitor CBC/BMP Will rehydrate      Kathline Banbury 03/17/2015, 9:15 AM

## 2015-03-17 NOTE — Consult Note (Addendum)
[place] A. Merlene Laughter, MD     [email or web address]          Caroline Aguirre is an 81 y.o. female.   ASSESSMENT/PLAN: Acute encephalopathy etiology is mostly from acute renal failure. No other metabolic derangements or infectious processes have been uncovered. There is no clear evidence of focal deficits to suggest a stroke.  RECOMMENDATION: Brain MRI. EEG. Additional labs for thyroid function tests and vitamin B-12 level Avoid psychotropic medications.  The patient is an 81 year old white female who terribly apparently is lucid and coherent. About a day or 2 ago the patient started having nonsensical speech apparently talking out of her head. No reports of seizures, headaches, focal neurological deficit or injuries. She is unable to provide a history because of the confusion.   GENERAL: She is in no acute distress.  HEENT: Supple. Atraumatic normocephalic.   ABDOMEN: soft  EXTREMITIES: No edema   BACK: Normal.  SKIN: Normal by inspection.    MENTAL STATUS: The patient is laying in bed with eyes closed. She does opens her eyes spontaneously. She does follow commands with prompting. She seemed to have difficulty following commands however requiring repeated questioning and difficulty with comprehension ??? aphasia. Speech is incomprehensible and nonsensical.   CRANIAL NERVES: Pupils are equal, round and reactive to light; extra ocular movements are full, there is no significant nystagmus; visual fields are full; upper and lower facial muscles are normal in strength and symmetric, there is no flattening of the nasolabial folds; tongue is midline; uvula is midline; shoulder elevation is normal.  MOTOR: She has at least antigravity strength in all 4 extremities but the exact grading is difficult because of difficulty with the patient comprehend and commands. There appears to be normal bulk and tone throughout. COORDINATION: Left finger to nose is normal, right  finger to nose is normal. No tremors or dysmetrias appreciated.  REFLEXES: Deep tendon reflexes are symmetrical and normal. Babinski reflexes are flexor bilaterally.   SENSATION: Normal to light touch.      Blood pressure 121/43, pulse 58, temperature 98.9 F (37.2 C), temperature source Oral, resp. rate 20, height '5\' 3"'$  (1.6 m), weight 50.7 kg (111 lb 12.4 oz), SpO2 94 %.  Past Medical History  Diagnosis Date  . Herniated disc   . Cyst of breast     Right - at 37 yrs of age.  . Shingles   . Hardening of the arteries of the brain   . Stroke [facility]) 2004    cerebral hemmorhage  . Diverticulitis   . Arthritis   . 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 Hx Breast cancer, DCIS, Right 08/19/2010  . Migraine headache   . SBO (small bowel obstruction) ([place]) 09/02/11  . Elevated cholesterol   . Heart disease   . GERD (gastroesophageal reflux disease)   . Coronary artery disease   . Hypertension   . Cancer ([place])     Rt Breast  . Diabetes mellitus without complication [facility])     Past Surgical History  Procedure Laterality Date  . Brain surgery      hemorrhage  . Cystectomy    . Spine surgery      repair herniated disc  . Fibroid tumors    . Breast surgery  09/25/2010    mastectomy  . Knee arthroscopy Right     right knee x 3  . Laparoscopic bilateral salpingo oopherectomy  Family History  Problem Relation Age of Onset  . Cancer Mother   . Heart disease Father   . Spinal muscular atrophy Sister     spinal meningitis    Social History:  reports that she has never smoked. She has never used smokeless tobacco. She reports that she does not drink alcohol or use illicit drugs.  Allergies:  Allergies  Allergen Reactions  . Tape Rash  . Ciprofloxacin   . Sulfa Antibiotics Hives  . Codeine Nausea Only    nausea.  . Morphine And Related Nausea Only    Nausea only.    Medications: Prior to Admission medications   Medication  Sig Start Date End Date Taking? Authorizing Provider  amLODipine (NORVASC) 10 MG tablet Take 10 mg by mouth every morning.    Yes Historical Provider, MD  atorvastatin (LIPITOR) 20 MG tablet Take 20 mg by mouth every morning.    Yes Historical Provider, MD  calcium carbonate (TUMS EX) 750 MG chewable tablet Chew 1 tablet by mouth 2 (two) times daily.    Yes Historical Provider, MD  clopidogrel (PLAVIX) 75 MG tablet Take 75 mg by mouth every morning.    Yes Historical Provider, MD  losartan-hydrochlorothiazide (HYZAAR) 100-12.5 MG per tablet Take 1 tablet by mouth every morning.    Yes Historical Provider, MD  Methylcellulose, Laxative, (CITRUCEL) 500 MG TABS Take 1 tablet by mouth daily.    Yes Historical Provider, MD  metoprolol (LOPRESSOR) 50 MG tablet Take 50 mg by mouth 2 (two) times daily.    Yes Historical Provider, MD  Multiple Vitamin (MULTIVITAMIN WITH MINERALS) TABS Take 1 tablet by mouth every morning. *Contains NO Iron (Ferrous Sulfate and/or Iodine)*   Yes Historical Provider, MD  omeprazole (PRILOSEC) 20 MG capsule Take 20 mg by mouth every morning.    Yes Historical Provider, MD  trimethoprim (TRIMPEX) 100 MG tablet Take 100 mg by mouth at bedtime.   Yes Historical Provider, MD  vitamin C (ASCORBIC ACID) 500 MG tablet Take 500 mg by mouth every morning.    Yes Historical Provider, MD  cephALEXin (KEFLEX) 500 MG capsule Take 1 capsule (500 mg total) by mouth 4 (four) times daily. Patient not taking: Reported on 03/17/2015 11/16/14   Evalee Jefferson, PA-C    Scheduled Meds: . amLODipine  10 mg Oral BH-q7a  . atorvastatin  20 mg Oral BH-q7a  . calcium carbonate  1-2 tablet Oral BID  . clopidogrel  75 mg Oral BH-q7a  . enoxaparin (LOVENOX) injection  30 mg Subcutaneous Q24H  . insulin aspart  0-9 Units Subcutaneous 6 times per day  . metoprolol  25 mg Oral BID  . pantoprazole  40 mg Oral Daily  . sodium chloride  3 mL Intravenous Q12H  . vitamin C  500 mg Oral BH-q7a   Continuous  Infusions:  PRN Meds:.acetaminophen **OR** acetaminophen, alum & mag hydroxide-simeth, HYDROmorphone (DILAUDID) injection, ondansetron **OR** ondansetron (ZOFRAN) IV, oxyCODONE     Results for orders placed or performed during the hospital encounter of 03/16/15 (from the past 48 hour(s))  CBC with Differential/Platelet     Status: Abnormal   Collection Time: 03/16/15  9:34 PM  Result Value Ref Range   WBC 8.5 4.0 - 10.5 K/uL   RBC 4.08 3.87 - 5.11 MIL/uL   Hemoglobin 11.7 (L) 12.0 - 15.0 g/dL   HCT 35.4 (L) 36.0 - 46.0 %   MCV 86.8 78.0 - 100.0 fL   MCH 28.7 26.0 - 34.0 pg  MCHC 33.1 30.0 - 36.0 g/dL   RDW 72.5 91.2 - 64.4 %   Platelets 218 150 - 400 K/uL   Neutrophils Relative % 67 %   Neutro Abs 5.8 1.7 - 7.7 K/uL   Lymphocytes Relative 19 %   Lymphs Abs 1.6 0.7 - 4.0 K/uL   Monocytes Relative 12 %   Monocytes Absolute 1.0 0.1 - 1.0 K/uL   Eosinophils Relative 2 %   Eosinophils Absolute 0.2 0.0 - 0.7 K/uL   Basophils Relative 0 %   Basophils Absolute 0.0 0.0 - 0.1 K/uL  Comprehensive metabolic panel     Status: Abnormal   Collection Time: 03/16/15  9:34 PM  Result Value Ref Range   Sodium 140 135 - 145 mmol/L   Potassium 4.5 3.5 - 5.1 mmol/L   Chloride 106 101 - 111 mmol/L   CO2 27 22 - 32 mmol/L   Glucose, Bld 134 (H) 65 - 99 mg/dL   BUN 28 (H) 6 - 20 mg/dL   Creatinine, Ser 5.52 (H) 0.44 - 1.00 mg/dL   Calcium 8.8 (L) 8.9 - 10.3 mg/dL   Total Protein 6.3 (L) 6.5 - 8.1 g/dL   Albumin 3.9 3.5 - 5.0 g/dL   AST 74 (H) 15 - 41 U/L   ALT 73 (H) 14 - 54 U/L   Alkaline Phosphatase 101 38 - 126 U/L   Total Bilirubin 0.3 0.3 - 1.2 mg/dL   GFR calc non Af Amer 22 (L) >60 mL/min   GFR calc Af Amer 26 (L) >60 mL/min    Comment: (NOTE) The eGFR has been calculated using the CKD EPI equation. This calculation has not been validated in all clinical situations. eGFR's persistently <60 mL/min signify possible Chronic Kidney Disease.    Anion gap 7 5 - 15  Troponin I      Status: None   Collection Time: 03/16/15  9:34 PM  Result Value Ref Range   Troponin I <0.03 <0.031 ng/mL    Comment:        NO INDICATION OF MYOCARDIAL INJURY.   Urinalysis, Routine w reflex microscopic (not at [facility])     Status: Abnormal   Collection Time: 03/16/15  9:50 PM  Result Value Ref Range   Color, Urine YELLOW YELLOW   APPearance CLEAR CLEAR   Specific Gravity, Urine <1.005 (L) 1.005 - 1.030   pH 6.5 5.0 - 8.0   Glucose, UA NEGATIVE NEGATIVE mg/dL   Hgb urine dipstick TRACE (A) NEGATIVE   Bilirubin Urine NEGATIVE NEGATIVE   Ketones, ur NEGATIVE NEGATIVE mg/dL   Protein, ur NEGATIVE NEGATIVE mg/dL   Nitrite NEGATIVE NEGATIVE   Leukocytes, UA SMALL (A) NEGATIVE  Urine microscopic-add on     Status: Abnormal   Collection Time: 03/16/15  9:50 PM  Result Value Ref Range   Squamous Epithelial / LPF 0-5 (A) NONE SEEN   WBC, UA 0-5 0 - 5 WBC/hpf   RBC / HPF 0-5 0 - 5 RBC/hpf   Bacteria, UA RARE (A) NONE SEEN  Glucose, capillary     Status: Abnormal   Collection Time: 03/17/15  1:01 AM  Result Value Ref Range   Glucose-Capillary 110 (H) 65 - 99 mg/dL  Ammonia     Status: Abnormal   Collection Time: 03/17/15  1:13 AM  Result Value Ref Range   Ammonia <9 (L) 9 - 35 umol/L  TSH     Status: Abnormal   Collection Time: 03/17/15  1:13 AM  Result Value Ref  Range   TSH <0.010 (L) 0.350 - 4.500 uIU/mL  Glucose, capillary     Status: None   Collection Time: 03/17/15  4:21 AM  Result Value Ref Range   Glucose-Capillary 95 65 - 99 mg/dL  Basic metabolic panel     Status: Abnormal   Collection Time: 03/17/15  5:46 AM  Result Value Ref Range   Sodium 141 135 - 145 mmol/L   Potassium 3.9 3.5 - 5.1 mmol/L   Chloride 108 101 - 111 mmol/L   CO2 27 22 - 32 mmol/L   Glucose, Bld 92 65 - 99 mg/dL   BUN 23 (H) 6 - 20 mg/dL   Creatinine, Ser 1.38 (H) 0.44 - 1.00 mg/dL   Calcium 8.5 (L) 8.9 - 10.3 mg/dL   GFR calc non Af Amer 33 (L) >60 mL/min   GFR calc Af Amer 38 (L) >60 mL/min     Comment: (NOTE) The eGFR has been calculated using the CKD EPI equation. This calculation has not been validated in all clinical situations. eGFR's persistently <60 mL/min signify possible Chronic Kidney Disease.    Anion gap 6 5 - 15  CBC     Status: Abnormal   Collection Time: 03/17/15  5:46 AM  Result Value Ref Range   WBC 7.4 4.0 - 10.5 K/uL   RBC 3.72 (L) 3.87 - 5.11 MIL/uL   Hemoglobin 10.6 (L) 12.0 - 15.0 g/dL   HCT 32.2 (L) 36.0 - 46.0 %   MCV 86.6 78.0 - 100.0 fL   MCH 28.5 26.0 - 34.0 pg   MCHC 32.9 30.0 - 36.0 g/dL   RDW 14.0 11.5 - 15.5 %   Platelets 193 150 - 400 K/uL  Glucose, capillary     Status: None   Collection Time: 03/17/15  7:51 AM  Result Value Ref Range   Glucose-Capillary 96 65 - 99 mg/dL  Glucose, capillary     Status: Abnormal   Collection Time: 03/17/15 11:24 AM  Result Value Ref Range   Glucose-Capillary 100 (H) 65 - 99 mg/dL  Glucose, capillary     Status: Abnormal   Collection Time: 03/17/15  4:48 PM  Result Value Ref Range   Glucose-Capillary 131 (H) 65 - 99 mg/dL   Comment 1 Notify RN   Glucose, capillary     Status: None   Collection Time: 03/17/15  7:56 PM  Result Value Ref Range   Glucose-Capillary 90 65 - 99 mg/dL   Comment 1 Notify RN    Comment 2 Document in Chart     Studies/Results:     Medina Degraffenreid A. Merlene Laughter, M.D.  Diplomate, Tax adviser of Psychiatry and Neurology ( Neurology). 03/17/2015, 10:03 PM

## 2015-03-18 ENCOUNTER — Observation Stay (HOSPITAL_COMMUNITY): Payer: Medicare Other

## 2015-03-18 ENCOUNTER — Observation Stay (HOSPITAL_COMMUNITY)
Admit: 2015-03-18 | Discharge: 2015-03-18 | Disposition: A | Discharge status: Home / Self Care | Payer: Medicare Other | Attending: Neurology | Admitting: Neurology

## 2015-03-18 DIAGNOSIS — I639 Cerebral infarction, unspecified: Secondary | ICD-10-CM | POA: Diagnosis not present

## 2015-03-18 DIAGNOSIS — R569 Unspecified convulsions: Secondary | ICD-10-CM | POA: Diagnosis not present

## 2015-03-18 DIAGNOSIS — N179 Acute kidney failure, unspecified: Secondary | ICD-10-CM | POA: Diagnosis not present

## 2015-03-18 DIAGNOSIS — G9340 Encephalopathy, unspecified: Secondary | ICD-10-CM | POA: Diagnosis not present

## 2015-03-18 DIAGNOSIS — R339 Retention of urine, unspecified: Secondary | ICD-10-CM | POA: Diagnosis not present

## 2015-03-18 LAB — GLUCOSE, CAPILLARY
GLUCOSE-CAPILLARY: 109 mg/dL — AB (ref 65–99)
GLUCOSE-CAPILLARY: 144 mg/dL — AB (ref 65–99)
GLUCOSE-CAPILLARY: 102 mg/dL — AB (ref 65–99)
Glucose-Capillary: 98 mg/dL (ref 65–99)
Glucose-Capillary: 119 mg/dL — ABNORMAL HIGH (ref 65–99)
Glucose-Capillary: 110 mg/dL — ABNORMAL HIGH (ref 65–99)

## 2015-03-18 LAB — CBC
HCT: 34.5 % — ABNORMAL LOW (ref 36.0–46.0)
Hemoglobin: 11.6 g/dL — ABNORMAL LOW (ref 12.0–15.0)
MCH: 28.7 pg (ref 26.0–34.0)
MCHC: 33.6 g/dL (ref 30.0–36.0)
MCV: 85.4 fL (ref 78.0–100.0)
PLATELETS: 209 10*3/uL (ref 150–400)
RBC: 4.04 MIL/uL (ref 3.87–5.11)
RDW: 13.9 % (ref 11.5–15.5)
WBC: 9.6 10*3/uL (ref 4.0–10.5)

## 2015-03-18 LAB — BASIC METABOLIC PANEL
Anion gap: 7 (ref 5–15)
BUN: 18 mg/dL (ref 6–20)
CALCIUM: 9 mg/dL (ref 8.9–10.3)
CHLORIDE: 109 mmol/L (ref 101–111)
CO2: 26 mmol/L (ref 22–32)
CREATININE: 0.94 mg/dL (ref 0.44–1.00)
GFR calc Af Amer: >60 mL/min (ref >60)
GFR, EST NON AFRICAN AMERICAN: 53 mL/min — AB (ref >60)
GLUCOSE: 104 mg/dL — AB (ref 65–99)
POTASSIUM: 3.8 mmol/L (ref 3.5–5.1)
SODIUM: 142 mmol/L (ref 135–145)

## 2015-03-18 LAB — HEMOGLOBIN A1C
Hgb A1c MFr Bld: 6.4 % — ABNORMAL HIGH (ref 4.8–5.6)
MEAN PLASMA GLUCOSE: 137 mg/dL

## 2015-03-18 LAB — VITAMIN B12: VITAMIN B 12: 354 pg/mL (ref 180–914)

## 2015-03-18 LAB — URINE CULTURE

## 2015-03-18 MED ORDER — ENOXAPARIN SODIUM 40 MG/0.4ML ~~LOC~~ SOLN
40.0000 mg | SUBCUTANEOUS | Status: DC
  Administered 2015-03-19: 40 mg via SUBCUTANEOUS
  Filled 2015-03-18 (×2): qty 0.4

## 2015-03-18 MED ORDER — ASPIRIN 81 MG PO CHEW
81.0000 mg | CHEWABLE_TABLET | Freq: Every day | ORAL | Status: DC
  Administered 2015-03-18 – 2015-03-20 (×3): 81 mg via ORAL
  Filled 2015-03-18 (×3): qty 1

## 2015-03-18 NOTE — Progress Notes (Signed)
EEG Completed; Results Pending  

## 2015-03-18 NOTE — Procedures (Signed)
  [place] A. Merlene Laughter, MD     [email or web address]           HISTORY: The patient is an 81 year old lady who presents with confusion suggestive for seizures.   MEDICATIONS: Scheduled Meds: . amLODipine  10 mg Oral BH-q7a  . aspirin  81 mg Oral Daily  . atorvastatin  20 mg Oral BH-q7a  . calcium carbonate  1-2 tablet Oral BID  . clopidogrel  75 mg Oral BH-q7a  . [START ON 03/19/2015] enoxaparin (LOVENOX) injection  40 mg Subcutaneous Q24H  . insulin aspart  0-9 Units Subcutaneous 6 times per day  . metoprolol  25 mg Oral BID  . pantoprazole  40 mg Oral Daily  . sodium chloride  3 mL Intravenous Q12H  . vitamin C  500 mg Oral BH-q7a   Continuous Infusions:  PRN Meds:.acetaminophen **OR** acetaminophen, alum & mag hydroxide-simeth, HYDROmorphone (DILAUDID) injection, ondansetron **OR** ondansetron (ZOFRAN) IV, oxyCODONE  Prior to Admission medications   Medication Sig Start Date End Date Taking? Authorizing Provider  amLODipine (NORVASC) 10 MG tablet Take 10 mg by mouth every morning.    Yes Historical Provider, MD  atorvastatin (LIPITOR) 20 MG tablet Take 20 mg by mouth every morning.    Yes Historical Provider, MD  calcium carbonate (TUMS EX) 750 MG chewable tablet Chew 1 tablet by mouth 2 (two) times daily.    Yes Historical Provider, MD  clopidogrel (PLAVIX) 75 MG tablet Take 75 mg by mouth every morning.    Yes Historical Provider, MD  losartan-hydrochlorothiazide (HYZAAR) 100-12.5 MG per tablet Take 1 tablet by mouth every morning.    Yes Historical Provider, MD  Methylcellulose, Laxative, (CITRUCEL) 500 MG TABS Take 1 tablet by mouth daily.    Yes Historical Provider, MD  metoprolol (LOPRESSOR) 50 MG tablet Take 50 mg by mouth 2 (two) times daily.    Yes Historical Provider, MD  Multiple Vitamin (MULTIVITAMIN WITH MINERALS) TABS Take 1 tablet by mouth every morning. *Contains NO Iron (Ferrous Sulfate and/or Iodine)*   Yes Historical Provider, MD    omeprazole (PRILOSEC) 20 MG capsule Take 20 mg by mouth every morning.    Yes Historical Provider, MD  trimethoprim (TRIMPEX) 100 MG tablet Take 100 mg by mouth at bedtime.   Yes Historical Provider, MD  vitamin C (ASCORBIC ACID) 500 MG tablet Take 500 mg by mouth every morning.    Yes Historical Provider, MD  cephALEXin (KEFLEX) 500 MG capsule Take 1 capsule (500 mg total) by mouth 4 (four) times daily. Patient not taking: Reported on 03/17/2015 11/16/14   Evalee Jefferson, PA-C      ANALYSIS: A 16 channel recording using standard 10 20 measurements is conducted for 21 minutes. There is a well-formed posterior dominant rhythm of 9.5-10 Hz which attenuates with eye opening. There is beta activity observed in the frontal areas. Awake and drowsy activities are observed. Photic stimulation and hyperventilation are not carried out. There is no focal or lateralized slowing. There are 1-2 sharp wave activity that phase reverses at the central temporal regions C3/T3.   IMPRESSION: This recording slightly abnormal showing a single sharp wave activity involving the left central temporal region. This can be correlated clinically with temporal onset epileptic seizures.      Neilan Rizzo Caroline Aguirre, M.D.  Diplomate, Tax adviser of Psychiatry and Neurology ( Neurology).

## 2015-03-18 NOTE — Care Management Obs Status (Signed)
[place] NOTIFICATION   Patient Details  Name: Caroline Aguirre MRN: [id number] Date of Birth: [birth year]   Medicare Observation Status Notification Given:  Yes    Sherald Barge, RN 03/18/2015, 8:25 AM

## 2015-03-18 NOTE — Care Management Note (Signed)
Case Management Note  Patient Details  Name: Caroline Aguirre MRN: [id number] Date of Birth: December 08, 1924  Subjective/Objective:                  Pt admitted with AMS. Pt lives with her son and is ind with ADL's at baseline. Pt's mentation is much improved at lunchtime today. Pt plans to return home with self care at DC. Pt's son is in the room and agrees with DC plan.   Action/Plan: No CM needs.   Expected Discharge Date:    03/19/2015              Expected Discharge Plan:  Home/Self Care  In-House Referral:  NA  Discharge planning Services  CM Consult  Post Acute Care Choice:  NA Choice offered to:  NA  DME Arranged:    DME Agency:     HH Arranged:    HH Agency:     Status of Service:  In process, will continue to follow  Medicare Important Message Given:    Date Medicare IM Given:    Medicare IM give by:    Date Additional Medicare IM Given:    Additional Medicare Important Message give by:     If discussed at [place] of Stay Meetings, dates discussed:    Additional Comments:  Sherald Barge, RN 03/18/2015, 12:02 PM

## 2015-03-18 NOTE — Progress Notes (Signed)
Subjective: Patient is more alert and awake. She has no fever or chills. No new complaint..  Objective: Vital signs in last 24 hours: Temp:  [98.1 F (36.7 C)-98.9 F (37.2 C)] 98.6 F (37 C) (12/27 0649) Pulse Rate:  [52-68] 64 (12/27 0649) Resp:  [16-20] 20 (12/27 0649) BP: (108-140)/(43-52) 117/48 mmHg (12/27 0649) SpO2:  [94 %-100 %] 99 % (12/27 0649) Weight change:  Last BM Date: 03/16/15  Intake/Output from previous day: 12/26 0701 - 12/27 0700 In: 480 [P.O.:480] Out: 2450 [Urine:2450]  PHYSICAL EXAM General appearance: no distress and slowed mentation Resp: clear to auscultation bilaterally Cardio: S1, S2 normal GI: soft, non-tender; bowel sounds normal; no masses,  no organomegaly Extremities: extremities normal, atraumatic, no cyanosis or edema  Lab Results:  Results for orders placed or performed during the hospital encounter of 03/16/15 (from the past 48 hour(s))  CBC with Differential/Platelet     Status: Abnormal   Collection Time: 03/16/15  9:34 PM  Result Value Ref Range   WBC 8.5 4.0 - 10.5 K/uL   RBC 4.08 3.87 - 5.11 MIL/uL   Hemoglobin 11.7 (L) 12.0 - 15.0 g/dL   HCT 35.4 (L) 36.0 - 46.0 %   MCV 86.8 78.0 - 100.0 fL   MCH 28.7 26.0 - 34.0 pg   MCHC 33.1 30.0 - 36.0 g/dL   RDW 14.1 11.5 - 15.5 %   Platelets 218 150 - 400 K/uL   Neutrophils Relative % 67 %   Neutro Abs 5.8 1.7 - 7.7 K/uL   Lymphocytes Relative 19 %   Lymphs Abs 1.6 0.7 - 4.0 K/uL   Monocytes Relative 12 %   Monocytes Absolute 1.0 0.1 - 1.0 K/uL   Eosinophils Relative 2 %   Eosinophils Absolute 0.2 0.0 - 0.7 K/uL   Basophils Relative 0 %   Basophils Absolute 0.0 0.0 - 0.1 K/uL  Comprehensive metabolic panel     Status: Abnormal   Collection Time: 03/16/15  9:34 PM  Result Value Ref Range   Sodium 140 135 - 145 mmol/L   Potassium 4.5 3.5 - 5.1 mmol/L   Chloride 106 101 - 111 mmol/L   CO2 27 22 - 32 mmol/L   Glucose, Bld 134 (H) 65 - 99 mg/dL   BUN 28 (H) 6 - 20 mg/dL   Creatinine, Ser 1.93 (H) 0.44 - 1.00 mg/dL   Calcium 8.8 (L) 8.9 - 10.3 mg/dL   Total Protein 6.3 (L) 6.5 - 8.1 g/dL   Albumin 3.9 3.5 - 5.0 g/dL   AST 74 (H) 15 - 41 U/L   ALT 73 (H) 14 - 54 U/L   Alkaline Phosphatase 101 38 - 126 U/L   Total Bilirubin 0.3 0.3 - 1.2 mg/dL   GFR calc non Af Amer 22 (L) >60 mL/min   GFR calc Af Amer 26 (L) >60 mL/min    Comment: (NOTE) The eGFR has been calculated using the CKD EPI equation. This calculation has not been validated in all clinical situations. eGFR's persistently <60 mL/min signify possible Chronic Kidney Disease.    Anion gap 7 5 - 15  Troponin I     Status: None   Collection Time: 03/16/15  9:34 PM  Result Value Ref Range   Troponin I <0.03 <0.031 ng/mL    Comment:        NO INDICATION OF MYOCARDIAL INJURY.   Urinalysis, Routine w reflex microscopic (not at [facility])     Status: Abnormal   Collection Time: 03/16/15  9:50 PM  Result Value Ref Range   Color, Urine YELLOW YELLOW   APPearance CLEAR CLEAR   Specific Gravity, Urine <1.005 (L) 1.005 - 1.030   pH 6.5 5.0 - 8.0   Glucose, UA NEGATIVE NEGATIVE mg/dL   Hgb urine dipstick TRACE (A) NEGATIVE   Bilirubin Urine NEGATIVE NEGATIVE   Ketones, ur NEGATIVE NEGATIVE mg/dL   Protein, ur NEGATIVE NEGATIVE mg/dL   Nitrite NEGATIVE NEGATIVE   Leukocytes, UA SMALL (A) NEGATIVE  Urine microscopic-add on     Status: Abnormal   Collection Time: 03/16/15  9:50 PM  Result Value Ref Range   Squamous Epithelial / LPF 0-5 (A) NONE SEEN   WBC, UA 0-5 0 - 5 WBC/hpf   RBC / HPF 0-5 0 - 5 RBC/hpf   Bacteria, UA RARE (A) NONE SEEN  Glucose, capillary     Status: Abnormal   Collection Time: 03/17/15  1:01 AM  Result Value Ref Range   Glucose-Capillary 110 (H) 65 - 99 mg/dL  Ammonia     Status: Abnormal   Collection Time: 03/17/15  1:13 AM  Result Value Ref Range   Ammonia <9 (L) 9 - 35 umol/L  TSH     Status: Abnormal   Collection Time: 03/17/15  1:13 AM  Result Value Ref Range   TSH  <0.010 (L) 0.350 - 4.500 uIU/mL  Glucose, capillary     Status: None   Collection Time: 03/17/15  4:21 AM  Result Value Ref Range   Glucose-Capillary 95 65 - 99 mg/dL  Basic metabolic panel     Status: Abnormal   Collection Time: 03/17/15  5:46 AM  Result Value Ref Range   Sodium 141 135 - 145 mmol/L   Potassium 3.9 3.5 - 5.1 mmol/L   Chloride 108 101 - 111 mmol/L   CO2 27 22 - 32 mmol/L   Glucose, Bld 92 65 - 99 mg/dL   BUN 23 (H) 6 - 20 mg/dL   Creatinine, Ser 1.38 (H) 0.44 - 1.00 mg/dL   Calcium 8.5 (L) 8.9 - 10.3 mg/dL   GFR calc non Af Amer 33 (L) >60 mL/min   GFR calc Af Amer 38 (L) >60 mL/min    Comment: (NOTE) The eGFR has been calculated using the CKD EPI equation. This calculation has not been validated in all clinical situations. eGFR's persistently <60 mL/min signify possible Chronic Kidney Disease.    Anion gap 6 5 - 15  CBC     Status: Abnormal   Collection Time: 03/17/15  5:46 AM  Result Value Ref Range   WBC 7.4 4.0 - 10.5 K/uL   RBC 3.72 (L) 3.87 - 5.11 MIL/uL   Hemoglobin 10.6 (L) 12.0 - 15.0 g/dL   HCT 32.2 (L) 36.0 - 46.0 %   MCV 86.6 78.0 - 100.0 fL   MCH 28.5 26.0 - 34.0 pg   MCHC 32.9 30.0 - 36.0 g/dL   RDW 14.0 11.5 - 15.5 %   Platelets 193 150 - 400 K/uL  Hemoglobin A1c     Status: Abnormal   Collection Time: 03/17/15  5:46 AM  Result Value Ref Range   Hgb A1c MFr Bld 6.4 (H) 4.8 - 5.6 %    Comment: (NOTE)         Pre-diabetes: 5.7 - 6.4         Diabetes: >6.4         Glycemic control for adults with diabetes: <7.0    Mean Plasma Glucose 137 mg/dL  Comment: (NOTE) Performed At: [facility] [address] [place], [place] [id number] Lindon Romp MD [id number]   Glucose, capillary     Status: None   Collection Time: 03/17/15  7:51 AM  Result Value Ref Range   Glucose-Capillary 96 65 - 99 mg/dL  Glucose, capillary     Status: Abnormal   Collection Time: 03/17/15 11:24 AM  Result Value Ref Range   Glucose-Capillary 100  (H) 65 - 99 mg/dL  Glucose, capillary     Status: Abnormal   Collection Time: 03/17/15  4:48 PM  Result Value Ref Range   Glucose-Capillary 131 (H) 65 - 99 mg/dL   Comment 1 Notify RN   Glucose, capillary     Status: None   Collection Time: 03/17/15  7:56 PM  Result Value Ref Range   Glucose-Capillary 90 65 - 99 mg/dL   Comment 1 Notify RN    Comment 2 Document in Chart   Glucose, capillary     Status: None   Collection Time: 03/17/15 11:59 PM  Result Value Ref Range   Glucose-Capillary 88 65 - 99 mg/dL   Comment 1 Notify RN    Comment 2 Document in Chart   Glucose, capillary     Status: Abnormal   Collection Time: 03/18/15  4:24 AM  Result Value Ref Range   Glucose-Capillary 109 (H) 65 - 99 mg/dL   Comment 1 Notify RN    Comment 2 Document in Chart     ABGS No results for input(s): PHART, PO2ART, TCO2, HCO3 in the last 72 hours.  Invalid input(s): PCO2 CULTURES No results found for this or any previous visit (from the past 240 hour(s)). Studies/Results: Ct Head Wo Contrast  03/16/2015  CLINICAL DATA:  Two fell today.  Altered mental status EXAM: CT HEAD WITHOUT CONTRAST TECHNIQUE: Contiguous axial images were obtained from the base of the skull through the vertex without intravenous contrast. COMPARISON:  Brain MRI 08/16/2007 FINDINGS: There is gyriform high density cortical parenchyma in the posterior parietal temporal lobe (Image [id number] of series 20 which is felt to relate relate to a prior hemorrhagic infarction described on MRI of 08/16/2007. No evidence of acute intracranial hemorrhage. No extra-axial fluid collections. Visualized cortical atrophy. Extensive periventricular subcortical white matter hypodensities. No evidence skull fracture.  Basilar cisterns are patent. None sinuses are clear. IMPRESSION: 1. High-density gyriform cortex in the posterior medial parietal lobe is felt to relate to remote hemorrhagic infarction and is not favored to represent acute hemorrhage. 2.  Cortical atrophy and extensive white matter microvascular disease similar prior. 3. No acute intracranial findings. Electronically Signed   By: Suzy Bouchard M.D.   On: 03/16/2015 21:30    Medications: I have reviewed the patient's current medications.  Assesment:   Principal Problem:   Acute encephalopathy Active Problems:   DM type 2 (diabetes mellitus, type 2) ([place])   Urinary retention   AKI (acute kidney injury) ([place])   Coronary artery disease   Essential hypertension   Sinus bradycardia    Plan:  Medications reviewed Will continue telemetry Neurology consult appreciated Will monitor CBC/BMP Will continue to rehydrate      Layne Lebon 03/18/2015, 7:35 AM

## 2015-03-18 NOTE — Progress Notes (Signed)
Patient ID: Caroline Aguirre, female   DOB: [birth year], 81 y.o.   MRN: [id number]   [facility] Beatrice Sehgal A. Gerilyn Pilgrim, MD     [email or web address]          Caroline Aguirre is an 81 y.o. female.   Assessment/Plan: Acute encephalopathy.Imaging shows acute left MCA infarct. The patient's difficulty comprehending is likely due to the aphasia from her stroke. The renal failure also is contributing to the confusion. Risk factors for the patient's stroke, age, previous infarcts, hypertension and diabetes. The infarcts suggests cardioembolic etiology.  Hyperthyroidism. Treatment deferred to primary care provider.  Asymptomatic small aneurysm. Recommend observation given the small size and the patient's age.  Slightly abnormal EEG. This will be observed for now.   RECOMMENDATION: Lipid panel. Speech, occupational and the physical therapy evaluation. 30 day Holter monitor to evaluate for atrial fibrillation as a cause of her strokes. ECHO AND CAROTID  Plavix will be switched to aspirin. Avoid psychotropic medications.     There appears to be some improvement in cognition and language today.   GENERAL: She is in no acute distress.  HEENT: Supple. Atraumatic normocephalic.   ABDOMEN: soft  EXTREMITIES: No edema   BACK: Normal.  SKIN: Normal by inspection.   MENTAL STATUS: She is awake and alert although she seems more irritated today. Naming is scared and comprehension is much improved but she is disoriented. She thinks is in a doctor's office. She does follow commands with less prompting today.  CRANIAL NERVES: Pupils are equal, round and reactive to light; extra ocular movements are full, there is no significant nystagmus; visual fields are full; upper and lower facial muscles are normal in strength and symmetric, there is no flattening of the nasolabial folds; tongue is midline; uvula is midline; shoulder elevation is normal.  MOTOR: She has at least antigravity  strength in all 4 extremities but the exact grading is difficult because of difficulty with the patient comprehend and commands. There appears to be normal bulk and tone throughout. COORDINATION: Left finger to nose is normal, right finger to nose is normal. No tremors or dysmetrias appreciated.  REFLEXES: Deep tendon reflexes are symmetrical and normal. Babinski reflexes are flexor bilaterally.   SENSATION: Normal to light touch.     Objective: Vital signs in last 24 hours: Temp:  [98.1 F (36.7 C)-98.9 F (37.2 C)] 98.1 F (36.7 C) (12/27 1436) Pulse Rate:  [58-70] 66 (12/27 1436) Resp:  [20] 20 (12/27 1436) BP: (102-140)/(43-52) 102/48 mmHg (12/27 1436) SpO2:  [94 %-100 %] 100 % (12/27 1436)  Intake/Output from previous day: 12/26 0701 - 12/27 0700 In: 480 [P.O.:480] Out: 2450 [Urine:2450] Intake/Output this shift: Total I/O In: 240 [P.O.:240] Out: 300 [Urine:300] Nutritional status: Diet Heart Room service appropriate?: Yes; Fluid consistency:: Thin   Lab Results: Results for orders placed or performed during the hospital encounter of 03/16/15 (from the past 48 hour(s))  CBC with Differential/Platelet     Status: Abnormal   Collection Time: 03/16/15  9:34 PM  Result Value Ref Range   WBC 8.5 4.0 - 10.5 K/uL   RBC 4.08 3.87 - 5.11 MIL/uL   Hemoglobin 11.7 (L) 12.0 - 15.0 g/dL   HCT 17.2 (L) 08.5 - 15.5 %   MCV 86.8 78.0 - 100.0 fL   MCH 28.7 26.0 - 34.0 pg   MCHC 33.1 30.0 - 36.0 g/dL   RDW 26.9 57.4 - 82.4 %   Platelets 218 150 - 400 K/uL   Neutrophils Relative %  67 %   Neutro Abs 5.8 1.7 - 7.7 K/uL   Lymphocytes Relative 19 %   Lymphs Abs 1.6 0.7 - 4.0 K/uL   Monocytes Relative 12 %   Monocytes Absolute 1.0 0.1 - 1.0 K/uL   Eosinophils Relative 2 %   Eosinophils Absolute 0.2 0.0 - 0.7 K/uL   Basophils Relative 0 %   Basophils Absolute 0.0 0.0 - 0.1 K/uL  Comprehensive metabolic panel     Status: Abnormal   Collection Time: 03/16/15  9:34 PM  Result Value  Ref Range   Sodium 140 135 - 145 mmol/L   Potassium 4.5 3.5 - 5.1 mmol/L   Chloride 106 101 - 111 mmol/L   CO2 27 22 - 32 mmol/L   Glucose, Bld 134 (H) 65 - 99 mg/dL   BUN 28 (H) 6 - 20 mg/dL   Creatinine, Ser 1.93 (H) 0.44 - 1.00 mg/dL   Calcium 8.8 (L) 8.9 - 10.3 mg/dL   Total Protein 6.3 (L) 6.5 - 8.1 g/dL   Albumin 3.9 3.5 - 5.0 g/dL   AST 74 (H) 15 - 41 U/L   ALT 73 (H) 14 - 54 U/L   Alkaline Phosphatase 101 38 - 126 U/L   Total Bilirubin 0.3 0.3 - 1.2 mg/dL   GFR calc non Af Amer 22 (L) >60 mL/min   GFR calc Af Amer 26 (L) >60 mL/min    Comment: (NOTE) The eGFR has been calculated using the CKD EPI equation. This calculation has not been validated in all clinical situations. eGFR's persistently <60 mL/min signify possible Chronic Kidney Disease.    Anion gap 7 5 - 15  Troponin I     Status: None   Collection Time: 03/16/15  9:34 PM  Result Value Ref Range   Troponin I <0.03 <0.031 ng/mL    Comment:        NO INDICATION OF MYOCARDIAL INJURY.   Urinalysis, Routine w reflex microscopic (not at [facility])     Status: Abnormal   Collection Time: 03/16/15  9:50 PM  Result Value Ref Range   Color, Urine YELLOW YELLOW   APPearance CLEAR CLEAR   Specific Gravity, Urine <1.005 (L) 1.005 - 1.030   pH 6.5 5.0 - 8.0   Glucose, UA NEGATIVE NEGATIVE mg/dL   Hgb urine dipstick TRACE (A) NEGATIVE   Bilirubin Urine NEGATIVE NEGATIVE   Ketones, ur NEGATIVE NEGATIVE mg/dL   Protein, ur NEGATIVE NEGATIVE mg/dL   Nitrite NEGATIVE NEGATIVE   Leukocytes, UA SMALL (A) NEGATIVE  Urine culture     Status: None   Collection Time: 03/16/15  9:50 PM  Result Value Ref Range   Specimen Description URINE, CLEAN CATCH    Special Requests NONE    Culture      MULTIPLE SPECIES PRESENT, SUGGEST RECOLLECTION Performed at [facility]    Report Status 03/18/2015 FINAL   Urine microscopic-add on     Status: Abnormal   Collection Time: 03/16/15  9:50 PM  Result Value Ref Range   Squamous  Epithelial / LPF 0-5 (A) NONE SEEN   WBC, UA 0-5 0 - 5 WBC/hpf   RBC / HPF 0-5 0 - 5 RBC/hpf   Bacteria, UA RARE (A) NONE SEEN  Glucose, capillary     Status: Abnormal   Collection Time: 03/17/15  1:01 AM  Result Value Ref Range   Glucose-Capillary 110 (H) 65 - 99 mg/dL  Ammonia     Status: Abnormal   Collection Time: 03/17/15  1:13 AM  Result Value Ref Range   Ammonia <9 (L) 9 - 35 umol/L  TSH     Status: Abnormal   Collection Time: 03/17/15  1:13 AM  Result Value Ref Range   TSH <0.010 (L) 0.350 - 4.500 uIU/mL  Vitamin B12     Status: None   Collection Time: 03/17/15  1:13 AM  Result Value Ref Range   Vitamin B-12 354 180 - 914 pg/mL    Comment: (NOTE) This assay is not validated for testing neonatal or myeloproliferative syndrome specimens for Vitamin B12 levels. Performed at [facility]   Glucose, capillary     Status: None   Collection Time: 03/17/15  4:21 AM  Result Value Ref Range   Glucose-Capillary 95 65 - 99 mg/dL  Basic metabolic panel     Status: Abnormal   Collection Time: 03/17/15  5:46 AM  Result Value Ref Range   Sodium 141 135 - 145 mmol/L   Potassium 3.9 3.5 - 5.1 mmol/L   Chloride 108 101 - 111 mmol/L   CO2 27 22 - 32 mmol/L   Glucose, Bld 92 65 - 99 mg/dL   BUN 23 (H) 6 - 20 mg/dL   Creatinine, Ser 1.38 (H) 0.44 - 1.00 mg/dL   Calcium 8.5 (L) 8.9 - 10.3 mg/dL   GFR calc non Af Amer 33 (L) >60 mL/min   GFR calc Af Amer 38 (L) >60 mL/min    Comment: (NOTE) The eGFR has been calculated using the CKD EPI equation. This calculation has not been validated in all clinical situations. eGFR's persistently <60 mL/min signify possible Chronic Kidney Disease.    Anion gap 6 5 - 15  CBC     Status: Abnormal   Collection Time: 03/17/15  5:46 AM  Result Value Ref Range   WBC 7.4 4.0 - 10.5 K/uL   RBC 3.72 (L) 3.87 - 5.11 MIL/uL   Hemoglobin 10.6 (L) 12.0 - 15.0 g/dL   HCT 32.2 (L) 36.0 - 46.0 %   MCV 86.6 78.0 - 100.0 fL   MCH 28.5 26.0 - 34.0 pg     MCHC 32.9 30.0 - 36.0 g/dL   RDW 14.0 11.5 - 15.5 %   Platelets 193 150 - 400 K/uL  Hemoglobin A1c     Status: Abnormal   Collection Time: 03/17/15  5:46 AM  Result Value Ref Range   Hgb A1c MFr Bld 6.4 (H) 4.8 - 5.6 %    Comment: (NOTE)         Pre-diabetes: 5.7 - 6.4         Diabetes: >6.4         Glycemic control for adults with diabetes: <7.0    Mean Plasma Glucose 137 mg/dL    Comment: (NOTE) Performed At: [facility] [address] [place], [place] [id number] Lindon Romp MD ZD:[id number]   Glucose, capillary     Status: None   Collection Time: 03/17/15  7:51 AM  Result Value Ref Range   Glucose-Capillary 96 65 - 99 mg/dL  Glucose, capillary     Status: Abnormal   Collection Time: 03/17/15 11:24 AM  Result Value Ref Range   Glucose-Capillary 100 (H) 65 - 99 mg/dL  Glucose, capillary     Status: Abnormal   Collection Time: 03/17/15  4:48 PM  Result Value Ref Range   Glucose-Capillary 131 (H) 65 - 99 mg/dL   Comment 1 Notify RN   Glucose, capillary     Status:  None   Collection Time: 03/17/15  7:56 PM  Result Value Ref Range   Glucose-Capillary 90 65 - 99 mg/dL   Comment 1 Notify RN    Comment 2 Document in Chart   Glucose, capillary     Status: None   Collection Time: 03/17/15 11:59 PM  Result Value Ref Range   Glucose-Capillary 88 65 - 99 mg/dL   Comment 1 Notify RN    Comment 2 Document in Chart   Glucose, capillary     Status: Abnormal   Collection Time: 03/18/15  4:24 AM  Result Value Ref Range   Glucose-Capillary 109 (H) 65 - 99 mg/dL   Comment 1 Notify RN    Comment 2 Document in Chart   Basic metabolic panel     Status: Abnormal   Collection Time: 03/18/15  6:24 AM  Result Value Ref Range   Sodium 142 135 - 145 mmol/L   Potassium 3.8 3.5 - 5.1 mmol/L   Chloride 109 101 - 111 mmol/L   CO2 26 22 - 32 mmol/L   Glucose, Bld 104 (H) 65 - 99 mg/dL   BUN 18 6 - 20 mg/dL   Creatinine, Ser 0.94 0.44 - 1.00 mg/dL   Calcium 9.0 8.9 - 10.3  mg/dL   GFR calc non Af Amer 53 (L) >60 mL/min   GFR calc Af Amer >60 >60 mL/min    Comment: (NOTE) The eGFR has been calculated using the CKD EPI equation. This calculation has not been validated in all clinical situations. eGFR's persistently <60 mL/min signify possible Chronic Kidney Disease.    Anion gap 7 5 - 15  CBC     Status: Abnormal   Collection Time: 03/18/15  6:24 AM  Result Value Ref Range   WBC 9.6 4.0 - 10.5 K/uL   RBC 4.04 3.87 - 5.11 MIL/uL   Hemoglobin 11.6 (L) 12.0 - 15.0 g/dL   HCT 34.5 (L) 36.0 - 46.0 %   MCV 85.4 78.0 - 100.0 fL   MCH 28.7 26.0 - 34.0 pg   MCHC 33.6 30.0 - 36.0 g/dL   RDW 13.9 11.5 - 15.5 %   Platelets 209 150 - 400 K/uL  Glucose, capillary     Status: None   Collection Time: 03/18/15  7:49 AM  Result Value Ref Range   Glucose-Capillary 98 65 - 99 mg/dL  Glucose, capillary     Status: Abnormal   Collection Time: 03/18/15 11:15 AM  Result Value Ref Range   Glucose-Capillary 119 (H) 65 - 99 mg/dL   Comment 1 Notify RN   Glucose, capillary     Status: Abnormal   Collection Time: 03/18/15  4:39 PM  Result Value Ref Range   Glucose-Capillary 110 (H) 65 - 99 mg/dL    Lipid Panel No results for input(s): CHOL, TRIG, HDL, CHOLHDL, VLDL, LDLCALC in the last 72 hours.  Studies/Results:  The brain MRI and MRA are reviewed and person. There is several small increased signal seen on diffusion imaging involving the jaw by of the occipital parietal region on the left side and also tiny one involving the right side. This suggests possible cardioembolic phenomenon. There is also chronic infarct involving the lateral temporal lobe on the right extending somewhat to the occipital lobe associated with encephalomalacia and it increased signal involving the white matter on FLAIR imaging. This is also associated with reduced signal in multiple locations on the echo gradient contrasts indicating chronic hemorrhage. There is confluent and deep white matter  leukoencephalopathy. There is a tiny aneurysm involving the intracranial right ICA at the siphon on region.    1. Patchy acute to subacute posterior left MCA infarcts with no hemorrhage or mass effect. Several small subacute appearing lacunar infarcts also in the left PCA and right posterior MCA territory. 2. Chronic right PCA infarct with encephalomalacia and hemosiderin. Chronic micro hemorrhages in the posterior right MCA territory. Chronic lacunar infarcts in the deep white matter capsules and thalami. Mild progression of white matter disease since 2009. 3. Stable intracranial MRA since 2009. 4. 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 Chronic ICA siphon atherosclerosis without significant stenosis. 5. Chronic right PCA stenosis.  EEG This recording slightly abnormal showing a single sharp wave activity involving the left central temporal region. This can be correlated clinically with temporal onset epileptic seizures.    Medications:  Scheduled Meds: . amLODipine  10 mg Oral BH-q7a  . atorvastatin  20 mg Oral BH-q7a  . calcium carbonate  1-2 tablet Oral BID  . clopidogrel  75 mg Oral BH-q7a  . [START ON 03/19/2015] enoxaparin (LOVENOX) injection  40 mg Subcutaneous Q24H  . insulin aspart  0-9 Units Subcutaneous 6 times per day  . metoprolol  25 mg Oral BID  . pantoprazole  40 mg Oral Daily  . sodium chloride  3 mL Intravenous Q12H  . vitamin C  500 mg Oral BH-q7a   Continuous Infusions:  PRN Meds:.acetaminophen **OR** acetaminophen, alum & mag hydroxide-simeth, HYDROmorphone (DILAUDID) injection, ondansetron **OR** ondansetron (ZOFRAN) IV, oxyCODONE       Shalaine Payson A. Merlene Laughter, M.D.  Diplomate, Tax adviser of Psychiatry and Neurology ( Neurology).

## 2015-03-19 ENCOUNTER — Inpatient Hospital Stay (HOSPITAL_COMMUNITY): Payer: Medicare Other

## 2015-03-19 DIAGNOSIS — E0590 Thyrotoxicosis, unspecified without thyrotoxic crisis or storm: Secondary | ICD-10-CM | POA: Diagnosis present

## 2015-03-19 DIAGNOSIS — K219 Gastro-esophageal reflux disease without esophagitis: Secondary | ICD-10-CM | POA: Diagnosis present

## 2015-03-19 DIAGNOSIS — R4701 Aphasia: Secondary | ICD-10-CM | POA: Diagnosis present

## 2015-03-19 DIAGNOSIS — I6350 Cerebral infarction due to unspecified occlusion or stenosis of unspecified cerebral artery: Secondary | ICD-10-CM

## 2015-03-19 DIAGNOSIS — I10 Essential (primary) hypertension: Secondary | ICD-10-CM

## 2015-03-19 DIAGNOSIS — E7800 Pure hypercholesterolemia, unspecified: Secondary | ICD-10-CM | POA: Diagnosis present

## 2015-03-19 DIAGNOSIS — Z8249 Family history of ischemic heart disease and other diseases of the circulatory system: Secondary | ICD-10-CM | POA: Diagnosis not present

## 2015-03-19 DIAGNOSIS — M1990 Unspecified osteoarthritis, unspecified site: Secondary | ICD-10-CM | POA: Diagnosis present

## 2015-03-19 DIAGNOSIS — I2510 Atherosclerotic heart disease of native coronary artery without angina pectoris: Secondary | ICD-10-CM | POA: Diagnosis present

## 2015-03-19 DIAGNOSIS — R330 Drug induced retention of urine: Secondary | ICD-10-CM | POA: Diagnosis present

## 2015-03-19 DIAGNOSIS — I6789 Other cerebrovascular disease: Secondary | ICD-10-CM | POA: Diagnosis not present

## 2015-03-19 DIAGNOSIS — Z853 Personal history of malignant neoplasm of breast: Secondary | ICD-10-CM | POA: Diagnosis not present

## 2015-03-19 DIAGNOSIS — Z8673 Personal history of transient ischemic attack (TIA), and cerebral infarction without residual deficits: Secondary | ICD-10-CM | POA: Diagnosis not present

## 2015-03-19 DIAGNOSIS — I63512 Cerebral infarction due to unspecified occlusion or stenosis of left middle cerebral artery: Secondary | ICD-10-CM | POA: Diagnosis present

## 2015-03-19 DIAGNOSIS — R410 Disorientation, unspecified: Secondary | ICD-10-CM | POA: Diagnosis present

## 2015-03-19 DIAGNOSIS — N179 Acute kidney failure, unspecified: Secondary | ICD-10-CM | POA: Diagnosis present

## 2015-03-19 DIAGNOSIS — Z809 Family history of malignant neoplasm, unspecified: Secondary | ICD-10-CM | POA: Diagnosis not present

## 2015-03-19 DIAGNOSIS — G9340 Encephalopathy, unspecified: Secondary | ICD-10-CM | POA: Diagnosis not present

## 2015-03-19 DIAGNOSIS — Z7902 Long term (current) use of antithrombotics/antiplatelets: Secondary | ICD-10-CM | POA: Diagnosis not present

## 2015-03-19 DIAGNOSIS — T450X5A Adverse effect of antiallergic and antiemetic drugs, initial encounter: Secondary | ICD-10-CM | POA: Diagnosis present

## 2015-03-19 DIAGNOSIS — R001 Bradycardia, unspecified: Secondary | ICD-10-CM | POA: Diagnosis present

## 2015-03-19 DIAGNOSIS — R569 Unspecified convulsions: Secondary | ICD-10-CM | POA: Diagnosis not present

## 2015-03-19 DIAGNOSIS — Z9011 Acquired absence of right breast and nipple: Secondary | ICD-10-CM | POA: Diagnosis not present

## 2015-03-19 DIAGNOSIS — E119 Type 2 diabetes mellitus without complications: Secondary | ICD-10-CM | POA: Diagnosis present

## 2015-03-19 LAB — LIPID PANEL
CHOL/HDL RATIO: 2.8 ratio
CHOLESTEROL: 98 mg/dL (ref 0–200)
HDL: 35 mg/dL — AB (ref >40)
LDL CALC: 55 mg/dL (ref 0–99)
TRIGLYCERIDES: 42 mg/dL (ref <150)
VLDL: 8 mg/dL (ref 0–40)

## 2015-03-19 LAB — GLUCOSE, CAPILLARY
GLUCOSE-CAPILLARY: 106 mg/dL — AB (ref 65–99)
GLUCOSE-CAPILLARY: 118 mg/dL — AB (ref 65–99)
Glucose-Capillary: 93 mg/dL (ref 65–99)
Glucose-Capillary: 91 mg/dL (ref 65–99)
Glucose-Capillary: 107 mg/dL — ABNORMAL HIGH (ref 65–99)

## 2015-03-19 LAB — HOMOCYSTEINE: Homocysteine: 10.3 umol/L (ref 0.0–15.0)

## 2015-03-19 NOTE — Progress Notes (Signed)
Dr. Legrand Rams paged to inform about patients fall. Awaiting call back

## 2015-03-19 NOTE — Evaluation (Signed)
Speech Language Pathology Evaluation Patient Details Name: Caroline Aguirre MRN: [id number] DOB: 03/18/1925 Today's Date: 03/19/2015 Time: [id number] SLP Time Calculation (min) (ACUTE ONLY): 46 min  Problem List:  Patient Active Problem List   Diagnosis Date Noted  . ARF (acute renal failure) ([place]) 03/16/2015  . Acute encephalopathy 03/16/2015  . DM type 2 (diabetes mellitus, type 2) ([place]) 03/16/2015  . Urinary retention 03/16/2015  . AKI (acute kidney injury) ([place]) 03/16/2015  . Coronary artery disease 03/16/2015  . Essential hypertension 03/16/2015  . Sinus bradycardia 03/16/2015  . Vulvar irritation 07/17/2013  . SBO (small bowel obstruction) ([place]) 09/03/2011  . Cyst of breast, right, solitary 09/07/2010  . Fatigue/loss of sleep 09/07/2010  . High blood pressure 09/07/2010  . Bronchitis 09/07/2010  . Skin moles (abnormal) 09/07/2010  . Gastroesophageal reflux disease with hiatal hernia 09/07/2010  . Hearing loss 09/07/2010  . Wears glasses and/or contacts 09/07/2010  . Glaucoma 09/07/2010  . Menopause 09/07/2010  . Herniated disc   . Shingles   . Hardening of the arteries of the brain   . Stroke ([place])   . Hx Breast cancer, DCIS, Right 08/19/2010   Past Medical History:  Past Medical History  Diagnosis Date  . Herniated disc   . Cyst of breast     Right - at 89 yrs of age.  . Shingles   . Hardening of the arteries of the brain   . Stroke [facility]) 2004    cerebral hemmorhage  . Diverticulitis   . Arthritis   . 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 Hx Breast cancer, DCIS, Right 08/19/2010  . Migraine headache   . SBO (small bowel obstruction) ([place]) 09/02/11  . Elevated cholesterol   . Heart disease   . GERD (gastroesophageal reflux disease)   . Coronary artery disease   . Hypertension   . Cancer ([place])     Rt Breast  . Diabetes mellitus without complication [facility])    Past Surgical History:  Past Surgical History   Procedure Laterality Date  . Brain surgery      hemorrhage  . Cystectomy    . Spine surgery      repair herniated disc  . Fibroid tumors    . Breast surgery  09/25/2010    mastectomy  . Knee arthroscopy Right     right knee x 3  . Laparoscopic bilateral salpingo oopherectomy     HPI:  STEPHANEY Aguirre is a 81 y.o. female with a remote hx of Breast Cancer 1998 S/p Right Mastectomy, CAD, HTN, DM2 who presents to the ED with increased confusion and 2 falls since the afternoon. Per her son who is at the bedside, she was talking out of her head. Patient at baseline is alert and oriented. She has not had any fevers chills or complaints of chest pain. She reports taking increased Benadryl lately. Although CT scan of the Head was performed and was negative for acute findings; MRI Imaging shows acute left MCA infarcts. The renal failure also is contributing to the confusion. Risk factors for the patient's stroke, age, previous infarcts, hypertension and diabetes. The infarcts suggests cardioembolic etiology.    Assessment / Plan / Recommendation Clinical Impression  The pt presents with left MCA infarcts and presents with mild receptive and expressive aphasia. The patient was very pleasant and followed up to 3 step instructions appropriately independent of cueing. She answered basic biographical y/n questions and  y/n immediate environment questions with 100% accuracy although she was unable to answer y/n questions of increasing complexity. Regarding verbal expression, She demonstrates normal automatic responses, normal repetition and naming; her deficits are found in spontaneous speech. She demonstrated occasional paraphasias of which she was unaware, incorrect descriptions (e.g. a boy opening a cabinet on a stool in a picture pt reports "he's coloring a picture") and some hesitations (word finding). Per son and nursing patient has made good progress and demonstrated some spontaneous recovery with  good prognosis for continued improvement; if patient continues to demonstrate these deficits noted above, recommend outpatient or home health skilled ST to address aphasia.     SLP Assessment  Patient needs continued Speech Lanaguage Pathology Services    Follow Up Recommendations  Home health SLP;Outpatient SLP    Frequency and Duration min 2x/week  2 weeks      SLP Evaluation Prior Functioning  Cognitive/Linguistic Baseline: Information not available Type of Home: House Available Help at Discharge: Family;Available 24 hours/day   Cognition  Overall Cognitive Status: Impaired/Different from baseline (d/t severity of expressive aphasia) Arousal/Alertness: Awake/alert Orientation Level: Oriented to person;Oriented to place (pt not aware of stroke)    Comprehension  Auditory Comprehension Overall Auditory Comprehension: Appears within functional limits for tasks assessed Yes/No Questions: Impaired Basic Biographical Questions: 76-100% accurate Basic Immediate Environment Questions: 75-100% accurate Complex Questions: 0-24% accurate Commands: Within Functional Limits Visual Recognition/Discrimination Discrimination: Within Function Limits Reading Comprehension Reading Status: Within funtional limits    Expression Expression Primary Mode of Expression: Verbal Verbal Expression Overall Verbal Expression: Impaired Initiation: No impairment Automatic Speech: Name;Social Response;Counting;Day of week Level of Generative/Spontaneous Verbalization: Conversation Repetition: No impairment Naming: No impairment Pragmatics: No impairment Written Expression Dominant Hand: Right   Oral / Motor Motor Speech Overall Motor Speech: Appears within functional limits for tasks assessed Respiration: Within functional limits Phonation: Normal Resonance: Within functional limits Articulation: Within functional limitis Intelligibility: Intelligible Motor Planning: Witnin functional  limits Motor Speech Errors: Not applicable   [place] H. Roddie Mc, CCC-SLP Speech Language Pathologist  Wende Bushy 03/19/2015, 4:20 PM

## 2015-03-19 NOTE — Progress Notes (Signed)
Called patients son home number listed but no one answered and no answer  machine picked up after several rings.

## 2015-03-19 NOTE — Progress Notes (Addendum)
Patient Details Name: Caroline Aguirre MRN: [id number] DOB: [birth year]   Order received for BSE however upon further review, no swallow needs identified. Patient does display speech and language needs. SLP to complete SLE.         Derrious Bologna H. Roddie Mc, CCC-SLP Speech Language Pathologist   Wende Bushy 03/19/2015, 1:51 PM

## 2015-03-19 NOTE — Evaluation (Signed)
Occupational Therapy Evaluation Patient Details Name: Caroline Aguirre MRN: [id number] DOB: 11/11/1924 Today's Date: 03/19/2015    History of Present Illness Caroline Aguirre is a 81 y.o. female with a remote hx of Breast Cancer 1998 S/p Right Mastectomy, CAD, HTN, DM2 who presents to the ED with increased confusion and 2 falls since the afternoon. Per her son who is at the bedside, she was talking out of her head. Patient at baseline is alert and oriented. She has not had any fevers chills or complaints of chest pain. She reports taking increased Benadryl lately. A CT scan of the Head was performed and was negative for acute findings. She was also found to have a large post voiding residual and a Foley catheter was placed. Her labs revealed an elevation in her BUN/Cr and she was started on IVFs and referred for medical observation.Acute encephalopathy.Imaging shows acute left MCA infarct. The patient's difficulty comprehending is likely due to the aphasia from her stroke. The renal failure also is contributing to the confusion. Risk factors for the patient's stroke, age, previous infarcts, hypertension and diabetes. The infarcts suggests cardioembolic etiology   Clinical Impression   Pt awake, alert, and oriented to person and place this am, sitter in room for evaluation. Pt on BSC upon OT arrival, OT observed pt complete toileting tasks, pt required min guard for hygiene and min assist for transfer back to EOB. Pt is mod I in bed mobility. Pt is able to complete ADL tasks with intermittent cuing, suspect this is baseline. Pt demonstrates some confusion at times, unsure if this is baseline or due to residual aphasia. She was able to follow simple commands easily, cuing for multiple step commands. Son not present to determine if pt is at functional baseline. No further acute OT services required at this time, pt will need 24/7 supervision on discharge.     Follow Up Recommendations  No OT  follow up;Supervision/Assistance - 24 hour    Equipment Recommendations  None recommended by OT       Precautions / Restrictions Precautions Precautions: Fall Restrictions Weight Bearing Restrictions: No      Mobility Bed Mobility Overal bed mobility: Modified Independent                Transfers Overall transfer level: Needs assistance   Transfers: Sit to/from Stand Sit to Stand: Supervision                   ADL Overall ADL's : Needs assistance/impaired Eating/Feeding: Set up                   Lower Body Dressing: Modified independent;Sitting/lateral leans   Toilet Transfer: Minimal assistance;BSC (OT observed pt and nurse tech)   Toileting- Water quality scientist and Hygiene: Min guard;Sit to/from stand (OT observed pt and nurse tech)               Vision Vision Assessment?: Yes Eye Alignment: Within Functional Limits Ocular Range of Motion: Within Functional Limits Alignment/Gaze Preference: Within Defined Limits Tracking/Visual Pursuits: Able to track stimulus in all quads without difficulty Saccades: Within functional limits Convergence: Within functional limits Visual Fields: No apparent deficits          Pertinent Vitals/Pain Pain Assessment: No/denies pain     Hand Dominance Right   Extremity/Trunk Assessment Upper Extremity Assessment Upper Extremity Assessment: Overall WFL for tasks assessed   Lower Extremity Assessment Lower Extremity Assessment: Defer to PT evaluation  Communication Communication Communication: Expressive difficulties (occasional difficulty with word finding )   Cognition Arousal/Alertness: Awake/alert Behavior During Therapy: WFL for tasks assessed/performed Overall Cognitive Status: Within Functional Limits for tasks assessed                                Home Living Family/patient expects to be discharged to:: Private residence Living Arrangements: Children Available  Help at Discharge: Family;Available 24 hours/day (per pt report)               Bathroom Shower/Tub: Teacher, early years/pre: Standard     Home Equipment: Bedside commode;Shower seat;Cane - single point          Prior Functioning/Environment Level of Independence: Independent with assistive device(s)        Comments: pt reports she uses a cane in the community and sometimes at home if she is tired.     OT Diagnosis: Cognitive deficits   OT Problem List: Decreased cognition    End of Session    Activity Tolerance: Patient tolerated treatment well Patient left: in bed;with call bell/phone within reach;with nursing/sitter in room   Time: 0850-0909 OT Time Calculation (min): 19 min Charges:  OT General Charges $OT Visit: 1 Procedure OT Evaluation $Initial OT Evaluation Tier I: 1 Procedure G-Codes: OT G-codes **NOT FOR INPATIENT CLASS** Functional Assessment Tool Used: clinical judgement Functional Limitation: Self care Self Care Current Status [id number]): At least 20 percent but less than 40 percent impaired, limited or restricted Self Care Goal Status [id number]): At least 20 percent but less than 40 percent impaired, limited or restricted Self Care Discharge Status [phone]): At least 20 percent but less than 40 percent impaired, limited or restricted  Guadelupe Sabin, OTR/L  [phone] 03/19/2015, 9:15 AM

## 2015-03-19 NOTE — Progress Notes (Signed)
Patient fell on the floor, no harm done. No complaints of pain. Family and MD to be called now.

## 2015-03-19 NOTE — Evaluation (Addendum)
Physical Therapy Evaluation Patient Details Name: Caroline Aguirre MRN: [id number] DOB: 1925-01-31 Today's Date: 03/19/2015   History of Present Illness  Caroline Aguirre is a 81 y.o. female with a remote hx of Breast Cancer 1998 S/p Right Mastectomy, CAD, HTN, DM2 who presents to the ED with increased confusion and 2 falls since the afternoon. Per her son who is at the bedside, she was talking out of her head. Patient at baseline is alert and oriented. She has not had any fevers chills or complaints of chest pain. She reports taking increased Benadryl lately. A CT scan of the Head was performed and was negative for acute findings. She was also found to have a large post voiding residual and a Foley catheter was placed. Her labs revealed an elevation in her BUN/Cr and she was started on IVFs and referred for medical observation.Acute encephalopathy.Imaging shows acute left MCA infarct. The patient's difficulty comprehending is likely due to the aphasia from her stroke. The renal failure also is contributing to the confusion. Risk factors for the patient's stroke, age, previous infarcts, hypertension and diabetes. The infarcts suggests cardioembolic etiology  Clinical Impression   Pt was seen for evaluation.  She was alert and oriented, no aphasia noted.  Her strength was WNL and equal from left to right.  Coordination WNL.  She is able to ambulate with no assistive device but occasionally scissors LEs due to narrow base of support.  She was instructed in widening her base of support with which she was successful.  She is most stable ambulating with a walker and she will continue to do this outside of the home.  She is essentially at prior functional baseline.    Follow Up Recommendations No PT follow up    Equipment Recommendations  None recommended by PT    Recommendations for Other Services   none    Precautions / Restrictions Precautions Precautions: Fall Restrictions Weight  Bearing Restrictions: No      Mobility  Bed Mobility Overal bed mobility: Independent                Transfers Overall transfer level: Independent Equipment used: Rolling walker (2 wheeled);None Transfers: Sit to/from Stand Sit to Stand: Supervision            Ambulation/Gait Ambulation/Gait assistance: Supervision Ambulation Distance (Feet): 300 Feet Assistive device: Rolling walker (2 wheeled);None Gait Pattern/deviations: Narrow base of support;Trunk flexed   Gait velocity interpretation: >2.62 ft/sec, indicative of independent community ambulator General Gait Details: tends to almost scissor LEs at times...instructed in widening base of support to increase stabiiity and she was able to maintain this throughout gait  Stairs            Wheelchair Mobility    Modified Rankin (Stroke Patients Only)       Balance Overall balance assessment: Needs assistance Sitting-balance support: No upper extremity supported;Feet supported Sitting balance-Leahy Scale: Good     Standing balance support: Bilateral upper extremity supported Standing balance-Leahy Scale: Good Standing balance comment: balance is best with a walker                             Pertinent Vitals/Pain Pain Assessment: No/denies pain    Home Living Family/patient expects to be discharged to:: Private residence Living Arrangements: Children Available Help at Discharge: Family;Available 24 hours/day Type of Home: House Home Access: Stairs to enter Entrance Stairs-Rails: Right Entrance Stairs-Number of Steps: 2 Home Layout:  Two level;Able to live on main level with bedroom/bathroom Home Equipment: Bedside commode;Shower seat;Cane - single point      Prior Function Level of Independence: Independent with assistive device(s)         Comments: uses a walker outside of the home     Hand Dominance   Dominant Hand: Right    Extremity/Trunk Assessment   Upper  Extremity Assessment: Defer to OT evaluation           Lower Extremity Assessment: Overall WFL for tasks assessed         Communication   Communication: No difficulties  Cognition Arousal/Alertness: Awake/alert Behavior During Therapy: WFL for tasks assessed/performed Overall Cognitive Status: Within Functional Limits for tasks assessed                      General Comments      Exercises        Assessment/Plan    PT Assessment Patent does not need any further PT services  PT Diagnosis     PT Problem List    PT Treatment Interventions     PT Goals (Current goals can be found in the Care Plan section) Acute Rehab PT Goals PT Goal Formulation: All assessment and education complete, DC therapy    Frequency     Barriers to discharge        Co-evaluation               End of Session Equipment Utilized During Treatment: Gait belt Activity Tolerance: Patient tolerated treatment well Patient left: in bed;with call bell/phone within reach;with nursing/sitter in room Nurse Communication: Mobility status         Time: [id number] PT Time Calculation (min) (ACUTE ONLY): 24 min   Charges:   PT Evaluation $Initial PT Evaluation Tier I: 1 Procedure     PT G Codes:    clinical judgement                                                                                Walking/mobility                                                                               Evaluation:  CI                                                                                Goal:  CI  Discharge:  CI Sable Feil  PT 03/19/2015, 10:25 AM [phone]

## 2015-03-19 NOTE — Progress Notes (Signed)
Patient ID: Caroline Aguirre, female   DOB: 13-Jan-1925, 81 y.o.   MRN: [id number]   [place] A. Merlene Laughter, MD     [email or web address]          Caroline Aguirre is an 81 y.o. female.   Assessment/Plan: Acute encephalopathy.Imaging shows acute left MCA infarct and small R occipital suggestive of cardio-embolic phenomena. The patient's difficulty comprehending is likely due to the aphasia from her stroke. The renal failure also is contributing to the confusion. Risk factors for the patient's stroke, age, previous infarcts, hypertension and diabetes. The infarcts suggests cardioembolic etiology.  Hyperthyroidism. Treatment deferred to primary care provider.  Asymptomatic small aneurysm. Recommend observation given the small size and the patient's age.  Slightly abnormal EEG. This will be observed for now.   RECOMMENDATION: Lipid panel. Speech, occupational and the physical therapy evaluation. 30 day Holter monitor to evaluate for atrial fibrillation as a cause of her strokes. ECHO AND CAROTID  Plavix will be switched to aspirin. Avoid psychotropic medications.     There appears to be some improvement in cognition and language today.   GENERAL: She is in no acute distress.  HEENT: Supple. Atraumatic normocephalic.   ABDOMEN: soft  EXTREMITIES: No edema   BACK: Normal.  SKIN: Normal by inspection.   MENTAL STATUS: She is awake and alert although she seems more irritated today. Naming is scared and comprehension is much improved but she is disoriented. She thinks is in a doctor's office. She does follow commands with less prompting today.  CRANIAL NERVES: Pupils are equal, round and reactive to light; extra ocular movements are full, there is no significant nystagmus; visual fields are full; upper and lower facial muscles are normal in strength and symmetric, there is no flattening of the nasolabial folds; tongue is midline; uvula is midline; shoulder  elevation is normal.  MOTOR: She has at least antigravity strength in all 4 extremities but the exact grading is difficult because of difficulty with the patient comprehend and commands. There appears to be normal bulk and tone throughout. COORDINATION: Left finger to nose is normal, right finger to nose is normal. No tremors or dysmetrias appreciated.  REFLEXES: Deep tendon reflexes are symmetrical and normal. Babinski reflexes are flexor bilaterally.   SENSATION: Normal to light touch.     Objective: Vital signs in last 24 hours: Temp:  [98 F (36.7 C)-98.6 F (37 C)] 98.3 F (36.8 C) (12/28 1327) Pulse Rate:  [62-69] 62 (12/28 1327) Resp:  [20] 20 (12/28 1327) BP: (112-133)/(35-84) 122/35 mmHg (12/28 1327) SpO2:  [97 %-99 %] 98 % (12/28 1327)  Intake/Output from previous day: 12/27 0701 - 12/28 0700 In: 920 [P.O.:920] Out: 1250 [Urine:1250] Intake/Output this shift: Total I/O In: 360 [P.O.:360] Out: 600 [Urine:600] Nutritional status: Diet Heart Room service appropriate?: Yes; Fluid consistency:: Thin   Lab Results: Results for orders placed or performed during the hospital encounter of 03/16/15 (from the past 48 hour(s))  Glucose, capillary     Status: None   Collection Time: 03/17/15  7:56 PM  Result Value Ref Range   Glucose-Capillary 90 65 - 99 mg/dL   Comment 1 Notify RN    Comment 2 Document in Chart   Glucose, capillary     Status: None   Collection Time: 03/17/15 11:59 PM  Result Value Ref Range   Glucose-Capillary 88 65 - 99 mg/dL   Comment 1 Notify RN    Comment 2 Document in Chart   Glucose, capillary  Status: Abnormal   Collection Time: 03/18/15  4:24 AM  Result Value Ref Range   Glucose-Capillary 109 (H) 65 - 99 mg/dL   Comment 1 Notify RN    Comment 2 Document in Chart   Basic metabolic panel     Status: Abnormal   Collection Time: 03/18/15  6:24 AM  Result Value Ref Range   Sodium 142 135 - 145 mmol/L   Potassium 3.8 3.5 - 5.1 mmol/L    Chloride 109 101 - 111 mmol/L   CO2 26 22 - 32 mmol/L   Glucose, Bld 104 (H) 65 - 99 mg/dL   BUN 18 6 - 20 mg/dL   Creatinine, Ser 0.94 0.44 - 1.00 mg/dL   Calcium 9.0 8.9 - 10.3 mg/dL   GFR calc non Af Amer 53 (L) >60 mL/min   GFR calc Af Amer >60 >60 mL/min    Comment: (NOTE) The eGFR has been calculated using the CKD EPI equation. This calculation has not been validated in all clinical situations. eGFR's persistently <60 mL/min signify possible Chronic Kidney Disease.    Anion gap 7 5 - 15  CBC     Status: Abnormal   Collection Time: 03/18/15  6:24 AM  Result Value Ref Range   WBC 9.6 4.0 - 10.5 K/uL   RBC 4.04 3.87 - 5.11 MIL/uL   Hemoglobin 11.6 (L) 12.0 - 15.0 g/dL   HCT 34.5 (L) 36.0 - 46.0 %   MCV 85.4 78.0 - 100.0 fL   MCH 28.7 26.0 - 34.0 pg   MCHC 33.6 30.0 - 36.0 g/dL   RDW 13.9 11.5 - 15.5 %   Platelets 209 150 - 400 K/uL  Homocysteine     Status: None   Collection Time: 03/18/15  6:24 AM  Result Value Ref Range   Homocysteine 10.3 0.0 - 15.0 umol/L    Comment: (NOTE) Performed At: [facility] [address] [place], [place] [id number] Lindon Romp MD OF:[id number]   Glucose, capillary     Status: None   Collection Time: 03/18/15  7:49 AM  Result Value Ref Range   Glucose-Capillary 98 65 - 99 mg/dL  Glucose, capillary     Status: Abnormal   Collection Time: 03/18/15 11:15 AM  Result Value Ref Range   Glucose-Capillary 119 (H) 65 - 99 mg/dL   Comment 1 Notify RN   Glucose, capillary     Status: Abnormal   Collection Time: 03/18/15  4:39 PM  Result Value Ref Range   Glucose-Capillary 110 (H) 65 - 99 mg/dL  Glucose, capillary     Status: Abnormal   Collection Time: 03/18/15  8:05 PM  Result Value Ref Range   Glucose-Capillary 144 (H) 65 - 99 mg/dL  Glucose, capillary     Status: Abnormal   Collection Time: 03/18/15 11:25 PM  Result Value Ref Range   Glucose-Capillary 102 (H) 65 - 99 mg/dL  Glucose, capillary     Status: None    Collection Time: 03/19/15  4:12 AM  Result Value Ref Range   Glucose-Capillary 93 65 - 99 mg/dL  Lipid panel     Status: Abnormal   Collection Time: 03/19/15  6:08 AM  Result Value Ref Range   Cholesterol 98 0 - 200 mg/dL   Triglycerides 42 <150 mg/dL   HDL 35 (L) >40 mg/dL   Total CHOL/HDL Ratio 2.8 RATIO   VLDL 8 0 - 40 mg/dL   LDL Cholesterol 55 0 - 99 mg/dL    Comment:  Total Cholesterol/HDL:CHD Risk Coronary Heart Disease Risk Table                     Men   Women  1/2 Average Risk   3.4   3.3  Average Risk       5.0   4.4  2 X Average Risk   9.6   7.1  3 X Average Risk  23.4   11.0        Use the calculated Patient Ratio above and the CHD Risk Table to determine the patient's CHD Risk.        ATP III CLASSIFICATION (LDL):  <100     mg/dL   Optimal  758-307  mg/dL   Near or Above                    Optimal  130-159  mg/dL   Borderline  460-029  mg/dL   High  >847     mg/dL   Very High   Glucose, capillary     Status: None   Collection Time: 03/19/15  7:39 AM  Result Value Ref Range   Glucose-Capillary 91 65 - 99 mg/dL  Glucose, capillary     Status: Abnormal   Collection Time: 03/19/15 11:45 AM  Result Value Ref Range   Glucose-Capillary 106 (H) 65 - 99 mg/dL  Glucose, capillary     Status: Abnormal   Collection Time: 03/19/15  4:26 PM  Result Value Ref Range   Glucose-Capillary 107 (H) 65 - 99 mg/dL   Comment 1 Notify RN    Comment 2 Document in Chart     Lipid Panel  Recent Labs  03/19/15 0608  CHOL 98  TRIG 42  HDL 35*  CHOLHDL 2.8  VLDL 8  LDLCALC 55    Studies/Results:  The brain MRI and MRA are reviewed and person. There is several small increased signal seen on diffusion imaging involving the jaw by of the occipital parietal region on the left side and also tiny one involving the right side. This suggests possible cardioembolic phenomenon. There is also chronic infarct involving the lateral temporal lobe on the right extending somewhat  to the occipital lobe associated with encephalomalacia and it increased signal involving the white matter on FLAIR imaging. This is also associated with reduced signal in multiple locations on the echo gradient contrasts indicating chronic hemorrhage. 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 There is a tiny aneurysm involving the intracranial right ICA at the siphon on region.    1. Patchy acute to subacute posterior left MCA infarcts with no hemorrhage or mass effect. Several small subacute appearing lacunar infarcts also in the left PCA and right posterior MCA territory. 2. Chronic right PCA infarct with encephalomalacia and hemosiderin. Chronic micro hemorrhages in the posterior right MCA territory. Chronic lacunar infarcts in the deep white matter capsules and thalami. Mild progression of white matter disease since 2009. 3. Stable intracranial MRA since 2009. 4. 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 Chronic ICA siphon atherosclerosis without significant stenosis. 5. Chronic right PCA stenosis.  EEG This recording slightly abnormal showing a single sharp wave activity involving the left central temporal region. This can be correlated clinically with temporal onset epileptic seizures.   ECHO - Left ventricle: The cavity size was normal. Wall thickness was   normal. Systolic function was normal. The estimated ejection   fraction was in the  range of 60% to 65%. Wall motion was normal;   there were no regional wall motion abnormalities. Features are   consistent with a pseudonormal left ventricular filling pattern,   with concomitant abnormal relaxation and increased filling   pressure (grade 2 diastolic dysfunction). Doppler parameters are   consistent with high ventricular filling pressure. - Aortic valve: Moderately calcified annulus. Trileaflet. There was   no stenosis. There was mild regurgitation. - Mitral  valve: Calcified annulus. There was mild regurgitation. - Left atrium: The atrium was moderately dilated. - Right atrium: The atrium was moderately dilated. - Atrial septum: No defect or patent foramen ovale was identified. - Tricuspid valve: There was moderate regurgitation. - Pulmonary arteries: Systolic pressure was moderately increased.   PA peak pressure: 50 mm Hg (S).    CAROTID DOPPLERS Minimal amount of intimal wall thickening and atherosclerotic plaque bilaterally, not resulting in hemodynamically significant stenosis.     Medications:  Scheduled Meds: . amLODipine  10 mg Oral BH-q7a  . aspirin  81 mg Oral Daily  . atorvastatin  20 mg Oral BH-q7a  . calcium carbonate  1-2 tablet Oral BID  . clopidogrel  75 mg Oral BH-q7a  . enoxaparin (LOVENOX) injection  40 mg Subcutaneous Q24H  . insulin aspart  0-9 Units Subcutaneous 6 times per day  . metoprolol  25 mg Oral BID  . pantoprazole  40 mg Oral Daily  . sodium chloride  3 mL Intravenous Q12H  . vitamin C  500 mg Oral BH-q7a   Continuous Infusions:  PRN Meds:.acetaminophen **OR** acetaminophen, alum & mag hydroxide-simeth, HYDROmorphone (DILAUDID) injection, ondansetron **OR** ondansetron (ZOFRAN) IV, oxyCODONE     LOS: 0 days   Natacia Chaisson A. Merlene Laughter, M.D.  Diplomate, Tax adviser of Psychiatry and Neurology ( Neurology).

## 2015-03-19 NOTE — Progress Notes (Signed)
Subjective: Dr. Freddie Apley note is seen and appreciated. She has an acute stroke.  Objective: Vital signs in last 24 hours: Temp:  [98 F (36.7 C)-98.6 F (37 C)] 98.6 F (37 C) (12/28 0115) Pulse Rate:  [62-70] 62 (12/28 0115) Resp:  [20] 20 (12/28 0115) BP: (102-133)/(47-84) 133/58 mmHg (12/28 0115) SpO2:  [97 %-100 %] 97 % (12/28 0115) Weight change:  Last BM Date: 03/16/15  Intake/Output from previous day: 12/27 0701 - 12/28 0700 In: 920 [P.O.:920] Out: 1250 [Urine:1250]  PHYSICAL EXAM General appearance: alert and no distress Resp: clear to auscultation bilaterally Cardio: regular rate and rhythm, S1, S2 normal, no murmur, click, rub or gallop GI: soft, non-tender; bowel sounds normal; no masses,  no organomegaly Extremities: extremities normal, atraumatic, no cyanosis or edema  Lab Results:  Results for orders placed or performed during the hospital encounter of 03/16/15 (from the past 48 hour(s))  Glucose, capillary     Status: Abnormal   Collection Time: 03/17/15 11:24 AM  Result Value Ref Range   Glucose-Capillary 100 (H) 65 - 99 mg/dL  Glucose, capillary     Status: Abnormal   Collection Time: 03/17/15  4:48 PM  Result Value Ref Range   Glucose-Capillary 131 (H) 65 - 99 mg/dL   Comment 1 Notify RN   Glucose, capillary     Status: None   Collection Time: 03/17/15  7:56 PM  Result Value Ref Range   Glucose-Capillary 90 65 - 99 mg/dL   Comment 1 Notify RN    Comment 2 Document in Chart   Glucose, capillary     Status: None   Collection Time: 03/17/15 11:59 PM  Result Value Ref Range   Glucose-Capillary 88 65 - 99 mg/dL   Comment 1 Notify RN    Comment 2 Document in Chart   Glucose, capillary     Status: Abnormal   Collection Time: 03/18/15  4:24 AM  Result Value Ref Range   Glucose-Capillary 109 (H) 65 - 99 mg/dL   Comment 1 Notify RN    Comment 2 Document in Chart   Basic metabolic panel     Status: Abnormal   Collection Time: 03/18/15  6:24 AM   Result Value Ref Range   Sodium 142 135 - 145 mmol/L   Potassium 3.8 3.5 - 5.1 mmol/L   Chloride 109 101 - 111 mmol/L   CO2 26 22 - 32 mmol/L   Glucose, Bld 104 (H) 65 - 99 mg/dL   BUN 18 6 - 20 mg/dL   Creatinine, Ser 0.94 0.44 - 1.00 mg/dL   Calcium 9.0 8.9 - 10.3 mg/dL   GFR calc non Af Amer 53 (L) >60 mL/min   GFR calc Af Amer >60 >60 mL/min    Comment: (NOTE) The eGFR has been calculated using the CKD EPI equation. This calculation has not been validated in all clinical situations. eGFR's persistently <60 mL/min signify possible Chronic Kidney Disease.    Anion gap 7 5 - 15  CBC     Status: Abnormal   Collection Time: 03/18/15  6:24 AM  Result Value Ref Range   WBC 9.6 4.0 - 10.5 K/uL   RBC 4.04 3.87 - 5.11 MIL/uL   Hemoglobin 11.6 (L) 12.0 - 15.0 g/dL   HCT 34.5 (L) 36.0 - 46.0 %   MCV 85.4 78.0 - 100.0 fL   MCH 28.7 26.0 - 34.0 pg   MCHC 33.6 30.0 - 36.0 g/dL   RDW 13.9 11.5 - 15.5 %  Platelets 209 150 - 400 K/uL  Glucose, capillary     Status: None   Collection Time: 03/18/15  7:49 AM  Result Value Ref Range   Glucose-Capillary 98 65 - 99 mg/dL  Glucose, capillary     Status: Abnormal   Collection Time: 03/18/15 11:15 AM  Result Value Ref Range   Glucose-Capillary 119 (H) 65 - 99 mg/dL   Comment 1 Notify RN   Glucose, capillary     Status: Abnormal   Collection Time: 03/18/15  4:39 PM  Result Value Ref Range   Glucose-Capillary 110 (H) 65 - 99 mg/dL  Glucose, capillary     Status: Abnormal   Collection Time: 03/18/15  8:05 PM  Result Value Ref Range   Glucose-Capillary 144 (H) 65 - 99 mg/dL  Glucose, capillary     Status: Abnormal   Collection Time: 03/18/15 11:25 PM  Result Value Ref Range   Glucose-Capillary 102 (H) 65 - 99 mg/dL  Glucose, capillary     Status: None   Collection Time: 03/19/15  4:12 AM  Result Value Ref Range   Glucose-Capillary 93 65 - 99 mg/dL  Lipid panel     Status: Abnormal   Collection Time: 03/19/15  6:08 AM  Result Value  Ref Range   Cholesterol 98 0 - 200 mg/dL   Triglycerides 42 <150 mg/dL   HDL 35 (L) >40 mg/dL   Total CHOL/HDL Ratio 2.8 RATIO   VLDL 8 0 - 40 mg/dL   LDL Cholesterol 55 0 - 99 mg/dL    Comment:        Total Cholesterol/HDL:CHD Risk Coronary Heart Disease Risk Table                     Men   Women  1/2 Average Risk   3.4   3.3  Average Risk       5.0   4.4  2 X Average Risk   9.6   7.1  3 X Average Risk  23.4   11.0        Use the calculated Patient Ratio above and the CHD Risk Table to determine the patient's CHD Risk.        ATP III CLASSIFICATION (LDL):  <100     mg/dL   Optimal  100-129  mg/dL   Near or Above                    Optimal  130-159  mg/dL   Borderline  160-189  mg/dL   High  >190     mg/dL   Very High   Glucose, capillary     Status: None   Collection Time: 03/19/15  7:39 AM  Result Value Ref Range   Glucose-Capillary 91 65 - 99 mg/dL    ABGS No results for input(s): PHART, PO2ART, TCO2, HCO3 in the last 72 hours.  Invalid input(s): PCO2 CULTURES Recent Results (from the past 240 hour(s))  Urine culture     Status: None   Collection Time: 03/16/15  9:50 PM  Result Value Ref Range Status   Specimen Description URINE, CLEAN CATCH  Final   Special Requests NONE  Final   Culture   Final    MULTIPLE SPECIES PRESENT, SUGGEST RECOLLECTION Performed at [facility]    Report Status 03/18/2015 FINAL  Final   Studies/Results: Mr Virgel Paling Wo Contrast  03/18/2015  CLINICAL DATA:  81 year old female with altered mental status and confusion for 1  week. Recent fall. Initial encounter. EXAM: MRI HEAD WITHOUT CONTRAST MRA HEAD WITHOUT CONTRAST TECHNIQUE: Multiplanar, multiecho pulse sequences of the brain and surrounding structures were obtained without intravenous contrast. Angiographic images of the head were obtained using MRA technique without contrast. COMPARISON:  Head CT without contrast 03/16/2015. Brain MRI and MRA 08/16/2007. FINDINGS: MRI HEAD  FINDINGS Scattered small gyriform foci of abnormal trace diffusion signal in the left parietal lobe (series 100, image 30), mildly confluent in some areas. Several superimposed areas of abnormal trace diffusion in the lateral left occipital lobe (images 19 and 20). Superimposed solitary area of abnormal trace diffusion in the right motor strip near the left upper extremity representation on image 37. Most of these appear isointense or at most mildly restricted on ADC. No other right hemisphere diffusion abnormality. Posterior fossa is spared. Major intracranial vascular flow voids are stable. Chronic right PCA territory encephalomalacia with hemosiderin has not significantly changed since 2009. Scattered small chronic micro hemorrhages also in the posterior right MCA territory appear stable. Chronic lacunar infarcts in the deep white matter capsules and to a lesser extent via medial thalami I have not significantly changed. Patchy and confluent cerebral white matter T2 and FLAIR hyperintensity elsewhere has mildly progressed. No other cortical encephalomalacia identified. The brainstem and cerebellum remain normal for age. No midline shift, mass effect, evidence of mass lesion, ventriculomegaly, extra-axial collection or acute intracranial hemorrhage. Cervicomedullary junction and pituitary are within normal limits. Visible internal auditory structures appear normal. Progressed C2-C3 spinal degeneration now with mild spinal stenosis. Stable bone marrow signal. No acute orbit or scalp soft tissue findings. Negative paranasal sinuses. MRA HEAD FINDINGS Stable antegrade flow in the posterior circulation. Stable distal vertebral arteries, the left appears dominant an the right functionally terminates in PICA. Stable basilar artery without stenosis. SCA and PCA origins are within normal limits. Right posterior communicating artery remains normal. The left is diminutive as before. Bilateral PCA branches are stable with  moderate irregularity and stenosis in the right P2 segment. Antegrade flow in both ICA siphons is stable. Bilateral siphon atherosclerosis and irregularity without siphon stenosis identified. There is a small chronic distal right cavernous siphon 3 mm aneurysm suspected (series 3, image 72) directed inferiorly and laterally. This has not significantly changed. Ophthalmic and right posterior communicating artery origins remain normal. Stable carotid termini, diminutive or absent left ACA A1 segment. Normal right ACA origin an anterior communicating artery. Visualized ACA branches remain within normal limits. Right MCA origin, M1 segment, and right MCA branches are stable. Left MCA origin remains normal. Left M1 segment, bifurcation, and visualized left MCA branches are stable. No left MCA branch occlusion identified. IMPRESSION: 1. Patchy acute to subacute posterior left MCA infarcts with no hemorrhage or mass effect. Several small subacute appearing lacunar infarcts also in the left PCA and right posterior MCA territory. 2. Chronic right PCA infarct with encephalomalacia and hemosiderin. Chronic micro hemorrhages in the posterior right MCA territory. Chronic lacunar infarcts in the deep white matter capsules and thalami. Mild progression of white matter disease since 2009. 3. Stable intracranial MRA since 2009. 4. 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 Chronic ICA siphon atherosclerosis without significant stenosis. 5. Chronic right PCA stenosis. Electronically Signed   By: Genevie Ann M.D.   On: 03/18/2015 09:25   Mr Brain Wo Contrast  03/18/2015  CLINICAL DATA:  81 year old female with altered mental status and confusion for 1 week. Recent fall. Initial encounter.  EXAM: MRI HEAD WITHOUT CONTRAST MRA HEAD WITHOUT CONTRAST TECHNIQUE: Multiplanar, multiecho pulse sequences of the brain and surrounding structures were obtained without intravenous contrast.  Angiographic images of the head were obtained using MRA technique without contrast. COMPARISON:  Head CT without contrast 03/16/2015. Brain MRI and MRA 08/16/2007. FINDINGS: MRI HEAD FINDINGS Scattered small gyriform foci of abnormal trace diffusion signal in the left parietal lobe (series 100, image 30), mildly confluent in some areas. Several superimposed areas of abnormal trace diffusion in the lateral left occipital lobe (images 19 and 20). Superimposed solitary area of abnormal trace diffusion in the right motor strip near the left upper extremity representation on image 37. Most of these appear isointense or at most mildly restricted on ADC. No other right hemisphere diffusion abnormality. Posterior fossa is spared. Major intracranial vascular flow voids are stable. Chronic right PCA territory encephalomalacia with hemosiderin has not significantly changed since 2009. Scattered small chronic micro hemorrhages also in the posterior right MCA territory appear stable. Chronic lacunar infarcts in the deep white matter capsules and to a lesser extent via medial thalami I have not significantly changed. Patchy and confluent cerebral white matter T2 and FLAIR hyperintensity elsewhere has mildly progressed. No other cortical encephalomalacia identified. The brainstem and cerebellum remain normal for age. No midline shift, mass effect, evidence of mass lesion, ventriculomegaly, extra-axial collection or acute intracranial hemorrhage. Cervicomedullary junction and pituitary are within normal limits. Visible internal auditory structures appear normal. Progressed C2-C3 spinal degeneration now with mild spinal stenosis. Stable bone marrow signal. No acute orbit or scalp soft tissue findings. Negative paranasal sinuses. MRA HEAD FINDINGS Stable antegrade flow in the posterior circulation. Stable distal vertebral arteries, the left appears dominant an the right functionally terminates in PICA. Stable basilar artery without  stenosis. SCA and PCA origins are within normal limits. Right posterior communicating artery remains normal. The left is diminutive as before. Bilateral PCA branches are stable with moderate irregularity and stenosis in the right P2 segment. Antegrade flow in both ICA siphons is stable. Bilateral siphon atherosclerosis and irregularity without siphon stenosis identified. There is a small chronic distal right cavernous siphon 3 mm aneurysm suspected (series 3, image 72) directed inferiorly and laterally. This has not significantly changed. Ophthalmic and right posterior communicating artery origins remain normal. Stable carotid termini, diminutive or absent left ACA A1 segment. Normal right ACA origin an anterior communicating artery. Visualized ACA branches remain within normal limits. Right MCA origin, M1 segment, and right MCA branches are stable. Left MCA origin remains normal. Left M1 segment, bifurcation, and visualized left MCA branches are stable. No left MCA branch occlusion identified. IMPRESSION: 1. Patchy acute to subacute posterior left MCA infarcts with no hemorrhage or mass effect. Several small subacute appearing lacunar infarcts also in the left PCA and right posterior MCA territory. 2. Chronic right PCA infarct with encephalomalacia and hemosiderin. Chronic micro hemorrhages in the posterior right MCA territory. Chronic lacunar infarcts in the deep white matter capsules and thalami. Mild progression of white matter disease since 2009. 3. Stable intracranial MRA since 2009. 4. 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 Chronic ICA siphon atherosclerosis without significant stenosis. 5. Chronic right PCA stenosis. Electronically Signed   By: Odessa Fleming M.D.   On: 03/18/2015 09:25    Medications:  Prior to Admission:  Prescriptions prior to admission  Medication Sig Dispense Refill Last Dose  . amLODipine (NORVASC) 10 MG tablet Take 10 mg by mouth  every morning.    unknown  . atorvastatin (LIPITOR) 20 MG tablet Take 20 mg by mouth every morning.    unknown  . calcium carbonate (TUMS EX) 750 MG chewable tablet Chew 1 tablet by mouth 2 (two) times daily.    unknown  . clopidogrel (PLAVIX) 75 MG tablet Take 75 mg by mouth every morning.    unknown  . losartan-hydrochlorothiazide (HYZAAR) 100-12.5 MG per tablet Take 1 tablet by mouth every morning.    unknown  . Methylcellulose, Laxative, (CITRUCEL) 500 MG TABS Take 1 tablet by mouth daily.    unknown  . metoprolol (LOPRESSOR) 50 MG tablet Take 50 mg by mouth 2 (two) times daily.    unknown  . Multiple Vitamin (MULTIVITAMIN WITH MINERALS) TABS Take 1 tablet by mouth every morning. *Contains NO Iron (Ferrous Sulfate and/or Iodine)*   unknown  . omeprazole (PRILOSEC) 20 MG capsule Take 20 mg by mouth every morning.    unknown  . trimethoprim (TRIMPEX) 100 MG tablet Take 100 mg by mouth at bedtime.   unknown  . vitamin C (ASCORBIC ACID) 500 MG tablet Take 500 mg by mouth every morning.    unknown  . cephALEXin (KEFLEX) 500 MG capsule Take 1 capsule (500 mg total) by mouth 4 (four) times daily. (Patient not taking: Reported on 03/17/2015) 28 capsule 0 Completed Course at Unknown time   Scheduled: . amLODipine  10 mg Oral BH-q7a  . aspirin  81 mg Oral Daily  . atorvastatin  20 mg Oral BH-q7a  . calcium carbonate  1-2 tablet Oral BID  . clopidogrel  75 mg Oral BH-q7a  . enoxaparin (LOVENOX) injection  40 mg Subcutaneous Q24H  . insulin aspart  0-9 Units Subcutaneous 6 times per day  . metoprolol  25 mg Oral BID  . pantoprazole  40 mg Oral Daily  . sodium chloride  3 mL Intravenous Q12H  . vitamin C  500 mg Oral BH-q7a   Continuous:  [id number] **OR** acetaminophen, alum & mag hydroxide-simeth, HYDROmorphone (DILAUDID) injection, ondansetron **OR** ondansetron (ZOFRAN) IV, oxyCODONE  Assesment: She was admitted with acute encephalopathy related to stroke. This may be cardioembolic.  Workup is underway. 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 Principal Problem:   Acute encephalopathy Active Problems:   DM type 2 (diabetes mellitus, type 2) ([place])   Urinary retention   AKI (acute kidney injury) ([place])   Coronary artery disease   Essential hypertension   Sinus bradycardia    Plan: Workup for stroke. Continue other treatments.      Hason Ofarrell L 03/19/2015, 9:09 AM

## 2015-03-20 LAB — GLUCOSE, CAPILLARY
GLUCOSE-CAPILLARY: 95 mg/dL (ref 65–99)
Glucose-Capillary: 96 mg/dL (ref 65–99)
Glucose-Capillary: 88 mg/dL (ref 65–99)

## 2015-03-20 MED ORDER — ATORVASTATIN CALCIUM 80 MG PO TABS: 80.0000 mg | ORAL_TABLET | ORAL | Status: DC

## 2015-03-20 NOTE — Care Management Important Message (Signed)
Important Message  Patient Details  Name: Caroline Aguirre MRN: [id number] Date of Birth: 08-Nov-1924   Medicare Important Message Given:  Yes    Joylene Draft, RN 03/20/2015, 10:37 AM

## 2015-03-20 NOTE — Discharge Summary (Signed)
Physician Discharge Summary  Patient ID: Caroline Aguirre MRN: [id number] DOB/AGE: 11-27-1924 81 y.o. Primary Care Physician:Motty Borin L, MD Admit date: 03/16/2015 Discharge date: 03/20/2015    Discharge Diagnoses:   Principal Problem:   Acute encephalopathy Active Problems:   Stroke [facility])   DM type 2 (diabetes mellitus, type 2) ([place])   Urinary retention   AKI (acute kidney injury) ([place])   Coronary artery disease   Essential hypertension   Sinus bradycardia     Medication List    TAKE these medications        amLODipine 10 MG tablet  Commonly known as:  NORVASC  Take 10 mg by mouth every morning.     atorvastatin 80 MG tablet  Commonly known as:  LIPITOR  Take 1 tablet (80 mg total) by mouth every morning.     calcium carbonate 750 MG chewable tablet  Commonly known as:  TUMS EX  Chew 1 tablet by mouth 2 (two) times daily.     cephALEXin 500 MG capsule  Commonly known as:  KEFLEX  Take 1 capsule (500 mg total) by mouth 4 (four) times daily.     CITRUCEL 500 MG Tabs  Generic drug:  Methylcellulose (Laxative)  Take 1 tablet by mouth daily.     clopidogrel 75 MG tablet  Commonly known as:  PLAVIX  Take 75 mg by mouth every morning.     losartan-hydrochlorothiazide 100-12.5 MG tablet  Commonly known as:  HYZAAR  Take 1 tablet by mouth every morning.     metoprolol 50 MG tablet  Commonly known as:  LOPRESSOR  Take 50 mg by mouth 2 (two) times daily.     multivitamin with minerals Tabs tablet  Take 1 tablet by mouth every morning. *Contains NO Iron (Ferrous Sulfate and/or Iodine)*     omeprazole 20 MG capsule  Commonly known as:  PRILOSEC  Take 20 mg by mouth every morning.     trimethoprim 100 MG tablet  Commonly known as:  TRIMPEX  Take 100 mg by mouth at bedtime.     vitamin C 500 MG tablet  Commonly known as:  ASCORBIC ACID  Take 500 mg by mouth every morning.        Discharged Condition: Improved    Consults:  Neurology  Significant Diagnostic Studies: Ct Head Wo Contrast  03/16/2015  CLINICAL DATA:  Two fell today.  Altered mental status EXAM: CT HEAD WITHOUT CONTRAST TECHNIQUE: Contiguous axial images were obtained from the base of the skull through the vertex without intravenous contrast. COMPARISON:  Brain MRI 08/16/2007 FINDINGS: There is gyriform high density cortical parenchyma in the posterior parietal temporal lobe (Image 16-22 of series 20 which is felt to relate relate to a prior hemorrhagic infarction described on MRI of 08/16/2007. No evidence of acute intracranial hemorrhage. No extra-axial fluid collections. Visualized cortical atrophy. Extensive periventricular subcortical white matter hypodensities. No evidence skull fracture.  Basilar cisterns are patent. None sinuses are clear. IMPRESSION: 1. High-density gyriform cortex in the posterior medial parietal lobe is felt to relate to remote hemorrhagic infarction and is not favored to represent acute hemorrhage. 2. Cortical atrophy and extensive white matter microvascular disease similar prior. 3. No acute intracranial findings. Electronically Signed   By: Suzy Bouchard M.D.   On: 03/16/2015 21:30   Mr Jodene Nam Head Wo Contrast  03/18/2015  CLINICAL DATA:  81 year old female with altered mental status and confusion for 1 week. Recent fall. Initial encounter. EXAM: MRI HEAD WITHOUT CONTRAST MRA HEAD  WITHOUT CONTRAST TECHNIQUE: Multiplanar, multiecho pulse sequences of the brain and surrounding structures were obtained without intravenous contrast. Angiographic images of the head were obtained using MRA technique without contrast. COMPARISON:  Head CT without contrast 03/16/2015. Brain MRI and MRA 08/16/2007. FINDINGS: MRI HEAD FINDINGS Scattered small gyriform foci of abnormal trace diffusion signal in the left parietal lobe (series 100, image 30), mildly confluent in some areas. Several superimposed areas of abnormal trace diffusion in the lateral  left occipital lobe (images 19 and 20). Superimposed solitary area of abnormal trace diffusion in the right motor strip near the left upper extremity representation on image 37. Most of these appear isointense or at most mildly restricted on ADC. No other right hemisphere diffusion abnormality. Posterior fossa is spared. Major intracranial vascular flow voids are stable. Chronic right PCA territory encephalomalacia with hemosiderin has not significantly changed since 2009. Scattered small chronic micro hemorrhages also in the posterior right MCA territory appear stable. Chronic lacunar infarcts in the deep white matter capsules and to a lesser extent via medial thalami I have not significantly changed. Patchy and confluent cerebral white matter T2 and FLAIR hyperintensity elsewhere has mildly progressed. No other cortical encephalomalacia identified. The brainstem and cerebellum remain normal for age. No midline shift, mass effect, evidence of mass lesion, ventriculomegaly, extra-axial collection or acute intracranial hemorrhage. Cervicomedullary junction and pituitary are within normal limits. Visible internal auditory structures appear normal. Progressed C2-C3 spinal degeneration now with mild spinal stenosis. Stable bone marrow signal. No acute orbit or scalp soft tissue findings. Negative paranasal sinuses. MRA HEAD FINDINGS Stable antegrade flow in the posterior circulation. Stable distal vertebral arteries, the left appears dominant an the right functionally terminates in PICA. Stable basilar artery without stenosis. SCA and PCA origins are within normal limits. Right posterior communicating artery remains normal. The left is diminutive as before. Bilateral PCA branches are stable with moderate irregularity and stenosis in the right P2 segment. Antegrade flow in both ICA siphons is stable. Bilateral siphon atherosclerosis and irregularity without siphon stenosis identified. There is a small chronic distal  right cavernous siphon 3 mm aneurysm suspected (series 3, image 72) directed inferiorly and laterally. This has not significantly changed. Ophthalmic and right posterior communicating artery origins remain normal. Stable carotid termini, diminutive or absent left ACA A1 segment. Normal right ACA origin an anterior communicating artery. Visualized ACA branches remain within normal limits. Right MCA origin, M1 segment, and right MCA branches are stable. Left MCA origin remains normal. Left M1 segment, bifurcation, and visualized left MCA branches are stable. No left MCA branch occlusion identified. IMPRESSION: 1. Patchy acute to subacute posterior left MCA infarcts with no hemorrhage or mass effect. Several small subacute appearing lacunar infarcts also in the left PCA and right posterior MCA territory. 2. Chronic right PCA infarct with encephalomalacia and hemosiderin. Chronic micro hemorrhages in the posterior right MCA territory. Chronic lacunar infarcts in the deep white matter capsules and thalami. Mild progression of white matter disease since 2009. 3. Stable intracranial MRA since 2009. 4. 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 Chronic ICA siphon atherosclerosis without significant stenosis. 5. Chronic right PCA stenosis. Electronically Signed   By: Genevie Ann M.D.   On: 03/18/2015 09:25   Mr Brain Wo Contrast  03/18/2015  CLINICAL DATA:  81 year old female with altered mental status and confusion for 1 week. Recent fall. Initial encounter. EXAM: MRI HEAD WITHOUT CONTRAST MRA HEAD WITHOUT CONTRAST TECHNIQUE: Multiplanar, multiecho  pulse sequences of the brain and surrounding structures were obtained without intravenous contrast. Angiographic images of the head were obtained using MRA technique without contrast. COMPARISON:  Head CT without contrast 03/16/2015. Brain MRI and MRA 08/16/2007. FINDINGS: MRI HEAD FINDINGS Scattered small gyriform foci of abnormal  trace diffusion signal in the left parietal lobe (series 100, image 30), mildly confluent in some areas. Several superimposed areas of abnormal trace diffusion in the lateral left occipital lobe (images 19 and 20). Superimposed solitary area of abnormal trace diffusion in the right motor strip near the left upper extremity representation on image 37. Most of these appear isointense or at most mildly restricted on ADC. No other right hemisphere diffusion abnormality. Posterior fossa is spared. Major intracranial vascular flow voids are stable. Chronic right PCA territory encephalomalacia with hemosiderin has not significantly changed since 2009. Scattered small chronic micro hemorrhages also in the posterior right MCA territory appear stable. Chronic lacunar infarcts in the deep white matter capsules and to a lesser extent via medial thalami I have not significantly changed. Patchy and confluent cerebral white matter T2 and FLAIR hyperintensity elsewhere has mildly progressed. No other cortical encephalomalacia identified. The brainstem and cerebellum remain normal for age. No midline shift, mass effect, evidence of mass lesion, ventriculomegaly, extra-axial collection or acute intracranial hemorrhage. Cervicomedullary junction and pituitary are within normal limits. Visible internal auditory structures appear normal. Progressed C2-C3 spinal degeneration now with mild spinal stenosis. Stable bone marrow signal. No acute orbit or scalp soft tissue findings. Negative paranasal sinuses. MRA HEAD FINDINGS Stable antegrade flow in the posterior circulation. Stable distal vertebral arteries, the left appears dominant an the right functionally terminates in PICA. Stable basilar artery without stenosis. SCA and PCA origins are within normal limits. Right posterior communicating artery remains normal. The left is diminutive as before. Bilateral PCA branches are stable with moderate irregularity and stenosis in the right P2  segment. Antegrade flow in both ICA siphons is stable. Bilateral siphon atherosclerosis and irregularity without siphon stenosis identified. There is a small chronic distal right cavernous siphon 3 mm aneurysm suspected (series 3, image 72) directed inferiorly and laterally. This has not significantly changed. Ophthalmic and right posterior communicating artery origins remain normal. Stable carotid termini, diminutive or absent left ACA A1 segment. Normal right ACA origin an anterior communicating artery. Visualized ACA branches remain within normal limits. Right MCA origin, M1 segment, and right MCA branches are stable. Left MCA origin remains normal. Left M1 segment, bifurcation, and visualized left MCA branches are stable. No left MCA branch occlusion identified. IMPRESSION: 1. Patchy acute to subacute posterior left MCA infarcts with no hemorrhage or mass effect. Several small subacute appearing lacunar infarcts also in the left PCA and right posterior MCA territory. 2. Chronic right PCA infarct with encephalomalacia and hemosiderin. Chronic micro hemorrhages in the posterior right MCA territory. Chronic lacunar infarcts in the deep white matter capsules and thalami. Mild progression of white matter disease since 2009. 3. Stable intracranial MRA since 2009. 4. 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 Chronic ICA siphon atherosclerosis without significant stenosis. 5. Chronic right PCA stenosis. Electronically Signed   By: Genevie Ann M.D.   On: 03/18/2015 09:25    Lab Results: Basic Metabolic Panel:  Recent Labs  03/18/15 0624  NA 142  K 3.8  CL 109  CO2 26  GLUCOSE 104*  BUN 18  CREATININE 0.94  CALCIUM 9.0   Liver Function Tests: No  results for input(s): AST, ALT, ALKPHOS, BILITOT, PROT, ALBUMIN in the last 72 hours.   CBC:  Recent Labs  03/18/15 0624  WBC 9.6  HGB 11.6*  HCT 34.5*  MCV 85.4  PLT 209    Recent Results (from the  past 240 hour(s))  Urine culture     Status: None   Collection Time: 03/16/15  9:50 PM  Result Value Ref Range Status   Specimen Description URINE, CLEAN CATCH  Final   Special Requests NONE  Final   Culture   Final    MULTIPLE SPECIES PRESENT, SUGGEST RECOLLECTION Performed at [facility]    Report Status 03/18/2015 FINAL  Final     Hospital Course: This is an 81 year old who had a previous stroke and came to the hospital because of confusion. CT did not show a stroke but her MRI showed that she had a stroke in the distribution of the left middle cerebral artery. Her confusion improved although she was still a little confused and required a safety sitter to keep her from falling but she improved to the point that she was ready for discharge. I think her mental status will likely improve when she gets home. She is going to have home health services with PT and RN.  Discharge Exam: Blood pressure 136/42, pulse 61, temperature 98.4 F (36.9 C), temperature source Oral, resp. rate 16, height 5\' 3"  (1.6 m), weight 50.7 kg (111 lb 12.4 oz), SpO2 97 %. She is awake and alert. Her chest is clear.  Disposition: Home with home health services      Discharge Instructions    Discharge patient    Complete by:  As directed      Face-to-face encounter (required for Medicare/Medicaid patients)    Complete by:  As directed   I Markie Heffernan L certify that this patient is under my care and that I, or a nurse practitioner or physician's assistant working with me, had a face-to-face encounter that meets the physician face-to-face encounter requirements with this patient on 03/20/2015. The encounter with the patient was in whole, or in part for the following medical condition(s) which is the primary reason for home health care (List medical condition): Stroke  The encounter with the patient was in whole, or in part, for the following medical condition, which is the primary reason for home health  care:  Stroke  I certify that, based on my findings, the following services are medically necessary home health services:   Nursing Physical therapy    Reason for Medically Necessary Home Health Services:  Skilled Nursing- Change/Decline in Patient Status  My clinical findings support the need for the above services:  Unable to leave home safely without assistance and/or assistive device  Further, I certify that my clinical findings support that this patient is homebound due to:  Unable to leave home safely without assistance     Home Health    Complete by:  As directed   To provide the following care/treatments:   PT RN               Signed: Leone Mobley L   03/20/2015, 9:38 AM

## 2015-03-20 NOTE — Care Management Note (Signed)
Case Management Note  Patient Details  Name: Caroline Aguirre MRN: [id number] Date of Birth: 06-05-1924  Subjective/Objective:                    Action/Plan:   Expected Discharge Date:                  Expected Discharge Plan:  [place]  In-House Referral:  NA  Discharge planning Services  CM Consult  Post Acute Care Choice:  Home Health Choice offered to:  Patient  DME Arranged:    DME Agency:     HH Arranged:  RN, PT, Speech Therapy HH Agency:  [place]  Status of Service:  Completed, signed off  Medicare Important Message Given:  Yes Date Medicare IM Given:    Medicare IM give by:    Date Additional Medicare IM Given:    Additional Medicare Important Message give by:     If discussed at [place] of Stay Meetings, dates discussed:    Additional Comments: Pt discharged home today with [facility] RN, PT, and ST (per pts choice). Romualdo Bolk of [facility] is aware and will collect the pts information from the chart. [place] services to start within 48 hours of discharge. No DME needs noted. Pt and pts nurse aware of discharge arrangements. Christinia Gully [place], RN 03/20/2015, 11:03 AM

## 2015-03-20 NOTE — Progress Notes (Signed)
She feels better and wants to go home. PTOT and speech consultations as well as neurology consultation are noted and appreciated. Her exam shows that she is back to baseline. I think she is ready for discharge. She will be discharged home today

## 2015-03-21 DIAGNOSIS — Z7902 Long term (current) use of antithrombotics/antiplatelets: Secondary | ICD-10-CM | POA: Diagnosis not present

## 2015-03-21 DIAGNOSIS — H409 Unspecified glaucoma: Secondary | ICD-10-CM | POA: Diagnosis not present

## 2015-03-21 DIAGNOSIS — Z9181 History of falling: Secondary | ICD-10-CM | POA: Diagnosis not present

## 2015-03-21 DIAGNOSIS — I69320 Aphasia following cerebral infarction: Secondary | ICD-10-CM | POA: Diagnosis not present

## 2015-03-21 DIAGNOSIS — I10 Essential (primary) hypertension: Secondary | ICD-10-CM | POA: Diagnosis not present

## 2015-03-21 DIAGNOSIS — H9190 Unspecified hearing loss, unspecified ear: Secondary | ICD-10-CM | POA: Diagnosis not present

## 2015-03-21 DIAGNOSIS — Z853 Personal history of malignant neoplasm of breast: Secondary | ICD-10-CM | POA: Diagnosis not present

## 2015-03-21 DIAGNOSIS — E119 Type 2 diabetes mellitus without complications: Secondary | ICD-10-CM | POA: Diagnosis not present

## 2015-03-21 DIAGNOSIS — R32 Unspecified urinary incontinence: Secondary | ICD-10-CM | POA: Diagnosis not present

## 2015-03-21 DIAGNOSIS — M1991 Primary osteoarthritis, unspecified site: Secondary | ICD-10-CM | POA: Diagnosis not present

## 2015-03-21 DIAGNOSIS — R339 Retention of urine, unspecified: Secondary | ICD-10-CM | POA: Diagnosis not present

## 2015-03-21 DIAGNOSIS — I2510 Atherosclerotic heart disease of native coronary artery without angina pectoris: Secondary | ICD-10-CM | POA: Diagnosis not present

## 2015-03-21 DIAGNOSIS — K219 Gastro-esophageal reflux disease without esophagitis: Secondary | ICD-10-CM | POA: Diagnosis not present

## 2015-05-23 ENCOUNTER — Ambulatory Visit (INDEPENDENT_AMBULATORY_CARE_PROVIDER_SITE_OTHER): Payer: Medicare Other | Admitting: Urology

## 2015-05-23 DIAGNOSIS — N3946 Mixed incontinence: Secondary | ICD-10-CM | POA: Diagnosis not present

## 2015-05-23 DIAGNOSIS — N3020 Other chronic cystitis without hematuria: Secondary | ICD-10-CM

## 2015-05-23 DIAGNOSIS — N390 Urinary tract infection, site not specified: Secondary | ICD-10-CM | POA: Diagnosis not present

## 2015-06-03 DIAGNOSIS — R21 Rash and other nonspecific skin eruption: Secondary | ICD-10-CM | POA: Diagnosis not present

## 2015-06-03 DIAGNOSIS — I10 Essential (primary) hypertension: Secondary | ICD-10-CM | POA: Diagnosis not present

## 2015-06-03 DIAGNOSIS — R040 Epistaxis: Secondary | ICD-10-CM | POA: Diagnosis not present

## 2015-06-16 NOTE — Progress Notes (Signed)
NO SHOW

## 2015-06-16 NOTE — Assessment & Plan Note (Deleted)
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 It was estrogen receptor-negative, progesterone receptor-negative, and she underwent mastectomy by Dr. Margot Chimes on 09/25/2010.  Up-to-date on screening mammogram with next one due in September 2017.  Chart is reviewed and hospitalization in December 2016 is noted with MRI brain findings suspicious for acute on subacute infarct(s).  No role for labs today.  Labs in 6 months: CBC diff, CMET, Vit D  Return in 6 months for follow-up following mammogram.

## 2015-06-17 ENCOUNTER — Ambulatory Visit (HOSPITAL_COMMUNITY): Payer: Medicare Other | Admitting: Oncology

## 2015-07-24 DIAGNOSIS — R21 Rash and other nonspecific skin eruption: Secondary | ICD-10-CM | POA: Diagnosis not present

## 2015-07-24 DIAGNOSIS — E0580 Other thyrotoxicosis without thyrotoxic crisis or storm: Secondary | ICD-10-CM | POA: Diagnosis not present

## 2015-07-24 DIAGNOSIS — I2510 Atherosclerotic heart disease of native coronary artery without angina pectoris: Secondary | ICD-10-CM | POA: Diagnosis not present

## 2015-07-24 DIAGNOSIS — E46 Unspecified protein-calorie malnutrition: Secondary | ICD-10-CM | POA: Diagnosis not present

## 2015-08-14 DIAGNOSIS — R21 Rash and other nonspecific skin eruption: Secondary | ICD-10-CM | POA: Diagnosis not present

## 2015-08-22 ENCOUNTER — Other Ambulatory Visit (HOSPITAL_COMMUNITY)
Admission: RE | Admit: 2015-08-22 | Discharge: 2015-08-22 | Disposition: A | Discharge status: Home / Self Care | Payer: Medicare Other | Source: Other Acute Inpatient Hospital | Attending: Urology | Admitting: Urology

## 2015-08-22 ENCOUNTER — Ambulatory Visit (INDEPENDENT_AMBULATORY_CARE_PROVIDER_SITE_OTHER): Payer: Medicare Other | Admitting: Urology

## 2015-08-22 DIAGNOSIS — R351 Nocturia: Secondary | ICD-10-CM | POA: Diagnosis not present

## 2015-08-22 DIAGNOSIS — N3020 Other chronic cystitis without hematuria: Secondary | ICD-10-CM | POA: Diagnosis not present

## 2015-08-22 DIAGNOSIS — N3946 Mixed incontinence: Secondary | ICD-10-CM | POA: Diagnosis not present

## 2015-08-22 LAB — URINALYSIS, ROUTINE W REFLEX MICROSCOPIC
BILIRUBIN URINE: NEGATIVE
Glucose, UA: NEGATIVE mg/dL
HGB URINE DIPSTICK: NEGATIVE
Ketones, ur: NEGATIVE mg/dL
Nitrite: NEGATIVE
PH: 5.5 (ref 5.0–8.0)
Protein, ur: NEGATIVE mg/dL

## 2015-08-22 LAB — URINE MICROSCOPIC-ADD ON
Bacteria, UA: NONE SEEN
RBC / HPF: NONE SEEN RBC/hpf (ref 0–5)
SQUAMOUS EPITHELIAL / LPF: NONE SEEN

## 2015-08-23 LAB — URINE CULTURE

## 2015-09-03 ENCOUNTER — Encounter (HOSPITAL_COMMUNITY): Payer: Self-pay | Admitting: Emergency Medicine

## 2015-09-03 ENCOUNTER — Emergency Department (HOSPITAL_COMMUNITY): Payer: Medicare Other

## 2015-09-03 ENCOUNTER — Emergency Department (HOSPITAL_COMMUNITY)
Admission: EM | Admit: 2015-09-03 | Discharge: 2015-09-03 | Disposition: A | Discharge status: Home / Self Care | Payer: Medicare Other | Attending: Emergency Medicine | Admitting: Emergency Medicine

## 2015-09-03 DIAGNOSIS — I2510 Atherosclerotic heart disease of native coronary artery without angina pectoris: Secondary | ICD-10-CM | POA: Diagnosis not present

## 2015-09-03 DIAGNOSIS — M1990 Unspecified osteoarthritis, unspecified site: Secondary | ICD-10-CM | POA: Diagnosis not present

## 2015-09-03 DIAGNOSIS — Z853 Personal history of malignant neoplasm of breast: Secondary | ICD-10-CM | POA: Insufficient documentation

## 2015-09-03 DIAGNOSIS — I129 Hypertensive chronic kidney disease with stage 1 through stage 4 chronic kidney disease, or unspecified chronic kidney disease: Secondary | ICD-10-CM | POA: Insufficient documentation

## 2015-09-03 DIAGNOSIS — R0602 Shortness of breath: Secondary | ICD-10-CM | POA: Diagnosis not present

## 2015-09-03 DIAGNOSIS — Z8673 Personal history of transient ischemic attack (TIA), and cerebral infarction without residual deficits: Secondary | ICD-10-CM | POA: Diagnosis not present

## 2015-09-03 DIAGNOSIS — E1122 Type 2 diabetes mellitus with diabetic chronic kidney disease: Secondary | ICD-10-CM | POA: Insufficient documentation

## 2015-09-03 DIAGNOSIS — I4891 Unspecified atrial fibrillation: Secondary | ICD-10-CM | POA: Insufficient documentation

## 2015-09-03 DIAGNOSIS — N189 Chronic kidney disease, unspecified: Secondary | ICD-10-CM | POA: Diagnosis not present

## 2015-09-03 DIAGNOSIS — Z79899 Other long term (current) drug therapy: Secondary | ICD-10-CM | POA: Diagnosis not present

## 2015-09-03 DIAGNOSIS — M7989 Other specified soft tissue disorders: Secondary | ICD-10-CM | POA: Diagnosis present

## 2015-09-03 DIAGNOSIS — R600 Localized edema: Secondary | ICD-10-CM

## 2015-09-03 LAB — BASIC METABOLIC PANEL
ANION GAP: 6 (ref 5–15)
BUN: 23 mg/dL — ABNORMAL HIGH (ref 6–20)
CALCIUM: 9.2 mg/dL (ref 8.9–10.3)
CO2: 26 mmol/L (ref 22–32)
CREATININE: 1.32 mg/dL — AB (ref 0.44–1.00)
Chloride: 105 mmol/L (ref 101–111)
GFR, EST AFRICAN AMERICAN: 40 mL/min — AB (ref >60)
GFR, EST NON AFRICAN AMERICAN: 35 mL/min — AB (ref >60)
Glucose, Bld: 121 mg/dL — ABNORMAL HIGH (ref 65–99)
Potassium: 3.7 mmol/L (ref 3.5–5.1)
SODIUM: 137 mmol/L (ref 135–145)

## 2015-09-03 LAB — CBC WITH DIFFERENTIAL/PLATELET
BASOS ABS: 0 10*3/uL (ref 0.0–0.1)
BASOS PCT: 0 %
EOS ABS: 0.2 10*3/uL (ref 0.0–0.7)
Eosinophils Relative: 3 %
HCT: 38 % (ref 36.0–46.0)
Hemoglobin: 12.5 g/dL (ref 12.0–15.0)
Lymphocytes Relative: 17 %
Lymphs Abs: 1 10*3/uL (ref 0.7–4.0)
MCH: 28.5 pg (ref 26.0–34.0)
MCHC: 32.9 g/dL (ref 30.0–36.0)
MCV: 86.6 fL (ref 78.0–100.0)
MONO ABS: 0.6 10*3/uL (ref 0.1–1.0)
MONOS PCT: 10 %
NEUTROS PCT: 70 %
Neutro Abs: 4.2 10*3/uL (ref 1.7–7.7)
PLATELETS: 274 10*3/uL (ref 150–400)
RBC: 4.39 MIL/uL (ref 3.87–5.11)
RDW: 14.1 % (ref 11.5–15.5)
WBC: 5.9 10*3/uL (ref 4.0–10.5)

## 2015-09-03 LAB — TROPONIN I

## 2015-09-03 LAB — BRAIN NATRIURETIC PEPTIDE: B NATRIURETIC PEPTIDE 5: 312 pg/mL — AB (ref 0.0–100.0)

## 2015-09-03 MED ORDER — APIXABAN 2.5 MG PO TABS: 2.5000 mg | ORAL_TABLET | Freq: Two times a day (BID) | ORAL | Status: AC

## 2015-09-03 NOTE — ED Notes (Signed)
Pt made aware to return if symptoms worsen or if any life threatening symptoms occur.   

## 2015-09-03 NOTE — ED Notes (Signed)
Pt given instruction to see Dr. Johnsie Cancel tomorrow at 1330 in the heart care area.

## 2015-09-03 NOTE — ED Notes (Signed)
Pt c/o bilateral leg swelling, increase in fatigue, and dyspnea with exertion x 2 days. No PMH of CHF. Pt also reports lower back pain.

## 2015-09-03 NOTE — Discharge Instructions (Signed)
See cardiology tomorrow. If you were given medicines take as directed.  If you are on coumadin or contraceptives realize their levels and effectiveness is altered by many different medicines.  If you have any reaction (rash, tongues swelling, other) to the medicines stop taking and see a physician.    If your blood pressure was elevated in the ER make sure you follow up for management with a primary doctor or return for chest pain, shortness of breath or stroke symptoms.  Please follow up as directed and return to the ER or see a physician for new or worsening symptoms.  Thank you. Filed Vitals:   09/03/15 0816  BP: 139/62  Pulse: 70  Temp: 97.8 F (36.6 C)  TempSrc: Oral  Resp: 16  Height: 5\' 1"  (1.549 m)  Weight: 112 lb (50.803 kg)  SpO2: 97%    Atrial Fibrillation Atrial fibrillation is a type of heartbeat that is irregular or fast (rapid). If you have this condition, your heart keeps quivering in a weird (chaotic) way. This condition can make it so your heart cannot pump blood normally. Having this condition gives a person more risk for stroke, heart failure, and other heart problems. There are different types of atrial fibrillation. Talk with your doctor to learn about the type that you have. HOME CARE  Take over-the-counter and prescription medicines only as told by your doctor.  If your doctor prescribed a blood-thinning medicine, take it exactly as told. Taking too much of it can cause bleeding. If you do not take enough of it, you will not have the protection that you need against stroke and other problems.  Do not use any tobacco products. These include cigarettes, chewing tobacco, and e-cigarettes. If you need help quitting, ask your doctor.  If you have apnea (obstructive sleep apnea), manage it as told by your doctor.  Do not drink alcohol.  Do not drink beverages that have caffeine. These include coffee, soda, and tea.  Maintain a healthy weight. Do not use diet pills  unless your doctor says they are safe for you. Diet pills may make heart problems worse.  Follow diet instructions as told by your doctor.  Exercise regularly as told by your doctor.  Keep all follow-up visits as told by your doctor. This is important. GET HELP IF:  You notice a change in the speed, rhythm, or strength of your heartbeat.  You are taking a blood-thinning medicine and you notice more bruising.  You get tired more easily when you move or exercise. GET HELP RIGHT AWAY IF:  You have pain in your chest or your belly (abdomen).  You have sweating or weakness.  You feel sick to your stomach (nauseous).  You notice blood in your throw up (vomit), poop (stool), or pee (urine).  You are short of breath.  You suddenly have swollen feet and ankles.  You feel dizzy.  Your suddenly get weak or numb in your face, arms, or legs, especially if it happens on one side of your body.  You have trouble talking, trouble understanding, or both.  Your face or your eyelid droops on one side. These symptoms may be an emergency. Do not wait to see if the symptoms will go away. Get medical help right away. Call your local emergency services (911 in the [place] Do not drive yourself to the hospital.   This information is not intended to replace advice given to you by your health care provider. Make sure you discuss any questions  you have with your health care provider.   Document Released: 12/16/2007 Document Revised: 11/27/2014 Document Reviewed: 07/03/2014 Elsevier Interactive Patient Education [employer].

## 2015-09-03 NOTE — ED Provider Notes (Signed)
History  By signing my name below, I, Caroline Aguirre, attest that this documentation has been prepared under the direction and in the presence of Caroline Morrison, MD. Electronically Signed: Bea Aguirre, ED Scribe. 09/03/2015. 9:39 AM.  Chief Complaint  Patient presents with  . Leg Swelling   The history is provided by the patient, medical records and a relative. No language interpreter was used.    HPI Comments:  Caroline Aguirre is a 82 y.o. female with PMHx of right breast cancer, CAD, HTN and DM who presents to the Emergency Department complaining of SOB with exertion that began about two days ago. She also reports urinary incontinence that has been ongoing for some time and bilateral leg swelling that is new beginning the last couple of days. She states she is having to use her cane for assistance with walking more frequently now due to increased fatigue. Pt reports some dizziness and chills yesterday. Family reports pt started Oxybutynin about one week ago which is her only new medication. She has not done anything to treat her symptoms. Walking increases her SOB, but she denies alleviating factors of other symptoms. She denies CP, fever, weight gain, dysuria, HA. She denies PMHx of CHF, atrial fibrillation, kidney or liver disease. Pt lives with her son.  Past Medical History  Diagnosis Date  . Herniated disc   . Cyst of breast     Right - at 3 yrs of age.  . Shingles   . Hardening of the arteries of the brain   . Stroke [facility]) 2004    cerebral hemmorhage  . Diverticulitis   . Arthritis   . 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 Hx Breast cancer, DCIS, Right 08/19/2010  . Migraine headache   . SBO (small bowel obstruction) ([place]) 09/02/11  . Elevated cholesterol   . Heart disease   . GERD (gastroesophageal reflux disease)   . Coronary artery disease   . Hypertension   . Cancer ([place])     Rt Breast  . Diabetes mellitus without  complication [facility])    Past Surgical History  Procedure Laterality Date  . Brain surgery      hemorrhage  . Cystectomy    . Spine surgery      repair herniated disc  . Fibroid tumors    . Breast surgery  09/25/2010    mastectomy  . Knee arthroscopy Right     right knee x 3  . Laparoscopic bilateral salpingo oopherectomy     Family History  Problem Relation Age of Onset  . Cancer Mother   . Heart disease Father   . Spinal muscular atrophy Sister     spinal meningitis   Social History  Substance Use Topics  . Smoking status: Never Smoker   . Smokeless tobacco: Never Used  . Alcohol Use: No   OB History    No data available     Review of Systems  Constitutional: Positive for chills and fatigue.  Respiratory: Positive for shortness of breath.   Cardiovascular: Positive for leg swelling.  Neurological: Positive for dizziness.  All other systems reviewed and are negative.   Allergies  Tape; Ciprofloxacin; Sulfa antibiotics; Codeine; and Morphine and related  Home Medications   Prior to Admission medications   Medication Sig Start Date End Date Taking? Authorizing Provider  amLODipine (NORVASC) 10 MG tablet Take 10 mg by mouth every morning.    Yes Historical Provider,  MD  atorvastatin (LIPITOR) 80 MG tablet Take 1 tablet (80 mg total) by mouth every morning. 03/20/15  Yes Sinda Du, MD  clopidogrel (PLAVIX) 75 MG tablet Take 75 mg by mouth every morning.    Yes Historical Provider, MD  losartan-hydrochlorothiazide (HYZAAR) 100-12.5 MG per tablet Take 1 tablet by mouth every morning.    Yes Historical Provider, MD  metoprolol (LOPRESSOR) 50 MG tablet Take 50 mg by mouth 2 (two) times daily.    Yes Historical Provider, MD  omeprazole (PRILOSEC) 20 MG capsule Take 20 mg by mouth every morning.    Yes Historical Provider, MD  calcium carbonate (TUMS EX) 750 MG chewable tablet Chew 1 tablet by mouth 2 (two) times daily.     Historical Provider, MD  cephALEXin (KEFLEX)  500 MG capsule Take 1 capsule (500 mg total) by mouth 4 (four) times daily. Patient not taking: Reported on 03/17/2015 11/16/14   Evalee Jefferson, PA-C  Methylcellulose, Laxative, (CITRUCEL) 500 MG TABS Take 1 tablet by mouth daily.     Historical Provider, MD  Multiple Vitamin (MULTIVITAMIN WITH MINERALS) TABS Take 1 tablet by mouth every morning. *Contains NO Iron (Ferrous Sulfate and/or Iodine)*    Historical Provider, MD  trimethoprim (TRIMPEX) 100 MG tablet Take 100 mg by mouth at bedtime.    Historical Provider, MD  vitamin C (ASCORBIC ACID) 500 MG tablet Take 500 mg by mouth every morning.     Historical Provider, MD   Triage Vitals: BP 139/62 mmHg  Pulse 70  Temp(Src) 97.8 F (36.6 C) (Oral)  Resp 16  Ht 5\' 1"  (1.549 m)  Wt 112 lb (50.803 kg)  BMI 21.17 kg/m2  SpO2 97% Physical Exam  Constitutional: She is oriented to person, place, and time. She appears well-developed and well-nourished.  HENT:  Head: Normocephalic and atraumatic.  Eyes: EOM are normal.  Neck: Normal range of motion.  Cardiovascular: Normal rate and normal heart sounds.  An irregular rhythm present. Exam reveals no gallop and no friction rub.   No murmur heard. Pulmonary/Chest: Effort normal and breath sounds normal. No respiratory distress. She has no wheezes. She has no rales.  Abdominal: Soft. There is no tenderness.  Musculoskeletal: Normal range of motion. She exhibits edema.  2+ edema in BLE. Mild pitting.  Neurological: She is alert and oriented to person, place, and time. No cranial nerve deficit.  Finger to nose intact. No arm drift. Equal strength in BUE.  Skin: Skin is warm and dry.  Psychiatric: She has a normal mood and affect. Her behavior is normal.  Nursing note and vitals reviewed.   ED Course  Procedures (including critical care time) DIAGNOSTIC STUDIES: Oxygen Saturation is 97% on RA, normal by my interpretation.   COORDINATION OF CARE: 8:35 AM- Will order labs. Pt verbalizes  understanding and agrees to plan.  Medications - No data to display  Labs Review Labs Reviewed  BASIC METABOLIC PANEL - Abnormal; Notable for the following:    Glucose, Bld 121 (*)    BUN 23 (*)    Creatinine, Ser 1.32 (*)    GFR calc non Af Amer 35 (*)    GFR calc Af Amer 40 (*)    All other components within normal limits  BRAIN NATRIURETIC PEPTIDE - Abnormal; Notable for the following:    B Natriuretic Peptide 312.0 (*)    All other components within normal limits  CBC WITH DIFFERENTIAL/PLATELET  TROPONIN I    Imaging Review Dg Chest Portable 1 View  09/03/2015  CLINICAL DATA:  Shortness of breath with exertion. Pedal edema. History of breast carcinoma. EXAM: PORTABLE CHEST 1 VIEW COMPARISON:  September 21, 2010 FINDINGS: There is no edema or consolidation. Heart is borderline enlarged with pulmonary vascularity within normal limits. No adenopathy. No bone lesions. IMPRESSION: Heart borderline enlarged.  No edema or consolidation. Electronically Signed   By: Lowella Grip III M.D.   On: 09/03/2015 08:58   I have personally reviewed and evaluated these images and lab results as part of my medical decision-making.   EKG Interpretation   Date/Time:  Wednesday September 03 2015 08:26:00 EDT Ventricular Rate:  56 PR Interval:    QRS Duration: 107 QT Interval:  445 QTC Calculation: 429 R Axis:   61 Text Interpretation:  Atrial fibrillation Borderline low voltage,  extremity leads Confirmed by Ajay Strubel MD, Hodan Wurtz [phone]) on 09/03/2015  8:46:17 AM      MDM   Final diagnoses:  Bilateral leg edema  New onset atrial fibrillation ([place])   Patient presents with worsening leg swelling fatigue and shortness of breath with exertion for 2 day. No known history of atrial fibrillation or heart failure. Patient is on metoprolol.Patient has new onset atrial fibrillation in the ER. Concern for new onset CHF/diastolic from a fib.   CHADS VASC score 7. Unknown onset of a fib.  DIscussed with  cardiology for either admission vs close fup in the clinic.  Cardiology Dr Johnsie Cancel will see patient tomorrow at 1:30.  Pt well appearing on recheck.  Recommended starting low dose eliquis with kidney function until appointment.  Results and differential diagnosis were discussed with the patient/parent/guardian. Xrays were independently reviewed by myself.  Close follow up outpatient was discussed, comfortable with the plan.   Medications - No data to display  Filed Vitals:   09/03/15 0816  BP: 139/62  Pulse: 70  Temp: 97.8 F (36.6 C)  TempSrc: Oral  Resp: 16  Height: 5\' 1"  (1.549 m)  Weight: 112 lb (50.803 kg)  SpO2: 97%    Final diagnoses:  Bilateral leg edema  New onset atrial fibrillation ([facility])  CRF (chronic renal failure), unspecified stage          Caroline Morrison, MD 09/03/15 1032

## 2015-09-04 ENCOUNTER — Ambulatory Visit (INDEPENDENT_AMBULATORY_CARE_PROVIDER_SITE_OTHER): Payer: Medicare Other | Admitting: Cardiovascular Disease

## 2015-09-04 ENCOUNTER — Encounter: Payer: Self-pay | Admitting: Cardiovascular Disease

## 2015-09-04 VITALS — BP 126/58 | HR 70 | Ht 63.0 in | Wt 120.0 lb

## 2015-09-04 DIAGNOSIS — I482 Chronic atrial fibrillation, unspecified: Secondary | ICD-10-CM

## 2015-09-04 DIAGNOSIS — I4891 Unspecified atrial fibrillation: Secondary | ICD-10-CM | POA: Diagnosis not present

## 2015-09-04 MED ORDER — LOSARTAN POTASSIUM 50 MG PO TABS: 50.0000 mg | ORAL_TABLET | Freq: Every day | ORAL | Status: AC

## 2015-09-04 MED ORDER — FUROSEMIDE 20 MG PO TABS: 20.0000 mg | ORAL_TABLET | Freq: Every day | ORAL | Status: DC

## 2015-09-04 NOTE — Progress Notes (Signed)
Cardiology Office Note   Date:  09/04/2015   ID:  Caroline Aguirre, DOB 04/12/1924, MRN [id number]  PCP:  Alonza Bogus, MD  Cardiologist:   Jenkins Rouge, MD   No chief complaint on file.     History of Present Illness: Caroline Aguirre is a 82 y.o. female who presents for evaluation of new onset afib. Seen in ER 6/15 with increasing fatigue , dyspnea and LE edema over last couple of weeks. In ER BNP over 300 and found to be In afib .   CXR with borderline CE but no CHF or pneumonia.  Cr up a bit  Note 03/16/16  TSH suppressed at .01 She was seen in December [facility] for stroke confirmed by MRI.  Started on plavix no afib noted at that  Time ? No TEE/ILR at that time.   ------------------------------------   Lab Results  Component Value Date   CREATININE 1.32* 09/03/2015   BUN 23* 09/03/2015   NA 137 09/03/2015   K 3.7 09/03/2015   CL 105 09/03/2015   CO2 26 09/03/2015      Echo 02/2015   - Left ventricle: The cavity size was normal. Wall thickness was  normal. Systolic function was normal. The estimated ejection  fraction was in the range of 60% to 65%. Wall motion was normal;  there were no regional wall motion abnormalities. Features are  consistent with a pseudonormal left ventricular filling pattern,  with concomitant abnormal relaxation and increased filling  pressure (grade 2 diastolic dysfunction). Doppler parameters are  consistent with high ventricular filling pressure. - Aortic valve: Moderately calcified annulus. Trileaflet. There was  no stenosis. There was mild regurgitation. - Mitral valve: Calcified annulus. There was mild regurgitation. - Left atrium: The atrium was moderately dilated. - Right atrium: The atrium was moderately dilated. - Atrial septum: No defect or patent foramen ovale was identified. - Tricuspid valve: There was moderate regurgitation. - Pulmonary arteries: Systolic pressure was moderately increased.  PA peak pressure: 50  mm Hg (S).   Past Medical History  Diagnosis Date  . Herniated disc   . Cyst of breast     Right - at 5 yrs of age.  . Shingles   . Hardening of the arteries of the brain   . Stroke [facility]) 2004    cerebral hemmorhage  . Diverticulitis   . Arthritis   . 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 Hx Breast cancer, DCIS, Right 08/19/2010  . Migraine headache   . SBO (small bowel obstruction) (Caroline Aguirre) 09/02/11  . Elevated cholesterol   . Heart disease   . GERD (gastroesophageal reflux disease)   . Coronary artery disease   . Hypertension   . Cancer ([place])     Rt Breast  . Diabetes mellitus without complication [facility])     Past Surgical History  Procedure Laterality Date  . Brain surgery      hemorrhage  . Cystectomy    . Spine surgery      repair herniated disc  . Fibroid tumors    . Breast surgery  09/25/2010    mastectomy  . Knee arthroscopy Right     right knee x 3  . Laparoscopic bilateral salpingo oopherectomy       Current Outpatient Prescriptions  Medication Sig Dispense Refill  . acetaminophen (TYLENOL) 325 MG tablet Take 325 mg by mouth daily as needed for moderate pain.    Marland Kitchen  amLODipine (NORVASC) 10 MG tablet Take 10 mg by mouth every morning.     Marland Kitchen apixaban (ELIQUIS) 2.5 MG TABS tablet Take 1 tablet (2.5 mg total) by mouth 2 (two) times daily. 60 tablet 0  . atorvastatin (LIPITOR) 80 MG tablet Take 1 tablet (80 mg total) by mouth every morning. 30 tablet 12  . calcium carbonate (TUMS EX) 750 MG chewable tablet Chew 2 tablets by mouth daily.     . clopidogrel (PLAVIX) 75 MG tablet Take 75 mg by mouth every morning.     Marland Kitchen losartan-hydrochlorothiazide (HYZAAR) 100-12.5 MG per tablet Take 1 tablet by mouth every morning.     . Methylcellulose, Laxative, (CITRUCEL) 500 MG TABS Take 1 tablet by mouth daily.     . metoprolol (LOPRESSOR) 50 MG tablet Take 50 mg by mouth 2 (two) times daily.     . Multiple Vitamin (MULTIVITAMIN WITH  MINERALS) TABS Take 1 tablet by mouth every morning. *Contains NO Iron (Ferrous Sulfate and/or Iodine)*    . omeprazole (PRILOSEC) 20 MG capsule Take 20 mg by mouth every morning.     Marland Kitchen oxybutynin (DITROPAN) 5 MG tablet Take 0.5-1 tablets by mouth 2 (two) times daily. May increase 1 tablet 3 times daily if needed.    . trimethoprim (TRIMPEX) 100 MG tablet Take 100 mg by mouth at bedtime.    . vitamin C (ASCORBIC ACID) 500 MG tablet Take 500 mg by mouth every morning.      No current facility-administered medications for this visit.    Allergies:   Tape; Ciprofloxacin; Sulfa antibiotics; Codeine; and Morphine and related    Social History:  The patient  reports that she has never smoked. She has never used smokeless tobacco. She reports that she does not drink alcohol or use illicit drugs.   Family History:  The patient's family history includes Cancer in her mother; Heart disease in her father; Spinal muscular atrophy in her sister.    ROS:  Please see the history of present illness.   Otherwise, review of systems are positive for none.   All other systems are reviewed and negative.    PHYSICAL EXAM: VS:  There were no vitals taken for this visit. , BMI There is no weight on file to calculate BMI. Affect appropriate Frail elderly female Walks with cane  HEENT: normal Neck supple with no adenopathy JVP normal no bruits no thyromegaly Lungs clear with no wheezing and good diaphragmatic motion Heart:  S1/S2 no murmur, no rub, gallop or click PMI normal Abdomen: benighn, BS positve, no tenderness, no AAA no bruit.  No HSM or HJR Distal pulses intact with no bruits No edema Neuro non-focal Skin warm and dry No muscular weakness    EKG:  6/15 afib rate 56 nonspecific ST changes    Recent Labs: 03/16/2015: ALT 73* 03/17/2015: TSH <0.010* 09/03/2015: B Natriuretic Peptide 312.0*; BUN 23*; Creatinine, Ser 1.32*; Hemoglobin 12.5; Platelets 274; Potassium 3.7; Sodium 137     Lipid Panel    Component Value Date/Time   CHOL 98 03/19/2015 0608   TRIG 42 03/19/2015 0608   HDL 35* 03/19/2015 0608   CHOLHDL 2.8 03/19/2015 0608   VLDL 8 03/19/2015 0608   LDLCALC 55 03/19/2015 0608      Wt Readings from Last 3 Encounters:  09/03/15 112 lb (50.803 kg)  03/17/15 111 lb 12.4 oz (50.7 kg)  12/18/14 110 lb 9.6 oz (50.168 kg)      Other studies Reviewed: Additional studies/ records that  were reviewed today include: ER visit 02/2015 labs MRI/CT ECG ER notes 6/14 ECG Echo .    ASSESSMENT AND PLAN:  1. Afib: duration and persistance unknown Likely responsible for stroke in December. D/C Plavix  Start Eliquis 2.5 bid. Samples given.  48 hr holter to see percent time she is in it and rate control  HR on low Side now with increased LE edema stop norvasc.  Continue beta blocker  2. Thyroid  ? No f/u for this releated to afib repeat TSH T4 consider tapasole if suppressed still Continue beta blocker Lab Results  Component Value Date   TSH <0.010* 03/17/2015  3. Edema:  D/c HCTZ start lasix f/u BMET 2 weeks  Echo since rhythm change Previous EF ok Suspect this is more related to venous Dx 4. Stroke:  Has had home PT/OT motor skills ok  Some cognitive debility and word finding issues See above start eliquis    Current medicines are reviewed at length with the patient today.  The patient does not have concerns regarding medicines.  The following changes have been made:  Stop HCTZ, Norvasc and plavix start cozaar , lasix eliquis   Labs/ tests ordered today include: Holter, TSH, BMET Echo   No orders of the defined types were placed in this encounter.     Disposition:   FU with NP 2 weeks      Signed, Jenkins Rouge, MD  09/04/2015 11:13 AM    [facility] [address], [place], [place]  [address] Phone: [phone]; Fax: [phone]

## 2015-09-04 NOTE — Patient Instructions (Addendum)
Your physician recommends that you schedule a follow-up appointment in: after testing with [facility] cardiologist   STOP Amlodipine  STOP losartan/hctz  STOP Plavix  GET LAB WORK BEFORE NEXT VISIT   START Lasix 20 mg daily  START Losartan 50 mg daily     Your physician has recommended that you wear a holter monitor. Holter monitors are medical devices that record the heart's electrical activity. Doctors most often use these monitors to diagnose arrhythmias. Arrhythmias are problems with the speed or rhythm of the heartbeat. The monitor is a small, portable device. You can wear one while you do your normal daily activities. This is usually used to diagnose what is causing palpitations/syncope (passing out).    Your physician has requested that you have an echocardiogram. Echocardiography is a painless test that uses sound waves to create images of your heart. It provides your doctor with information about the size and shape of your heart and how well your heart's chambers and valves are working. This procedure takes approximately one hour. There are no restrictions for this procedure.       Thank you for choosing [facility] !

## 2015-09-05 ENCOUNTER — Telehealth: Payer: Self-pay | Admitting: *Deleted

## 2015-09-05 LAB — BASIC METABOLIC PANEL
BUN: 22 mg/dL (ref 7–25)
CHLORIDE: 105 mmol/L (ref 98–110)
CO2: 27 mmol/L (ref 20–31)
Calcium: 9.6 mg/dL (ref 8.6–10.4)
Creat: 1.27 mg/dL — ABNORMAL HIGH (ref 0.60–0.88)
GLUCOSE: 104 mg/dL — AB (ref 65–99)
Potassium: 4.1 mmol/L (ref 3.5–5.3)
SODIUM: 142 mmol/L (ref 135–146)

## 2015-09-05 LAB — TSH: TSH: 0.01 mIU/L — ABNORMAL LOW

## 2015-09-05 LAB — T4: T4, Total: 8 ug/dL (ref 4.5–12.0)

## 2015-09-05 NOTE — Telephone Encounter (Signed)
-----   Message from Josue Hector, MD sent at 09/05/2015 10:44 AM EDT ----- T4 normal but TSH suppressed needs to f/u with primary/ endocrine to figure out what to do with thyroid

## 2015-09-05 NOTE — Telephone Encounter (Signed)
Called patient with test results. No answer. Unable to leave message.  

## 2015-09-08 ENCOUNTER — Ambulatory Visit (HOSPITAL_COMMUNITY)
Admission: RE | Admit: 2015-09-08 | Discharge: 2015-09-08 | Disposition: A | Discharge status: Home / Self Care | Payer: Medicare Other | Source: Ambulatory Visit | Attending: Cardiovascular Disease | Admitting: Cardiovascular Disease

## 2015-09-08 DIAGNOSIS — I482 Chronic atrial fibrillation, unspecified: Secondary | ICD-10-CM

## 2015-09-12 ENCOUNTER — Ambulatory Visit (HOSPITAL_COMMUNITY)
Admission: RE | Admit: 2015-09-12 | Discharge: 2015-09-12 | Disposition: A | Discharge status: Home / Self Care | Payer: Medicare Other | Source: Ambulatory Visit | Attending: Cardiovascular Disease | Admitting: Cardiovascular Disease

## 2015-09-12 DIAGNOSIS — I340 Nonrheumatic mitral (valve) insufficiency: Secondary | ICD-10-CM | POA: Diagnosis not present

## 2015-09-12 DIAGNOSIS — I119 Hypertensive heart disease without heart failure: Secondary | ICD-10-CM | POA: Insufficient documentation

## 2015-09-12 DIAGNOSIS — I071 Rheumatic tricuspid insufficiency: Secondary | ICD-10-CM | POA: Diagnosis not present

## 2015-09-12 DIAGNOSIS — I482 Chronic atrial fibrillation, unspecified: Secondary | ICD-10-CM

## 2015-09-12 DIAGNOSIS — I4891 Unspecified atrial fibrillation: Secondary | ICD-10-CM | POA: Diagnosis present

## 2015-09-12 DIAGNOSIS — I2510 Atherosclerotic heart disease of native coronary artery without angina pectoris: Secondary | ICD-10-CM | POA: Insufficient documentation

## 2015-09-12 DIAGNOSIS — I351 Nonrheumatic aortic (valve) insufficiency: Secondary | ICD-10-CM | POA: Insufficient documentation

## 2015-09-12 DIAGNOSIS — E119 Type 2 diabetes mellitus without complications: Secondary | ICD-10-CM | POA: Diagnosis not present

## 2015-09-12 LAB — ECHOCARDIOGRAM COMPLETE
CHL CUP TV REG PEAK VELOCITY: 301 cm/s
EWDT: 194 ms
FS: 26 % — AB (ref 28–44)
IVS/LV PW RATIO, ED: 1.03
LA ID, A-P, ES: 42 mm
LA vol A4C: 52 ml
LA vol: 49.2 mL
LADIAMINDEX: 2.7 cm/m2
LAVOLIN: 31.6 mL/m2
LEFT ATRIUM END SYS DIAM: 42 mm
LV PW d: 7.68 mm — AB (ref 0.6–1.1)
LV SIMPSON'S DISK: 63
LV dias vol: 52 mL (ref 46–106)
LV sys vol index: 12 mL/m2
LV sys vol: 19 mL (ref 14–42)
LVDIAVOLIN: 33 mL/m2
LVOT area: 2.01 cm2
LVOTD: 16 mm
MV Dec: 194
MV pk A vel: 47.7 m/s
MVPG: 7 mmHg
MVPKEVEL: 136 m/s
P 1/2 time: 448 ms
Stroke v: 33 ml
TAPSE: 19.1 mm
TR max vel: 301 cm/s
VTI: 193 cm

## 2015-09-16 ENCOUNTER — Telehealth: Payer: Self-pay | Admitting: *Deleted

## 2015-09-16 DIAGNOSIS — N760 Acute vaginitis: Secondary | ICD-10-CM | POA: Diagnosis not present

## 2015-09-16 DIAGNOSIS — I119 Hypertensive heart disease without heart failure: Secondary | ICD-10-CM | POA: Diagnosis not present

## 2015-09-16 NOTE — Telephone Encounter (Signed)
Pt notified of echo results by phone with verbal understanding 

## 2015-11-12 ENCOUNTER — Encounter (HOSPITAL_COMMUNITY): Payer: Self-pay | Admitting: Emergency Medicine

## 2015-11-12 ENCOUNTER — Inpatient Hospital Stay (HOSPITAL_COMMUNITY)
Admission: EM | Admit: 2015-11-12 | Discharge: 2015-11-19 | DRG: 377 | Disposition: A | Discharge status: Skilled Nursing Facility | Payer: Medicare Other | Attending: Pulmonary Disease | Admitting: Pulmonary Disease

## 2015-11-12 DIAGNOSIS — H409 Unspecified glaucoma: Secondary | ICD-10-CM | POA: Diagnosis not present

## 2015-11-12 DIAGNOSIS — E43 Unspecified severe protein-calorie malnutrition: Secondary | ICD-10-CM | POA: Diagnosis present

## 2015-11-12 DIAGNOSIS — I482 Chronic atrial fibrillation, unspecified: Secondary | ICD-10-CM

## 2015-11-12 DIAGNOSIS — I639 Cerebral infarction, unspecified: Secondary | ICD-10-CM | POA: Diagnosis present

## 2015-11-12 DIAGNOSIS — Z8673 Personal history of transient ischemic attack (TIA), and cerebral infarction without residual deficits: Secondary | ICD-10-CM

## 2015-11-12 DIAGNOSIS — Z9010 Acquired absence of unspecified breast and nipple: Secondary | ICD-10-CM

## 2015-11-12 DIAGNOSIS — I4891 Unspecified atrial fibrillation: Secondary | ICD-10-CM | POA: Diagnosis present

## 2015-11-12 DIAGNOSIS — N3281 Overactive bladder: Secondary | ICD-10-CM | POA: Diagnosis present

## 2015-11-12 DIAGNOSIS — K766 Portal hypertension: Secondary | ICD-10-CM | POA: Diagnosis not present

## 2015-11-12 DIAGNOSIS — M6281 Muscle weakness (generalized): Secondary | ICD-10-CM | POA: Diagnosis not present

## 2015-11-12 DIAGNOSIS — Z853 Personal history of malignant neoplasm of breast: Secondary | ICD-10-CM | POA: Diagnosis not present

## 2015-11-12 DIAGNOSIS — I2510 Atherosclerotic heart disease of native coronary artery without angina pectoris: Secondary | ICD-10-CM | POA: Diagnosis present

## 2015-11-12 DIAGNOSIS — I10 Essential (primary) hypertension: Secondary | ICD-10-CM | POA: Diagnosis present

## 2015-11-12 DIAGNOSIS — R278 Other lack of coordination: Secondary | ICD-10-CM | POA: Diagnosis not present

## 2015-11-12 DIAGNOSIS — K5521 Angiodysplasia of colon with hemorrhage: Secondary | ICD-10-CM | POA: Diagnosis not present

## 2015-11-12 DIAGNOSIS — I6930 Unspecified sequelae of cerebral infarction: Secondary | ICD-10-CM

## 2015-11-12 DIAGNOSIS — G9341 Metabolic encephalopathy: Secondary | ICD-10-CM | POA: Diagnosis present

## 2015-11-12 DIAGNOSIS — Z9181 History of falling: Secondary | ICD-10-CM | POA: Diagnosis not present

## 2015-11-12 DIAGNOSIS — K3189 Other diseases of stomach and duodenum: Secondary | ICD-10-CM | POA: Diagnosis present

## 2015-11-12 DIAGNOSIS — E7800 Pure hypercholesterolemia, unspecified: Secondary | ICD-10-CM | POA: Diagnosis present

## 2015-11-12 DIAGNOSIS — G9340 Encephalopathy, unspecified: Secondary | ICD-10-CM | POA: Diagnosis not present

## 2015-11-12 DIAGNOSIS — E119 Type 2 diabetes mellitus without complications: Secondary | ICD-10-CM

## 2015-11-12 DIAGNOSIS — Z8249 Family history of ischemic heart disease and other diseases of the circulatory system: Secondary | ICD-10-CM | POA: Diagnosis not present

## 2015-11-12 DIAGNOSIS — Z7901 Long term (current) use of anticoagulants: Secondary | ICD-10-CM

## 2015-11-12 DIAGNOSIS — Z5189 Encounter for other specified aftercare: Secondary | ICD-10-CM | POA: Diagnosis not present

## 2015-11-12 DIAGNOSIS — R293 Abnormal posture: Secondary | ICD-10-CM | POA: Diagnosis not present

## 2015-11-12 DIAGNOSIS — E876 Hypokalemia: Secondary | ICD-10-CM | POA: Diagnosis present

## 2015-11-12 DIAGNOSIS — K219 Gastro-esophageal reflux disease without esophagitis: Secondary | ICD-10-CM | POA: Diagnosis present

## 2015-11-12 DIAGNOSIS — K449 Diaphragmatic hernia without obstruction or gangrene: Secondary | ICD-10-CM | POA: Diagnosis present

## 2015-11-12 DIAGNOSIS — R488 Other symbolic dysfunctions: Secondary | ICD-10-CM | POA: Diagnosis not present

## 2015-11-12 DIAGNOSIS — K922 Gastrointestinal hemorrhage, unspecified: Secondary | ICD-10-CM | POA: Diagnosis present

## 2015-11-12 DIAGNOSIS — K31819 Angiodysplasia of stomach and duodenum without bleeding: Secondary | ICD-10-CM | POA: Diagnosis not present

## 2015-11-12 DIAGNOSIS — R339 Retention of urine, unspecified: Secondary | ICD-10-CM | POA: Diagnosis not present

## 2015-11-12 LAB — URINE MICROSCOPIC-ADD ON: RBC / HPF: NONE SEEN RBC/hpf (ref 0–5)

## 2015-11-12 LAB — COMPREHENSIVE METABOLIC PANEL
ALT: 25 U/L (ref 14–54)
ANION GAP: 4 — AB (ref 5–15)
AST: 36 U/L (ref 15–41)
Albumin: 3.9 g/dL (ref 3.5–5.0)
Alkaline Phosphatase: 103 U/L (ref 38–126)
BILIRUBIN TOTAL: 0.9 mg/dL (ref 0.3–1.2)
BUN: 16 mg/dL (ref 6–20)
CO2: 28 mmol/L (ref 22–32)
Calcium: 8.6 mg/dL — ABNORMAL LOW (ref 8.9–10.3)
Chloride: 107 mmol/L (ref 101–111)
Creatinine, Ser: 0.81 mg/dL (ref 0.44–1.00)
GFR calc Af Amer: >60 mL/min (ref >60)
Glucose, Bld: 105 mg/dL — ABNORMAL HIGH (ref 65–99)
POTASSIUM: 3 mmol/L — AB (ref 3.5–5.1)
Sodium: 139 mmol/L (ref 135–145)
TOTAL PROTEIN: 6.6 g/dL (ref 6.5–8.1)

## 2015-11-12 LAB — CBC WITH DIFFERENTIAL/PLATELET
BASOS ABS: 0 10*3/uL (ref 0.0–0.1)
BASOS PCT: 0 %
EOS PCT: 1 %
Eosinophils Absolute: 0.1 10*3/uL (ref 0.0–0.7)
HCT: 35.1 % — ABNORMAL LOW (ref 36.0–46.0)
Hemoglobin: 11.5 g/dL — ABNORMAL LOW (ref 12.0–15.0)
LYMPHS PCT: 17 %
Lymphs Abs: 1.5 10*3/uL (ref 0.7–4.0)
MCH: 28.5 pg (ref 26.0–34.0)
MCHC: 32.8 g/dL (ref 30.0–36.0)
MCV: 87.1 fL (ref 78.0–100.0)
MONO ABS: 1 10*3/uL (ref 0.1–1.0)
Monocytes Relative: 12 %
NEUTROS ABS: 5.9 10*3/uL (ref 1.7–7.7)
Neutrophils Relative %: 70 %
PLATELETS: 193 10*3/uL (ref 150–400)
RBC: 4.03 MIL/uL (ref 3.87–5.11)
RDW: 13.9 % (ref 11.5–15.5)
WBC: 8.4 10*3/uL (ref 4.0–10.5)

## 2015-11-12 LAB — CBC
HEMATOCRIT: 37.8 % (ref 36.0–46.0)
HEMOGLOBIN: 12.4 g/dL (ref 12.0–15.0)
MCH: 28.5 pg (ref 26.0–34.0)
MCHC: 32.8 g/dL (ref 30.0–36.0)
MCV: 86.9 fL (ref 78.0–100.0)
Platelets: 251 10*3/uL (ref 150–400)
RBC: 4.35 MIL/uL (ref 3.87–5.11)
RDW: 13.8 % (ref 11.5–15.5)
WBC: 9.3 10*3/uL (ref 4.0–10.5)

## 2015-11-12 LAB — POC OCCULT BLOOD, ED: FECAL OCCULT BLD: NEGATIVE

## 2015-11-12 LAB — URINALYSIS, ROUTINE W REFLEX MICROSCOPIC
Bilirubin Urine: NEGATIVE
Glucose, UA: NEGATIVE mg/dL
Hgb urine dipstick: NEGATIVE
KETONES UR: NEGATIVE mg/dL
NITRITE: POSITIVE — AB
PH: 6 (ref 5.0–8.0)
Protein, ur: NEGATIVE mg/dL
Specific Gravity, Urine: 1.01 (ref 1.005–1.030)

## 2015-11-12 LAB — LIPASE, BLOOD: Lipase: 29 U/L (ref 11–51)

## 2015-11-12 MED ORDER — SODIUM CHLORIDE 0.9% FLUSH
3.0000 mL | Freq: Two times a day (BID) | INTRAVENOUS | Status: DC
  Administered 2015-11-12 – 2015-11-18 (×13): 3 mL via INTRAVENOUS

## 2015-11-12 MED ORDER — KCL IN DEXTROSE-NACL 40-5-0.9 MEQ/L-%-% IV SOLN
INTRAVENOUS | Status: DC
  Administered 2015-11-12 – 2015-11-13 (×3): via INTRAVENOUS
  Administered 2015-11-14: 1000 mL via INTRAVENOUS
  Filled 2015-11-12 (×9): qty 1000

## 2015-11-12 MED ORDER — DEXTROSE-NACL 5-0.9 % IV SOLN
INTRAVENOUS | Status: DC
  Administered 2015-11-12: 19:00:00 via INTRAVENOUS

## 2015-11-12 MED ORDER — ATORVASTATIN CALCIUM 40 MG PO TABS
80.0000 mg | ORAL_TABLET | Freq: Every day | ORAL | Status: DC
  Administered 2015-11-13 – 2015-11-18 (×4): 80 mg via ORAL
  Filled 2015-11-12 (×5): qty 2
  Filled 2015-11-12: qty 1

## 2015-11-12 MED ORDER — ACETAMINOPHEN 325 MG PO TABS: 325.0000 mg | ORAL_TABLET | Freq: Every day | ORAL | Status: DC | PRN

## 2015-11-12 MED ORDER — FAMOTIDINE IN NACL 20-0.9 MG/50ML-% IV SOLN: 20.0000 mg | Freq: Two times a day (BID) | INTRAVENOUS | Status: DC

## 2015-11-12 MED ORDER — PANTOPRAZOLE SODIUM 40 MG IV SOLR
40.0000 mg | Freq: Two times a day (BID) | INTRAVENOUS | Status: DC
  Administered 2015-11-12 – 2015-11-13 (×3): 40 mg via INTRAVENOUS
  Filled 2015-11-12 (×3): qty 40

## 2015-11-12 NOTE — Consult Note (Signed)
Referring Provider: Minus Liberty, MD  Primary Care Physician:  Alonza Bogus, MD Primary Gastroenterologist:  Dr. Laural Golden  Reason for Consultation:    Upper GI bleed.  HPI:   History is provided by the patient and her son Kasandra Knudsen.  Patient is 82 year old Caucasian female with multiple medical problems who lives at home(her son Kasandra Knudsen stays with her) and was in usual state of health until this morning when she developed nausea followed by coffee-ground emesis. She had multiple episodes. She was brought to the emergency room for evaluation and noted to be hemodynamically stable. At the present time patient denies nausea or heartburn. She is on omeprazole for chronic GERD. She also denies abdominal pain melena or rectal bleeding. Her stool was noted to be guaiac negative in emergency room. 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 She therefore has been on clopidogrel and Apixaban. There is no history of peptic ulcer disease. Patient denies dysphagia. According to patient's son she is busy at home. She does cleaning and cooking. She has not driven car in about a year.      Past Medical History:  Diagnosis Date      .       Marland Kitchen Coronary artery disease   . Cyst of breast    Right - at 51 yrs of age.  Marland Kitchen DCIS (ductal carcinoma in situ) of breast 2012   High grade DCIS; Rt breast.Status post mastectomy in July 2012.   . Diabetes mellitus without complication ([place])   . H/O Diverticulitis   . Elevated cholesterol   . GERD (gastroesophageal reflux disease)    Multiple CVAs in the past . she had one in December 2016 thought to be due to embolic phenomenon.     Small Intracranial aneurysm.     Intracerebral hemorrhage and right temporal and occipital region back in March 2004.    Marland Kitchen  atrial fibrillation.  08/19/2010  . Hypertension   . Migraine headache   .  09/02/11  .    Marland Kitchen  2004       Past  Surgical History:  Procedure Laterality Date  . BRAIN SURGERY     hemorrhage  . BREAST SURGERY  09/25/2010   mastectomy  . CYSTECTOMY    . fibroid tumors    . KNEE ARTHROSCOPY Right    right knee x 3  . LAPAROSCOPIC BILATERAL SALPINGO OOPHERECTOMY    . SPINE SURGERY     repair herniated disc    Prior to Admission medications   Medication Sig Start Date End Date Taking? Authorizing Provider  acetaminophen (TYLENOL) 325 MG tablet Take 325 mg by mouth daily as needed for moderate pain.   Yes Historical Provider, MD  apixaban (ELIQUIS) 2.5 MG TABS tablet Take 1 tablet (2.5 mg total) by mouth 2 (two) times daily. 09/03/15  Yes [name], MD  atorvastatin (LIPITOR) 80 MG tablet Take 1 tablet (80 mg total) by mouth every morning. 03/20/15  Yes Sinda Du, MD  calcium carbonate (TUMS EX) 750 MG chewable tablet Chew 2 tablets by mouth daily.    Yes Historical Provider, MD  furosemide (LASIX) 20 MG tablet Take 1 tablet (20 mg total) by mouth daily. 09/04/15  Yes Josue Hector, MD  losartan (COZAAR) 50 MG tablet Take 1 tablet (50 mg total) by mouth daily. 09/04/15  Yes Josue Hector, MD  Methylcellulose, Laxative, (CITRUCEL) 500 MG TABS Take 1  tablet by mouth daily.    Yes Historical Provider, MD  metoprolol (LOPRESSOR) 50 MG tablet Take 50 mg by mouth 2 (two) times daily.    Yes Historical Provider, MD  Multiple Vitamin (MULTIVITAMIN WITH MINERALS) TABS Take 1 tablet by mouth every morning. *Contains NO Iron (Ferrous Sulfate and/or Iodine)*   Yes Historical Provider, MD  omeprazole (PRILOSEC) 20 MG capsule Take 20 mg by mouth every morning.    Yes Historical Provider, MD  oxybutynin (DITROPAN) 5 MG tablet Take 0.5-1 tablets by mouth 2 (two) times daily. May increase 1 tablet 3 times daily if needed. 08/22/15  Yes Historical Provider, MD  trimethoprim (TRIMPEX) 100 MG tablet Take 100 mg by mouth at bedtime.   Yes Historical Provider, MD  vitamin C (ASCORBIC ACID) 500 MG tablet Take 500 mg by  mouth every morning.    Yes Historical Provider, MD    No current facility-administered medications for this encounter.    Current Outpatient Prescriptions  Medication Sig Dispense Refill  . acetaminophen (TYLENOL) 325 MG tablet Take 325 mg by mouth daily as needed for moderate pain.    Marland Kitchen apixaban (ELIQUIS) 2.5 MG TABS tablet Take 1 tablet (2.5 mg total) by mouth 2 (two) times daily. 60 tablet 0  . atorvastatin (LIPITOR) 80 MG tablet Take 1 tablet (80 mg total) by mouth every morning. 30 tablet 12  . calcium carbonate (TUMS EX) 750 MG chewable tablet Chew 2 tablets by mouth daily.     . furosemide (LASIX) 20 MG tablet Take 1 tablet (20 mg total) by mouth daily. 90 tablet 3  . losartan (COZAAR) 50 MG tablet Take 1 tablet (50 mg total) by mouth daily. 90 tablet 3  . Methylcellulose, Laxative, (CITRUCEL) 500 MG TABS Take 1 tablet by mouth daily.     . metoprolol (LOPRESSOR) 50 MG tablet Take 50 mg by mouth 2 (two) times daily.     . Multiple Vitamin (MULTIVITAMIN WITH MINERALS) TABS Take 1 tablet by mouth every morning. *Contains NO Iron (Ferrous Sulfate and/or Iodine)*    . omeprazole (PRILOSEC) 20 MG capsule Take 20 mg by mouth every morning.     Marland Kitchen oxybutynin (DITROPAN) 5 MG tablet Take 0.5-1 tablets by mouth 2 (two) times daily. May increase 1 tablet 3 times daily if needed.    . trimethoprim (TRIMPEX) 100 MG tablet Take 100 mg by mouth at bedtime.    . vitamin C (ASCORBIC ACID) 500 MG tablet Take 500 mg by mouth every morning.       Allergies as of 11/12/2015 - Review Complete 11/12/2015  Allergen Reaction Noted  . Tape Rash 10/06/2010  . Ciprofloxacin  07/16/2013  . Sulfa antibiotics Hives 03/15/2013  . Codeine Nausea Only 09/07/2010  . Morphine and related Nausea Only 09/07/2010    Family history. Mother died at age 28 with father lived to be 72. Her sister died over a year ago at age 61. Further details not available.  Social history: She is widowed. She is retired from [employer]  where she worked for 39-1/2 years. She never smoke cigarettes or drank alcohol. One son died during infancy. She has one son age 107 who stays with her.  Review of Systems: See HPI, otherwise normal ROS  Physical Exam: Temp:  [98.5 F (36.9 C)] 98.5 F (36.9 C) (08/23 1312) Pulse Rate:  [66-74] 66 (08/23 1617) Resp:  [19-20] 19 (08/23 1444) BP: (142-158)/(55-73) 142/55 (08/23 1600) SpO2:  [98 %-100 %] 98 % (08/23 1617) Weight:  [  115 lb (52.2 kg)] 115 lb (52.2 kg) (08/23 1317)   Well-developed thin Caucasian female who is in no acute distress. She responds appropriately to questions. Conjunctiva is pink. Sclera is nonicteric. Oropharyngeal mucosa is normal.  Few teeth are missing but remaining dentition in satisfactory condition. No neck masses or thyromegaly noted. Cardiac exam with regular rhythm normal S1 and S2. No murmur or gallop noted. Lungs are clear to auscultation. Abdomen is symmetrical. Bowel sounds are normal. On palpation is soft and nontender without organomegaly or masses. Rectal examination deferred. One was just done by ER physician. No peripheral edema or clubbing noted.     Lab Results:  Recent Labs  11/12/15 1336  WBC 9.3  HGB 12.4  HCT 37.8  PLT 251   BMET  Recent Labs  11/12/15 1336  NA 139  K 3.0*  CL 107  CO2 28  GLUCOSE 105*  BUN 16  CREATININE 0.81  CALCIUM 8.6*   LFT  Recent Labs  11/12/15 1336  PROT 6.6  ALBUMIN 3.9  AST 36  ALT 25  ALKPHOS 103  BILITOT 0.9     Assessment; Patient is 82 year old Caucasian female with multiple medical problems including history of atrial fibrillation and multiple CVAs in the past who presents with upper GI bleed. She is on an antiplatelet agent and on anticoagulant. She has chronic GERD and maintain on PPI. Her hemoglobin is normal and she is hemodynamically stable. Differential diagnosis includes peptic ulcer disease GI angiodysplasia or gastric esophageal reflux  disease.   Hypokalemia.    Recommendations; Hold clopidogrel and apixaban for now.  IV pantoprazole 40 mg IV every 12 hours or as continuous infusion. Serial H&H. She will need IV potassium to treat hypokalemia. Will plan EGD on 11/13/2015.  Procedure explained to the patient and she is agreeable. Her son Kasandra Knudsen who has the power of attorney is also agreeable.   LOS: 0 days   Najeeb Rehman  11/12/2015, 5:10 PM

## 2015-11-12 NOTE — ED Notes (Signed)
MD at bedside. 

## 2015-11-12 NOTE — ED Notes (Signed)
Attempted x 2 for IV access without success to left forearm

## 2015-11-12 NOTE — ED Notes (Signed)
This nurse present during EDP's rectal exam

## 2015-11-12 NOTE — ED Provider Notes (Signed)
[facility] DEPT Provider Note   CSN: [id number] Arrival date & time: 11/12/15  1308     History   Chief Complaint Chief Complaint  Patient presents with  . Emesis    HPI Caroline Aguirre is a 82 y.o. female with history of CVAs (on Eliquis and Plavix) who presents with multiple episodes of coffee-ground emesis today.  Limited history for patient due to baseline cognitive impairment.  Last night, she had some nausea and vomiting.  She cannot say what color the emesis was at that time.  Today, around 1200, she had multiple back-to-back episodes of vomiting with coffee ground emesis.  No red blood from mouth or anus.  Denies dizziness, chest pain, shortness of breath.  No prior GI bleeding.  No history of alcohol use.  HPI  Past Medical History:  Diagnosis Date  . Arthritis   . Cancer ([place])    Rt Breast  . Coronary artery disease   . Cyst of breast    Right - at 26 yrs of age.  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 Diabetes mellitus without complication ([place])   . Diverticulitis   . Elevated cholesterol   . GERD (gastroesophageal reflux disease)   . Hardening of the arteries of the brain   . Heart disease   . Herniated disc   . Hx Breast cancer, DCIS, Right 08/19/2010  . Hypertension   . Migraine headache   . SBO (small bowel obstruction) ([place]) 09/02/11  . Shingles   . Stroke [facility]) 2004   cerebral hemmorhage    Patient Active Problem List   Diagnosis Date Noted  . ARF (acute renal failure) ([place]) 03/16/2015  . Acute encephalopathy 03/16/2015  . DM type 2 (diabetes mellitus, type 2) ([place]) 03/16/2015  . Urinary retention 03/16/2015  . AKI (acute kidney injury) ([place]) 03/16/2015  . Coronary artery disease 03/16/2015  . Essential hypertension 03/16/2015  . Sinus bradycardia 03/16/2015  . Vulvar irritation 07/17/2013  . SBO (small bowel obstruction) ([place]) 09/03/2011  . Cyst of breast, right, solitary 09/07/2010  .  Fatigue/loss of sleep 09/07/2010  . High blood pressure 09/07/2010  . Bronchitis 09/07/2010  . Skin moles (abnormal) 09/07/2010  . Gastroesophageal reflux disease with hiatal hernia 09/07/2010  . Hearing loss 09/07/2010  . Wears glasses and/or contacts 09/07/2010  . Glaucoma 09/07/2010  . Menopause 09/07/2010  . Herniated disc   . Shingles   . Hardening of the arteries of the brain   . Stroke ([place])   . Hx Breast cancer, DCIS, Right 08/19/2010    Past Surgical History:  Procedure Laterality Date  . BRAIN SURGERY     hemorrhage  . BREAST SURGERY  09/25/2010   mastectomy  . CYSTECTOMY    . fibroid tumors    . KNEE ARTHROSCOPY Right    right knee x 3  . LAPAROSCOPIC BILATERAL SALPINGO OOPHERECTOMY    . SPINE SURGERY     repair herniated disc    OB History    No data available       Home Medications    Prior to Admission medications   Medication Sig Start Date End Date Taking? Authorizing Provider  acetaminophen (TYLENOL) 325 MG tablet Take 325 mg by mouth daily as needed for moderate pain.    Historical Provider, MD  apixaban (ELIQUIS) 2.5 MG TABS tablet Take 1 tablet (2.5 mg total) by mouth 2 (two) times daily. 09/03/15   Vonna Kotyk  Reather Converse, MD  atorvastatin (LIPITOR) 80 MG tablet Take 1 tablet (80 mg total) by mouth every morning. 03/20/15   Sinda Du, MD  calcium carbonate (TUMS EX) 750 MG chewable tablet Chew 2 tablets by mouth daily.     Historical Provider, MD  furosemide (LASIX) 20 MG tablet Take 1 tablet (20 mg total) by mouth daily. 09/04/15   Josue Hector, MD  losartan (COZAAR) 50 MG tablet Take 1 tablet (50 mg total) by mouth daily. 09/04/15   Josue Hector, MD  Methylcellulose, Laxative, (CITRUCEL) 500 MG TABS Take 1 tablet by mouth daily.     Historical Provider, MD  metoprolol (LOPRESSOR) 50 MG tablet Take 50 mg by mouth 2 (two) times daily.     Historical Provider, MD  Multiple Vitamin (MULTIVITAMIN WITH MINERALS) TABS Take 1 tablet by mouth every morning.  *Contains NO Iron (Ferrous Sulfate and/or Iodine)*    Historical Provider, MD  omeprazole (PRILOSEC) 20 MG capsule Take 20 mg by mouth every morning.     Historical Provider, MD  oxybutynin (DITROPAN) 5 MG tablet Take 0.5-1 tablets by mouth 2 (two) times daily. May increase 1 tablet 3 times daily if needed. 08/22/15   Historical Provider, MD  trimethoprim (TRIMPEX) 100 MG tablet Take 100 mg by mouth at bedtime.    Historical Provider, MD  vitamin C (ASCORBIC ACID) 500 MG tablet Take 500 mg by mouth every morning.     Historical Provider, MD    Family History Family History  Problem Relation Age of Onset  . Cancer Mother   . Heart disease Father   . Spinal muscular atrophy Sister     spinal meningitis    Social History Social History  Substance Use Topics  . Smoking status: Never Smoker  . Smokeless tobacco: Never Used  . Alcohol use No     Allergies   Tape; Ciprofloxacin; Sulfa antibiotics; Codeine; and Morphine and related   Review of Systems Review of Systems  Respiratory: Negative for cough, chest tightness and shortness of breath.   Cardiovascular: Negative for chest pain and palpitations.  Gastrointestinal: Positive for nausea and vomiting. Negative for abdominal distention, abdominal pain, blood in stool, constipation and diarrhea.   Review of systems limited by cognitive impairment.  Physical Exam Updated Vital Signs BP 152/70 (BP Location: Left Arm)   Pulse 70   Temp 98.5 F (36.9 C) (Temporal)   Resp 19   Ht 5\' 3"  (1.6 m)   Wt 52.2 kg   SpO2 100%   BMI 20.37 kg/m   Physical Exam  Constitutional: She appears well-developed and well-nourished. No distress.  HENT:  Head: Normocephalic and atraumatic.  No oral bleeding or wounds  Eyes: Conjunctivae are normal. No scleral icterus.  Neck: Normal range of motion. Neck supple.  Cardiovascular: Normal rate, regular rhythm and normal heart sounds.   Pulmonary/Chest: Effort normal and breath sounds normal.    Abdominal: Soft. She exhibits no distension. There is no tenderness.  Genitourinary:  Genitourinary Comments: Rectal exam with firm brown stool in rectal vault.  Hemoccult negative.  Musculoskeletal: She exhibits no tenderness.  Mild symmetric bilateral LE edema  Neurological: No cranial nerve deficit. She exhibits normal muscle tone.  Alert, AO x1-2.  Baseline per family in room.  Skin: Skin is warm and dry. No pallor.  Psychiatric: She has a normal mood and affect. Her behavior is normal.     ED Treatments / Results  Labs (all labs ordered are listed, but only abnormal  results are displayed) Labs Reviewed  COMPREHENSIVE METABOLIC PANEL - Abnormal; Notable for the following:       Result Value   Potassium 3.0 (*)    Glucose, Bld 105 (*)    Calcium 8.6 (*)    Anion gap 4 (*)    All other components within normal limits  LIPASE, BLOOD  CBC  URINALYSIS, ROUTINE W REFLEX MICROSCOPIC (NOT AT [facility])  POC OCCULT BLOOD, ED    EKG  EKG Interpretation None       Radiology No results found.  Procedures Procedures (including critical care time)  Medications Ordered in ED Medications - No data to display   Initial Impression / Assessment and Plan / ED Course  I have reviewed the triage vital signs and the nursing notes.  Pertinent labs & imaging results that were available during my care of the patient were reviewed by me and considered in my medical decision making (see chart for details).  History concerning for upper GI bleeding patient on anticoagulants.  Hemodynamically stable, clinically low risk of large ongoing bleed currently.  Mallory-Weiss tear is possible with repeated vomiting.  No alcohol history or other risk factors for varices/cirrhosis.  No chronic NSAID use, but gastritis or PUD are possible.  Clinical Course  Comment By Time  Talked to gastroenterologist on call Dr Laural Golden.  Recommend ICU admission for observation, plan for EGD in morning. Minus Liberty, MD 08/23 1547    Final Clinical Impressions(s) / ED Diagnoses   Final diagnoses:  Upper GI bleed   Admit to MICU for observation.  NPO tonight, plan for EGD tomorrow morning.  New Prescriptions New Prescriptions   No medications on file     Minus Liberty, MD 11/12/15 1552    Merrily Pew, MD 11/12/15 [place], MD 11/12/15 [phone]

## 2015-11-12 NOTE — ED Triage Notes (Signed)
Pt c/o n/v that began yesterday. Pt's son states vomit is very dark. Pt unable to keep anything down per family. Pt denies any abdominal pain or blood in stool.

## 2015-11-12 NOTE — H&P (Addendum)
History and Physical    Caroline Aguirre [id number] DOB: 08-Feb-1925 DOA: 11/12/2015  PCP: Alonza Bogus, MD   Patient coming from: Home.   Chief Complaint: vomiting coffee ground.   HPI: Caroline Aguirre is a 82 y.o. female with medical history significant of HTN, prior CVA, DCIS, DM, Migraine, prior SBO, presented to the ER as her son saw her having several bouts of coffee ground emesis.  She has mild abdominal pain.  She has been on ASA and Eliquis for recent diagnosis of afib, seeing Dr Flonnie Overman.    ED Course:  She was found to have Hb of 12.4 g per dL, BUN and Cr, with stable hemodynamics.  Dr Dereck Leep was consulted by EDP, and hospitalist was asked to admit her for further work up.   Review of Systems: As per HPI otherwise 10 point review of systems negative.   Past Medical History:  Diagnosis Date  . Arthritis   . Cancer ([place])    Rt Breast  . Coronary artery disease   . Cyst of breast    Right - at 81 yrs of age.  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 Diabetes mellitus without complication ([place])   . Diverticulitis   . Elevated cholesterol   . GERD (gastroesophageal reflux disease)   . Hardening of the arteries of the brain   . Heart disease   . Herniated disc   . Hx Breast cancer, DCIS, Right 08/19/2010  . Hypertension   . Migraine headache   . SBO (small bowel obstruction) ([place]) 09/02/11  . Shingles   . Stroke [facility]) 2004   cerebral hemmorhage    Past Surgical History:  Procedure Laterality Date  . BRAIN SURGERY     hemorrhage  . BREAST SURGERY  09/25/2010   mastectomy  . CYSTECTOMY    . fibroid tumors    . KNEE ARTHROSCOPY Right    right knee x 3  . LAPAROSCOPIC BILATERAL SALPINGO OOPHERECTOMY    . SPINE SURGERY     repair herniated disc     reports that she has never smoked. She has never used smokeless tobacco. She reports that she does not drink alcohol or use drugs.  Allergies  Allergen Reactions  . Tape  Rash  . Ciprofloxacin   . Sulfa Antibiotics Hives  . Codeine Nausea Only    nausea.  . Morphine And Related Nausea Only    Nausea only.    Family History  Problem Relation Age of Onset  . Cancer Mother   . Heart disease Father   . Spinal muscular atrophy Sister     spinal meningitis    Prior to Admission medications   Medication Sig Start Date End Date Taking? Authorizing Provider  acetaminophen (TYLENOL) 325 MG tablet Take 325 mg by mouth daily as needed for moderate pain.   Yes Historical Provider, MD  apixaban (ELIQUIS) 2.5 MG TABS tablet Take 1 tablet (2.5 mg total) by mouth 2 (two) times daily. 09/03/15  Yes [name], MD  atorvastatin (LIPITOR) 80 MG tablet Take 1 tablet (80 mg total) by mouth every morning. 03/20/15  Yes Sinda Du, MD  calcium carbonate (TUMS EX) 750 MG chewable tablet Chew 2 tablets by mouth daily.    Yes Historical Provider, MD  furosemide (LASIX) 20 MG tablet Take 1 tablet (20 mg total) by mouth daily. 09/04/15  Yes Josue Hector, MD  losartan (COZAAR) 50 MG tablet  Take 1 tablet (50 mg total) by mouth daily. 09/04/15  Yes Josue Hector, MD  Methylcellulose, Laxative, (CITRUCEL) 500 MG TABS Take 1 tablet by mouth daily.    Yes Historical Provider, MD  metoprolol (LOPRESSOR) 50 MG tablet Take 50 mg by mouth 2 (two) times daily.    Yes Historical Provider, MD  Multiple Vitamin (MULTIVITAMIN WITH MINERALS) TABS Take 1 tablet by mouth every morning. *Contains NO Iron (Ferrous Sulfate and/or Iodine)*   Yes Historical Provider, MD  omeprazole (PRILOSEC) 20 MG capsule Take 20 mg by mouth every morning.    Yes Historical Provider, MD  oxybutynin (DITROPAN) 5 MG tablet Take 0.5-1 tablets by mouth 2 (two) times daily. May increase 1 tablet 3 times daily if needed. 08/22/15  Yes Historical Provider, MD  trimethoprim (TRIMPEX) 100 MG tablet Take 100 mg by mouth at bedtime.   Yes Historical Provider, MD  vitamin C (ASCORBIC ACID) 500 MG tablet Take 500 mg by mouth  every morning.    Yes Historical Provider, MD    Physical Exam: Vitals:   11/12/15 1500 11/12/15 1530 11/12/15 1600 11/12/15 1617  BP: 149/60 147/73 142/55   Pulse: 72   66  Resp:      Temp:      TempSrc:      SpO2: 98%   98%  Weight:      Height:          Constitutional: NAD, calm, comfortable Vitals:   11/12/15 1500 11/12/15 1530 11/12/15 1600 11/12/15 1617  BP: 149/60 147/73 142/55   Pulse: 72   66  Resp:      Temp:      TempSrc:      SpO2: 98%   98%  Weight:      Height:       Eyes: PERRL, lids and conjunctivae normal ENMT: Mucous membranes are moist. Posterior pharynx clear of any exudate or lesions.Normal dentition.  Neck: normal, supple, no masses, no thyromegaly Respiratory: clear to auscultation bilaterally, no wheezing, no crackles. Normal respiratory effort. No accessory muscle use.  Cardiovascular: Regular rate and rhythm, no murmurs / rubs / gallops. No extremity edema. 2+ pedal pulses. No carotid bruits.  Abdomen: no tenderness, no masses palpated. No hepatosplenomegaly. Bowel sounds positive.  Musculoskeletal: no clubbing / cyanosis. No joint deformity upper and lower extremities. Good ROM, no contractures. Normal muscle tone.  Skin: no rashes, lesions, ulcers. No induration Neurologic: CN 2-12 grossly intact. Sensation intact, DTR normal. Strength 5/5 in all 4.  Psychiatric: Normal judgment and insight. Alert and oriented x 3. Normal mood.   Labs on Admission: I have personally reviewed following labs and imaging studies  CBC:  Recent Labs Lab 11/12/15 1336  WBC 9.3  HGB 12.4  HCT 37.8  MCV 86.9  PLT 123XX123   Basic Metabolic Panel:  Recent Labs Lab 11/12/15 1336  NA 139  K 3.0*  CL 107  CO2 28  GLUCOSE 105*  BUN 16  CREATININE 0.81  CALCIUM 8.6*   Liver Function Tests:  Recent Labs Lab 11/12/15 1336  AST 36  ALT 25  ALKPHOS 103  BILITOT 0.9  PROT 6.6  ALBUMIN 3.9    Recent Labs Lab 11/12/15 1336  LIPASE 29   Urine  analysis:    Component Value Date/Time   COLORURINE YELLOW 08/22/2015 [place] 08/22/2015 1112   LABSPEC <1.005 (L) 08/22/2015 1112   PHURINE 5.5 08/22/2015 1112   GLUCOSEU NEGATIVE 08/22/2015 1112   HGBUR  NEGATIVE 08/22/2015 1112   BILIRUBINUR NEGATIVE 08/22/2015 1112   KETONESUR NEGATIVE 08/22/2015 1112   PROTEINUR NEGATIVE 08/22/2015 1112   UROBILINOGEN 0.2 11/16/2014 0939   NITRITE NEGATIVE 08/22/2015 1112   LEUKOCYTESUR TRACE (A) 08/22/2015 1112   EKG: Independently reviewed.   Assessment/Plan Principal Problem:   Upper GI bleed Active Problems:   Stroke ([place])   DM type 2 (diabetes mellitus, type 2) ([place])   Coronary artery disease   Essential hypertension  UGIB:  She could have MW tear, vs PUD, AVM or malignancy.  She is not bleeding briskly.  Will given PPI IV BID, and admit to SDU.   Will give her clear liquid.  She likely will get EGD in am, but will defer to GI.  Check H and H every 6 hours, Type and screen and transfuse as required.  DM:  Will given D5NS while NPO.  Use SSI.  HTN:  Hold BP meds in setting of potential GI bleed.   DVT prophylaxis: SCD. Code Status: FULL CODE.  Family Communication: None. Consults called: Dr Kela Millin.  Admission status: Inpatient to SDU.    Jahziah Simonin MD FACP.  [facility]  If 7PM-7AM, please contact night-coverage [email or web address] Password [facility]  11/12/2015, 4:51 PM

## 2015-11-12 NOTE — ED Notes (Signed)
Dr. Laural Golden in room with pt

## 2015-11-13 ENCOUNTER — Encounter (HOSPITAL_COMMUNITY)
Admission: EM | Disposition: A | Discharge status: Skilled Nursing Facility | Payer: Self-pay | Source: Home / Self Care | Attending: Pulmonary Disease

## 2015-11-13 DIAGNOSIS — K449 Diaphragmatic hernia without obstruction or gangrene: Secondary | ICD-10-CM

## 2015-11-13 DIAGNOSIS — K920 Hematemesis: Secondary | ICD-10-CM

## 2015-11-13 DIAGNOSIS — K31819 Angiodysplasia of stomach and duodenum without bleeding: Secondary | ICD-10-CM

## 2015-11-13 DIAGNOSIS — K766 Portal hypertension: Secondary | ICD-10-CM

## 2015-11-13 DIAGNOSIS — K3189 Other diseases of stomach and duodenum: Secondary | ICD-10-CM

## 2015-11-13 HISTORY — PX: ESOPHAGOGASTRODUODENOSCOPY: SHX5428

## 2015-11-13 LAB — CBC WITH DIFFERENTIAL/PLATELET
BASOS ABS: 0 10*3/uL (ref 0.0–0.1)
BASOS ABS: 0 10*3/uL (ref 0.0–0.1)
BASOS PCT: 0 %
BASOS PCT: 0 %
Basophils Absolute: 0 10*3/uL (ref 0.0–0.1)
Basophils Absolute: 0 10*3/uL (ref 0.0–0.1)
Basophils Relative: 0 %
Basophils Relative: 0 %
EOS ABS: 0 10*3/uL (ref 0.0–0.7)
EOS ABS: 0.1 10*3/uL (ref 0.0–0.7)
EOS ABS: 0.1 10*3/uL (ref 0.0–0.7)
EOS PCT: 1 %
EOS PCT: 1 %
EOS PCT: 1 %
EOS PCT: 2 %
Eosinophils Absolute: 0.1 10*3/uL (ref 0.0–0.7)
HCT: 33.5 % — ABNORMAL LOW (ref 36.0–46.0)
HCT: 34 % — ABNORMAL LOW (ref 36.0–46.0)
HCT: 34 % — ABNORMAL LOW (ref 36.0–46.0)
HCT: 34.7 % — ABNORMAL LOW (ref 36.0–46.0)
HEMOGLOBIN: 11.1 g/dL — AB (ref 12.0–15.0)
Hemoglobin: 11.1 g/dL — ABNORMAL LOW (ref 12.0–15.0)
Hemoglobin: 11.2 g/dL — ABNORMAL LOW (ref 12.0–15.0)
Hemoglobin: 11.4 g/dL — ABNORMAL LOW (ref 12.0–15.0)
LYMPHS ABS: 1.5 10*3/uL (ref 0.7–4.0)
LYMPHS PCT: 14 %
LYMPHS PCT: 22 %
LYMPHS PCT: 22 %
Lymphocytes Relative: 15 %
Lymphs Abs: 1.6 10*3/uL (ref 0.7–4.0)
Lymphs Abs: 1.1 10*3/uL (ref 0.7–4.0)
Lymphs Abs: 1.1 10*3/uL (ref 0.7–4.0)
MCH: 28.4 pg (ref 26.0–34.0)
MCH: 28.6 pg (ref 26.0–34.0)
MCH: 28.7 pg (ref 26.0–34.0)
MCH: 28.8 pg (ref 26.0–34.0)
MCHC: 32.9 g/dL (ref 30.0–36.0)
MCHC: 32.9 g/dL (ref 30.0–36.0)
MCHC: 32.6 g/dL (ref 30.0–36.0)
MCHC: 33.1 g/dL (ref 30.0–36.0)
MCV: 86.6 fL (ref 78.0–100.0)
MCV: 87 fL (ref 78.0–100.0)
MCV: 87 fL (ref 78.0–100.0)
MCV: 87.4 fL (ref 78.0–100.0)
MONO ABS: 0.8 10*3/uL (ref 0.1–1.0)
MONO ABS: 0.8 10*3/uL (ref 0.1–1.0)
MONO ABS: 0.9 10*3/uL (ref 0.1–1.0)
MONOS PCT: 12 %
Monocytes Absolute: 1 10*3/uL (ref 0.1–1.0)
Monocytes Relative: 11 %
Monocytes Relative: 12 %
Monocytes Relative: 12 %
NEUTROS ABS: 5.4 10*3/uL (ref 1.7–7.7)
NEUTROS PCT: 73 %
Neutro Abs: 4.4 10*3/uL (ref 1.7–7.7)
Neutro Abs: 4.9 10*3/uL (ref 1.7–7.7)
Neutro Abs: 5.7 10*3/uL (ref 1.7–7.7)
Neutrophils Relative %: 64 %
Neutrophils Relative %: 66 %
Neutrophils Relative %: 74 %
PLATELETS: 171 10*3/uL (ref 150–400)
PLATELETS: 173 10*3/uL (ref 150–400)
PLATELETS: 180 10*3/uL (ref 150–400)
PLATELETS: 183 10*3/uL (ref 150–400)
RBC: 3.87 MIL/uL (ref 3.87–5.11)
RBC: 3.89 MIL/uL (ref 3.87–5.11)
RBC: 3.91 MIL/uL (ref 3.87–5.11)
RBC: 3.99 MIL/uL (ref 3.87–5.11)
RDW: 13.8 % (ref 11.5–15.5)
RDW: 13.8 % (ref 11.5–15.5)
RDW: 13.9 % (ref 11.5–15.5)
RDW: 14.1 % (ref 11.5–15.5)
WBC: 6.9 10*3/uL (ref 4.0–10.5)
WBC: 7.3 10*3/uL (ref 4.0–10.5)
WBC: 7.4 10*3/uL (ref 4.0–10.5)
WBC: 7.8 10*3/uL (ref 4.0–10.5)

## 2015-11-13 LAB — MRSA PCR SCREENING: MRSA by PCR: NEGATIVE

## 2015-11-13 SURGERY — EGD (ESOPHAGOGASTRODUODENOSCOPY)
Anesthesia: Moderate Sedation

## 2015-11-13 MED ORDER — SODIUM CHLORIDE 0.9 % IV SOLN
INTRAVENOUS | Status: DC
  Administered 2015-11-13: 1000 mL via INTRAVENOUS

## 2015-11-13 MED ORDER — MIDAZOLAM HCL 5 MG/5ML IJ SOLN
INTRAMUSCULAR | Status: AC
  Filled 2015-11-13: qty 10

## 2015-11-13 MED ORDER — BUTAMBEN-TETRACAINE-BENZOCAINE 2-2-14 % EX AERO
INHALATION_SPRAY | CUTANEOUS | Status: AC
  Filled 2015-11-13: qty 20

## 2015-11-13 MED ORDER — STERILE WATER FOR IRRIGATION IR SOLN
Status: DC | PRN
  Administered 2015-11-13: 11:00:00

## 2015-11-13 MED ORDER — MEPERIDINE HCL 50 MG/ML IJ SOLN
INTRAMUSCULAR | Status: DC | PRN
  Administered 2015-11-13: 25 mg via INTRAVENOUS

## 2015-11-13 MED ORDER — MIDAZOLAM HCL 5 MG/5ML IJ SOLN
INTRAMUSCULAR | Status: DC | PRN
  Administered 2015-11-13: 0.5 mg via INTRAVENOUS
  Administered 2015-11-13: 2 mg via INTRAVENOUS
  Administered 2015-11-13: 0.5 mg via INTRAVENOUS

## 2015-11-13 MED ORDER — MEPERIDINE HCL 50 MG/ML IJ SOLN
INTRAMUSCULAR | Status: AC
  Filled 2015-11-13: qty 1

## 2015-11-13 MED ORDER — BUTAMBEN-TETRACAINE-BENZOCAINE 2-2-14 % EX AERO
INHALATION_SPRAY | CUTANEOUS | Status: DC | PRN
  Administered 2015-11-13: 2 via TOPICAL

## 2015-11-13 NOTE — Progress Notes (Signed)
Patient has no complaints. She enjoyed her lunch and did not get sick or experience abdominal pain. Hemoglobin is 11.2 g. Will switch to oral PPI tomorrow morning.

## 2015-11-13 NOTE — Progress Notes (Signed)
Subjective: She was admitted with GI bleeding. She's had nausea and coffee ground emesis. She had several episodes. She was brought to the hospital was found to be hemodynamically stable and admitted. She's had serial hemoglobin and hematocrits which have been fairly stable. No further bleeding. She is not complaining of any abdominal pain.  Objective: Vital signs in last 24 hours: Temp:  [97 F (36.1 C)-98.5 F (36.9 C)] 98.2 F (36.8 C) (08/24 0746) Pulse Rate:  [34-76] 76 (08/24 0700) Resp:  [18-22] 19 (08/24 0700) BP: (122-158)/(53-108) 152/78 (08/24 0700) SpO2:  [95 %-100 %] 97 % (08/24 0700) Weight:  [52.2 kg (115 lb)-52.7 kg (116 lb 2.9 oz)] 52.7 kg (116 lb 2.9 oz) (08/24 0500) Weight change:  Last BM Date: 11/12/15  Intake/Output from previous day: 08/23 0701 - 08/24 0700 In: 531.3 [I.V.:531.3] Out: 500 [Urine:500]  PHYSICAL EXAM General appearance: alert, cooperative and Mildly confused Resp: clear to auscultation bilaterally Cardio: irregularly irregular rhythm GI: soft, non-tender; bowel sounds normal; no masses,  no organomegaly Extremities: extremities normal, atraumatic, no cyanosis or edema  Lab Results:  Results for orders placed or performed during the hospital encounter of 11/12/15 (from the past 48 hour(s))  Lipase, blood     Status: None   Collection Time: 11/12/15  1:36 PM  Result Value Ref Range   Lipase 29 11 - 51 U/L  Comprehensive metabolic panel     Status: Abnormal   Collection Time: 11/12/15  1:36 PM  Result Value Ref Range   Sodium 139 135 - 145 mmol/L   Potassium 3.0 (L) 3.5 - 5.1 mmol/L   Chloride 107 101 - 111 mmol/L   CO2 28 22 - 32 mmol/L   Glucose, Bld 105 (H) 65 - 99 mg/dL   BUN 16 6 - 20 mg/dL   Creatinine, Ser 0.81 0.44 - 1.00 mg/dL   Calcium 8.6 (L) 8.9 - 10.3 mg/dL   Total Protein 6.6 6.5 - 8.1 g/dL   Albumin 3.9 3.5 - 5.0 g/dL   AST 36 15 - 41 U/L   ALT 25 14 - 54 U/L   Alkaline Phosphatase 103 38 - 126 U/L   Total Bilirubin  0.9 0.3 - 1.2 mg/dL   GFR calc non Af Amer >60 >60 mL/min   GFR calc Af Amer >60 >60 mL/min    Comment: (NOTE) The eGFR has been calculated using the CKD EPI equation. This calculation has not been validated in all clinical situations. eGFR's persistently <60 mL/min signify possible Chronic Kidney Disease.    Anion gap 4 (L) 5 - 15  CBC     Status: None   Collection Time: 11/12/15  1:36 PM  Result Value Ref Range   WBC 9.3 4.0 - 10.5 K/uL   RBC 4.35 3.87 - 5.11 MIL/uL   Hemoglobin 12.4 12.0 - 15.0 g/dL   HCT 37.8 36.0 - 46.0 %   MCV 86.9 78.0 - 100.0 fL   MCH 28.5 26.0 - 34.0 pg   MCHC 32.8 30.0 - 36.0 g/dL   RDW 13.8 11.5 - 15.5 %   Platelets 251 150 - 400 K/uL  POC occult blood, ED     Status: None   Collection Time: 11/12/15  3:15 PM  Result Value Ref Range   Fecal Occult Bld NEGATIVE NEGATIVE  Urinalysis, Routine w reflex microscopic     Status: Abnormal   Collection Time: 11/12/15  5:40 PM  Result Value Ref Range   Color, Urine YELLOW YELLOW   APPearance  CLEAR CLEAR   Specific Gravity, Urine 1.010 1.005 - 1.030   pH 6.0 5.0 - 8.0   Glucose, UA NEGATIVE NEGATIVE mg/dL   Hgb urine dipstick NEGATIVE NEGATIVE   Bilirubin Urine NEGATIVE NEGATIVE   Ketones, ur NEGATIVE NEGATIVE mg/dL   Protein, ur NEGATIVE NEGATIVE mg/dL   Nitrite POSITIVE (A) NEGATIVE   Leukocytes, UA SMALL (A) NEGATIVE  Urine microscopic-add on     Status: Abnormal   Collection Time: 11/12/15  5:40 PM  Result Value Ref Range   Squamous Epithelial / LPF 0-5 (A) NONE SEEN   WBC, UA TOO NUMEROUS TO COUNT 0 - 5 WBC/hpf   RBC / HPF NONE SEEN 0 - 5 RBC/hpf   Bacteria, UA MANY (A) NONE SEEN  MRSA PCR Screening     Status: None   Collection Time: 11/12/15  6:14 PM  Result Value Ref Range   MRSA by PCR NEGATIVE NEGATIVE    Comment:        The GeneXpert MRSA Assay (FDA approved for NASAL specimens only), is one component of a comprehensive MRSA colonization surveillance program. It is not intended to  diagnose MRSA infection nor to guide or monitor treatment for MRSA infections.   CBC WITH DIFFERENTIAL     Status: Abnormal   Collection Time: 11/12/15  7:12 PM  Result Value Ref Range   WBC 8.4 4.0 - 10.5 K/uL   RBC 4.03 3.87 - 5.11 MIL/uL   Hemoglobin 11.5 (L) 12.0 - 15.0 g/dL   HCT 35.1 (L) 36.0 - 46.0 %   MCV 87.1 78.0 - 100.0 fL   MCH 28.5 26.0 - 34.0 pg   MCHC 32.8 30.0 - 36.0 g/dL   RDW 13.9 11.5 - 15.5 %   Platelets 193 150 - 400 K/uL   Neutrophils Relative % 70 %   Neutro Abs 5.9 1.7 - 7.7 K/uL   Lymphocytes Relative 17 %   Lymphs Abs 1.5 0.7 - 4.0 K/uL   Monocytes Relative 12 %   Monocytes Absolute 1.0 0.1 - 1.0 K/uL   Eosinophils Relative 1 %   Eosinophils Absolute 0.1 0.0 - 0.7 K/uL   Basophils Relative 0 %   Basophils Absolute 0.0 0.0 - 0.1 K/uL  CBC WITH DIFFERENTIAL     Status: Abnormal   Collection Time: 11/13/15 12:40 AM  Result Value Ref Range   WBC 6.9 4.0 - 10.5 K/uL   RBC 3.87 3.87 - 5.11 MIL/uL   Hemoglobin 11.1 (L) 12.0 - 15.0 g/dL   HCT 33.5 (L) 36.0 - 46.0 %   MCV 86.6 78.0 - 100.0 fL   MCH 28.7 26.0 - 34.0 pg   MCHC 33.1 30.0 - 36.0 g/dL   RDW 13.9 11.5 - 15.5 %   Platelets 171 150 - 400 K/uL   Neutrophils Relative % 64 %   Neutro Abs 4.4 1.7 - 7.7 K/uL   Lymphocytes Relative 22 %   Lymphs Abs 1.5 0.7 - 4.0 K/uL   Monocytes Relative 12 %   Monocytes Absolute 0.9 0.1 - 1.0 K/uL   Eosinophils Relative 2 %   Eosinophils Absolute 0.1 0.0 - 0.7 K/uL   Basophils Relative 0 %   Basophils Absolute 0.0 0.0 - 0.1 K/uL  CBC WITH DIFFERENTIAL     Status: Abnormal   Collection Time: 11/13/15  6:32 AM  Result Value Ref Range   WBC 7.4 4.0 - 10.5 K/uL   RBC 3.99 3.87 - 5.11 MIL/uL   Hemoglobin 11.4 (L) 12.0 -  15.0 g/dL   HCT 34.7 (L) 36.0 - 46.0 %   MCV 87.0 78.0 - 100.0 fL   MCH 28.6 26.0 - 34.0 pg   MCHC 32.9 30.0 - 36.0 g/dL   RDW 13.8 11.5 - 15.5 %   Platelets 183 150 - 400 K/uL   Neutrophils Relative % 66 %   Neutro Abs 4.9 1.7 - 7.7 K/uL    Lymphocytes Relative 22 %   Lymphs Abs 1.6 0.7 - 4.0 K/uL   Monocytes Relative 11 %   Monocytes Absolute 0.8 0.1 - 1.0 K/uL   Eosinophils Relative 1 %   Eosinophils Absolute 0.1 0.0 - 0.7 K/uL   Basophils Relative 0 %   Basophils Absolute 0.0 0.0 - 0.1 K/uL    ABGS No results for input(s): PHART, PO2ART, TCO2, HCO3 in the last 72 hours.  Invalid input(s): PCO2 CULTURES Recent Results (from the past 240 hour(s))  MRSA PCR Screening     Status: None   Collection Time: 11/12/15  6:14 PM  Result Value Ref Range Status   MRSA by PCR NEGATIVE NEGATIVE Final    Comment:        The GeneXpert MRSA Assay (FDA approved for NASAL specimens only), is one component of a comprehensive MRSA colonization surveillance program. It is not intended to diagnose MRSA infection nor to guide or monitor treatment for MRSA infections.    Studies/Results: No results found.  Medications:  Prior to Admission:  Prescriptions Prior to Admission  Medication Sig Dispense Refill Last Dose  . acetaminophen (TYLENOL) 325 MG tablet Take 325 mg by mouth daily as needed for moderate pain.   11/12/2015 at Unknown time  . apixaban (ELIQUIS) 2.5 MG TABS tablet Take 1 tablet (2.5 mg total) by mouth 2 (two) times daily. 60 tablet 0 11/12/2015 at Unknown time  . atorvastatin (LIPITOR) 80 MG tablet Take 1 tablet (80 mg total) by mouth every morning. 30 tablet 12 11/12/2015 at Unknown time  . calcium carbonate (TUMS EX) 750 MG chewable tablet Chew 2 tablets by mouth daily.    11/12/2015 at Unknown time  . furosemide (LASIX) 20 MG tablet Take 1 tablet (20 mg total) by mouth daily. 90 tablet 3 11/12/2015 at Unknown time  . losartan (COZAAR) 50 MG tablet Take 1 tablet (50 mg total) by mouth daily. 90 tablet 3 11/12/2015 at Unknown time  . Methylcellulose, Laxative, (CITRUCEL) 500 MG TABS Take 1 tablet by mouth daily.    11/12/2015 at Unknown time  . metoprolol (LOPRESSOR) 50 MG tablet Take 50 mg by mouth 2 (two) times daily.     11/12/2015 at Unknown time  . Multiple Vitamin (MULTIVITAMIN WITH MINERALS) TABS Take 1 tablet by mouth every morning. *Contains NO Iron (Ferrous Sulfate and/or Iodine)*   11/12/2015 at Unknown time  . omeprazole (PRILOSEC) 20 MG capsule Take 20 mg by mouth every morning.    11/12/2015 at Unknown time  . oxybutynin (DITROPAN) 5 MG tablet Take 0.5-1 tablets by mouth 2 (two) times daily. May increase 1 tablet 3 times daily if needed.   11/12/2015 at Unknown time  . trimethoprim (TRIMPEX) 100 MG tablet Take 100 mg by mouth at bedtime.   11/11/2015 at Unknown time  . vitamin C (ASCORBIC ACID) 500 MG tablet Take 500 mg by mouth every morning.    11/12/2015 at Unknown time   Scheduled: . atorvastatin  80 mg Oral q1800  . pantoprazole (PROTONIX) IV  40 mg Intravenous Q12H  . sodium chloride flush  3 mL Intravenous Q12H   Continuous: . dextrose 5 % and 0.9 % NaCl with KCl 40 mEq/L 75 mL/hr at 11/13/15 0720   ULA:[id number]  Assesment:She has upper GI bleeding. She has been hemodynamically stable and her hemoglobin level is stable so far. She has multiple other medical problems including chronic atrial fibrillation on chronic anticoagulation multiple strokes and TIAs, hypertension, diabetes mellitus, coronary artery occlusive disease, history of breast cancer and urinary incontinence from overactive bladder Principal Problem:   Upper GI bleed Active Problems:   Stroke ([place])   DM type 2 (diabetes mellitus, type 2) ([facility])   Coronary artery disease   Essential hypertension    Plan: Continue IV Protonix. She is set for EGD later today.    LOS: 1 day   Karmon Andis L 11/13/2015, 8:03 AM

## 2015-11-13 NOTE — Op Note (Signed)
[facility] Patient Name: Caroline Aguirre Procedure Date: 11/13/2015 10:43 AM MRN: [id number] Date of Birth: 1924/07/23 Attending MD: Hildred Laser , MD CSN: [id number] Age: 82 Admit Type: Inpatient Procedure:                Upper GI endoscopy Indications:              Coffee-ground emesis Providers:                Hildred Laser, MD, Rosina Lowenstein, RN, Purcell Nails.                            Tina Griffiths, Technician Referring MD:             Jasper Loser. Luan Pulling, MD Medicines:                Cetacaine spray, Midazolam 3 mg IV Complications:            No immediate complications. Estimated Blood Loss:     Estimated blood loss was minimal. Procedure:                Pre-Anesthesia Assessment:                           - Prior to the procedure, a History and Physical                            was performed, and patient medications and                            allergies were reviewed. The patient's tolerance of                            previous anesthesia was also reviewed. The risks                            and benefits of the procedure and the sedation                            options and risks were discussed with the patient.                            All questions were answered, and informed consent                            was obtained. Prior Anticoagulants: The patient                            last took Eliquis (apixaban) 1 day and Plavix                            (clopidogrel) 1 day prior to the procedure. ASA                            Grade Assessment: III - A patient with severe  systemic disease. After reviewing the risks and                            benefits, the patient was deemed in satisfactory                            condition to undergo the procedure.                           After obtaining informed consent, the endoscope was                            passed under direct vision. Throughout the                            procedure,  the patient's blood pressure, pulse, and                            oxygen saturations were monitored continuously. The                            [id number]) scope was introduced through the                            mouth, and advanced to the second part of duodenum.                            The upper GI endoscopy was accomplished without                            difficulty. The patient tolerated the procedure                            well. Scope In: 10:51:42 AM Scope Out: 11:05:52 AM Total Procedure Duration: 0 hours 14 minutes 10 seconds  Findings:      The examined esophagus was normal.      The Z-line was regular and was found 37 cm from the incisors.      A 2 cm hiatal hernia was present.      Mild portal hypertensive gastropathy was found in the gastric fundus and       in the gastric body.      A single 3 mm no bleeding angiodysplastic lesion was found in the       gastric fundus. Coagulation for bleeding prevention using argon plasma       at 0.4 liters/minute and 30 watts and argon plasma was unsuccessful. To       stop active bleeding, three hemostatic clips were successfully placed       (MR conditional). There was no bleeding at the end of the procedure.      The duodenal bulb and second portion of the duodenum were normal. Impression:               - Normal esophagus.                           -  Z-line regular, 37 cm from the incisors.                           - 2 cm hiatal hernia.                           - Portal hypertensive gastropathy.                           - A single non-bleeding angiodysplastic lesion in                            the stomach. Treatment not successful with argon                            plasma coagulation (APC). Clips (MR conditional)                            were placed with hemostasis.                           - Normal duodenal bulb and second portion of the                            duodenum.                            - No specimens collected. Moderate Sedation:      Moderate (conscious) sedation was administered by the endoscopy nurse       and supervised by the endoscopist. The following parameters were       monitored: oxygen saturation, heart rate, blood pressure, CO2       capnography and response to care. Total physician intraservice time was       19 minutes. Recommendation:           - Return patient to ICU for ongoing care.                           - Full liquid diet.                           - Resume Eliquis (apixaban) in 2 days at prior dose. Procedure Code(s):        --- Professional ---                           [phone], Esophagogastroduodenoscopy, flexible,                            transoral; with control of bleeding, any method                           99152, Moderate sedation services provided by the                            same physician or other qualified health care  professional performing the diagnostic or                            therapeutic service that the sedation supports,                            requiring the presence of an independent trained                            observer to assist in the monitoring of the                            patient's level of consciousness and physiological                            status; initial 15 minutes of intraservice time,                            patient age 45 years or older Diagnosis Code(s):        --- Professional ---                           K44.9, Diaphragmatic hernia without obstruction or                            gangrene                           K76.6, Portal hypertension                           K31.89, Other diseases of stomach and duodenum                           K31.819, Angiodysplasia of stomach and duodenum                            without bleeding                           K92.0, Hematemesis CPT copyright 2016 [employer]. All rights reserved. The  codes documented in this report are preliminary and upon coder review may  be revised to meet current compliance requirements. Hildred Laser, MD Hildred Laser, MD 11/13/2015 11:19:09 AM This report has been signed electronically. Number of Addenda: 0

## 2015-11-14 DIAGNOSIS — K766 Portal hypertension: Secondary | ICD-10-CM

## 2015-11-14 DIAGNOSIS — K3189 Other diseases of stomach and duodenum: Secondary | ICD-10-CM

## 2015-11-14 DIAGNOSIS — K31819 Angiodysplasia of stomach and duodenum without bleeding: Secondary | ICD-10-CM

## 2015-11-14 DIAGNOSIS — K449 Diaphragmatic hernia without obstruction or gangrene: Secondary | ICD-10-CM

## 2015-11-14 LAB — CBC WITH DIFFERENTIAL/PLATELET
BASOS ABS: 0 10*3/uL (ref 0.0–0.1)
BASOS PCT: 0 %
EOS ABS: 0.1 10*3/uL (ref 0.0–0.7)
EOS PCT: 1 %
HCT: 32.9 % — ABNORMAL LOW (ref 36.0–46.0)
Hemoglobin: 10.8 g/dL — ABNORMAL LOW (ref 12.0–15.0)
LYMPHS PCT: 14 %
Lymphs Abs: 1 10*3/uL (ref 0.7–4.0)
MCH: 28.5 pg (ref 26.0–34.0)
MCHC: 32.8 g/dL (ref 30.0–36.0)
MCV: 86.8 fL (ref 78.0–100.0)
MONO ABS: 1 10*3/uL (ref 0.1–1.0)
Monocytes Relative: 14 %
Neutro Abs: 5 10*3/uL (ref 1.7–7.7)
Neutrophils Relative %: 71 %
PLATELETS: 170 10*3/uL (ref 150–400)
RBC: 3.79 MIL/uL — ABNORMAL LOW (ref 3.87–5.11)
RDW: 13.8 % (ref 11.5–15.5)
WBC: 7 10*3/uL (ref 4.0–10.5)

## 2015-11-14 MED ORDER — OXYBUTYNIN CHLORIDE 5 MG PO TABS
2.5000 mg | ORAL_TABLET | Freq: Three times a day (TID) | ORAL | Status: DC
  Administered 2015-11-14: 5 mg via ORAL
  Administered 2015-11-14 – 2015-11-19 (×10): 2.5 mg via ORAL
  Filled 2015-11-14 (×12): qty 1

## 2015-11-14 MED ORDER — PANTOPRAZOLE SODIUM 40 MG PO TBEC
40.0000 mg | DELAYED_RELEASE_TABLET | Freq: Two times a day (BID) | ORAL | Status: DC
  Administered 2015-11-14 – 2015-11-19 (×9): 40 mg via ORAL
  Filled 2015-11-14 (×11): qty 1

## 2015-11-14 MED ORDER — LOSARTAN POTASSIUM 50 MG PO TABS
50.0000 mg | ORAL_TABLET | Freq: Every day | ORAL | Status: DC
  Administered 2015-11-14 – 2015-11-19 (×5): 50 mg via ORAL
  Filled 2015-11-14 (×7): qty 1

## 2015-11-14 MED ORDER — METOPROLOL TARTRATE 50 MG PO TABS
50.0000 mg | ORAL_TABLET | Freq: Two times a day (BID) | ORAL | Status: DC
  Administered 2015-11-14 – 2015-11-19 (×9): 50 mg via ORAL
  Filled 2015-11-14 (×11): qty 1

## 2015-11-14 NOTE — Progress Notes (Signed)
Subjective: She feels better but is confused. Her heart rate is up but she's not had her metoprolol. She is otherwise hemodynamically stable. Her hemoglobin level has been stable. No further bleeding has been noted.  Objective: Vital signs in last 24 hours: Temp:  [97 F (36.1 C)-98.6 F (37 C)] 98.1 F (36.7 C) (08/25 0400) Pulse Rate:  [68-155] 139 (08/25 0600) Resp:  [17-30] 22 (08/25 0600) BP: (113-167)/(57-114) 145/67 (08/25 0500) SpO2:  [96 %-100 %] 97 % (08/25 0600) Weight:  [53.7 kg (118 lb 6.2 oz)] 53.7 kg (118 lb 6.2 oz) (08/25 0500) Weight change: 1.536 kg (3 lb 6.2 oz) Last BM Date: 11/12/15  Intake/Output from previous day: 08/24 0701 - 08/25 0700 In: 2640 [P.O.:340; I.V.:2300] Out: 1600 [Urine:1600]  PHYSICAL EXAM General appearance: alert, cooperative, mild distress and Confused Resp: clear to auscultation bilaterally Cardio: irregularly irregular rhythm GI: soft, non-tender; bowel sounds normal; no masses,  no organomegaly Extremities: extremities normal, atraumatic, no cyanosis or edema  Lab Results:  Results for orders placed or performed during the hospital encounter of 11/12/15 (from the past 48 hour(s))  Lipase, blood     Status: None   Collection Time: 11/12/15  1:36 PM  Result Value Ref Range   Lipase 29 11 - 51 U/L  Comprehensive metabolic panel     Status: Abnormal   Collection Time: 11/12/15  1:36 PM  Result Value Ref Range   Sodium 139 135 - 145 mmol/L   Potassium 3.0 (L) 3.5 - 5.1 mmol/L   Chloride 107 101 - 111 mmol/L   CO2 28 22 - 32 mmol/L   Glucose, Bld 105 (H) 65 - 99 mg/dL   BUN 16 6 - 20 mg/dL   Creatinine, Ser 0.81 0.44 - 1.00 mg/dL   Calcium 8.6 (L) 8.9 - 10.3 mg/dL   Total Protein 6.6 6.5 - 8.1 g/dL   Albumin 3.9 3.5 - 5.0 g/dL   AST 36 15 - 41 U/L   ALT 25 14 - 54 U/L   Alkaline Phosphatase 103 38 - 126 U/L   Total Bilirubin 0.9 0.3 - 1.2 mg/dL   GFR calc non Af Amer >60 >60 mL/min   GFR calc Af Amer >60 >60 mL/min     Comment: (NOTE) The eGFR has been calculated using the CKD EPI equation. This calculation has not been validated in all clinical situations. eGFR's persistently <60 mL/min signify possible Chronic Kidney Disease.    Anion gap 4 (L) 5 - 15  CBC     Status: None   Collection Time: 11/12/15  1:36 PM  Result Value Ref Range   WBC 9.3 4.0 - 10.5 K/uL   RBC 4.35 3.87 - 5.11 MIL/uL   Hemoglobin 12.4 12.0 - 15.0 g/dL   HCT 37.8 36.0 - 46.0 %   MCV 86.9 78.0 - 100.0 fL   MCH 28.5 26.0 - 34.0 pg   MCHC 32.8 30.0 - 36.0 g/dL   RDW 13.8 11.5 - 15.5 %   Platelets 251 150 - 400 K/uL  POC occult blood, ED     Status: None   Collection Time: 11/12/15  3:15 PM  Result Value Ref Range   Fecal Occult Bld NEGATIVE NEGATIVE  Urinalysis, Routine w reflex microscopic     Status: Abnormal   Collection Time: 11/12/15  5:40 PM  Result Value Ref Range   Color, Urine YELLOW YELLOW   APPearance CLEAR CLEAR   Specific Gravity, Urine 1.010 1.005 - 1.030   pH 6.0  5.0 - 8.0   Glucose, UA NEGATIVE NEGATIVE mg/dL   Hgb urine dipstick NEGATIVE NEGATIVE   Bilirubin Urine NEGATIVE NEGATIVE   Ketones, ur NEGATIVE NEGATIVE mg/dL   Protein, ur NEGATIVE NEGATIVE mg/dL   Nitrite POSITIVE (A) NEGATIVE   Leukocytes, UA SMALL (A) NEGATIVE  Urine microscopic-add on     Status: Abnormal   Collection Time: 11/12/15  5:40 PM  Result Value Ref Range   Squamous Epithelial / LPF 0-5 (A) NONE SEEN   WBC, UA TOO NUMEROUS TO COUNT 0 - 5 WBC/hpf   RBC / HPF NONE SEEN 0 - 5 RBC/hpf   Bacteria, UA MANY (A) NONE SEEN  MRSA PCR Screening     Status: None   Collection Time: 11/12/15  6:14 PM  Result Value Ref Range   MRSA by PCR NEGATIVE NEGATIVE    Comment:        The GeneXpert MRSA Assay (FDA approved for NASAL specimens only), is one component of a comprehensive MRSA colonization surveillance program. It is not intended to diagnose MRSA infection nor to guide or monitor treatment for MRSA infections.   CBC WITH  DIFFERENTIAL     Status: Abnormal   Collection Time: 11/12/15  7:12 PM  Result Value Ref Range   WBC 8.4 4.0 - 10.5 K/uL   RBC 4.03 3.87 - 5.11 MIL/uL   Hemoglobin 11.5 (L) 12.0 - 15.0 g/dL   HCT 96.5 (L) 65.9 - 94.3 %   MCV 87.1 78.0 - 100.0 fL   MCH 28.5 26.0 - 34.0 pg   MCHC 32.8 30.0 - 36.0 g/dL   RDW 71.9 07.0 - 72.1 %   Platelets 193 150 - 400 K/uL   Neutrophils Relative % 70 %   Neutro Abs 5.9 1.7 - 7.7 K/uL   Lymphocytes Relative 17 %   Lymphs Abs 1.5 0.7 - 4.0 K/uL   Monocytes Relative 12 %   Monocytes Absolute 1.0 0.1 - 1.0 K/uL   Eosinophils Relative 1 %   Eosinophils Absolute 0.1 0.0 - 0.7 K/uL   Basophils Relative 0 %   Basophils Absolute 0.0 0.0 - 0.1 K/uL  CBC WITH DIFFERENTIAL     Status: Abnormal   Collection Time: 11/13/15 12:40 AM  Result Value Ref Range   WBC 6.9 4.0 - 10.5 K/uL   RBC 3.87 3.87 - 5.11 MIL/uL   Hemoglobin 11.1 (L) 12.0 - 15.0 g/dL   HCT 71.1 (L) 65.4 - 61.2 %   MCV 86.6 78.0 - 100.0 fL   MCH 28.7 26.0 - 34.0 pg   MCHC 33.1 30.0 - 36.0 g/dL   RDW 43.2 75.5 - 62.3 %   Platelets 171 150 - 400 K/uL   Neutrophils Relative % 64 %   Neutro Abs 4.4 1.7 - 7.7 K/uL   Lymphocytes Relative 22 %   Lymphs Abs 1.5 0.7 - 4.0 K/uL   Monocytes Relative 12 %   Monocytes Absolute 0.9 0.1 - 1.0 K/uL   Eosinophils Relative 2 %   Eosinophils Absolute 0.1 0.0 - 0.7 K/uL   Basophils Relative 0 %   Basophils Absolute 0.0 0.0 - 0.1 K/uL  CBC WITH DIFFERENTIAL     Status: Abnormal   Collection Time: 11/13/15  6:32 AM  Result Value Ref Range   WBC 7.4 4.0 - 10.5 K/uL   RBC 3.99 3.87 - 5.11 MIL/uL   Hemoglobin 11.4 (L) 12.0 - 15.0 g/dL   HCT 92.1 (L) 51.5 - 82.6 %   MCV 87.0  78.0 - 100.0 fL   MCH 28.6 26.0 - 34.0 pg   MCHC 32.9 30.0 - 36.0 g/dL   RDW 13.8 11.5 - 15.5 %   Platelets 183 150 - 400 K/uL   Neutrophils Relative % 66 %   Neutro Abs 4.9 1.7 - 7.7 K/uL   Lymphocytes Relative 22 %   Lymphs Abs 1.6 0.7 - 4.0 K/uL   Monocytes Relative 11 %    Monocytes Absolute 0.8 0.1 - 1.0 K/uL   Eosinophils Relative 1 %   Eosinophils Absolute 0.1 0.0 - 0.7 K/uL   Basophils Relative 0 %   Basophils Absolute 0.0 0.0 - 0.1 K/uL  CBC WITH DIFFERENTIAL     Status: Abnormal   Collection Time: 11/13/15 12:26 PM  Result Value Ref Range   WBC 7.3 4.0 - 10.5 K/uL   RBC 3.89 3.87 - 5.11 MIL/uL   Hemoglobin 11.2 (L) 12.0 - 15.0 g/dL   HCT 34.0 (L) 36.0 - 46.0 %   MCV 87.4 78.0 - 100.0 fL   MCH 28.8 26.0 - 34.0 pg   MCHC 32.9 30.0 - 36.0 g/dL   RDW 13.8 11.5 - 15.5 %   Platelets 173 150 - 400 K/uL   Neutrophils Relative % 74 %   Neutro Abs 5.4 1.7 - 7.7 K/uL   Lymphocytes Relative 15 %   Lymphs Abs 1.1 0.7 - 4.0 K/uL   Monocytes Relative 12 %   Monocytes Absolute 0.8 0.1 - 1.0 K/uL   Eosinophils Relative 1 %   Eosinophils Absolute 0.0 0.0 - 0.7 K/uL   Basophils Relative 0 %   Basophils Absolute 0.0 0.0 - 0.1 K/uL  CBC WITH DIFFERENTIAL     Status: Abnormal   Collection Time: 11/13/15  6:56 PM  Result Value Ref Range   WBC 7.8 4.0 - 10.5 K/uL   RBC 3.91 3.87 - 5.11 MIL/uL   Hemoglobin 11.1 (L) 12.0 - 15.0 g/dL   HCT 34.0 (L) 36.0 - 46.0 %   MCV 87.0 78.0 - 100.0 fL   MCH 28.4 26.0 - 34.0 pg   MCHC 32.6 30.0 - 36.0 g/dL   RDW 14.1 11.5 - 15.5 %   Platelets 180 150 - 400 K/uL   Neutrophils Relative % 73 %   Neutro Abs 5.7 1.7 - 7.7 K/uL   Lymphocytes Relative 14 %   Lymphs Abs 1.1 0.7 - 4.0 K/uL   Monocytes Relative 12 %   Monocytes Absolute 1.0 0.1 - 1.0 K/uL   Eosinophils Relative 1 %   Eosinophils Absolute 0.1 0.0 - 0.7 K/uL   Basophils Relative 0 %   Basophils Absolute 0.0 0.0 - 0.1 K/uL  CBC WITH DIFFERENTIAL     Status: Abnormal   Collection Time: 11/14/15 12:34 AM  Result Value Ref Range   WBC 7.0 4.0 - 10.5 K/uL   RBC 3.79 (L) 3.87 - 5.11 MIL/uL   Hemoglobin 10.8 (L) 12.0 - 15.0 g/dL   HCT 32.9 (L) 36.0 - 46.0 %   MCV 86.8 78.0 - 100.0 fL   MCH 28.5 26.0 - 34.0 pg   MCHC 32.8 30.0 - 36.0 g/dL   RDW 13.8 11.5 - 15.5 %    Platelets 170 150 - 400 K/uL   Neutrophils Relative % 71 %   Neutro Abs 5.0 1.7 - 7.7 K/uL   Lymphocytes Relative 14 %   Lymphs Abs 1.0 0.7 - 4.0 K/uL   Monocytes Relative 14 %   Monocytes Absolute 1.0 0.1 -  1.0 K/uL   Eosinophils Relative 1 %   Eosinophils Absolute 0.1 0.0 - 0.7 K/uL   Basophils Relative 0 %   Basophils Absolute 0.0 0.0 - 0.1 K/uL    ABGS No results for input(s): PHART, PO2ART, TCO2, HCO3 in the last 72 hours.  Invalid input(s): PCO2 CULTURES Recent Results (from the past 240 hour(s))  MRSA PCR Screening     Status: None   Collection Time: 11/12/15  6:14 PM  Result Value Ref Range Status   MRSA by PCR NEGATIVE NEGATIVE Final    Comment:        The GeneXpert MRSA Assay (FDA approved for NASAL specimens only), is one component of a comprehensive MRSA colonization surveillance program. It is not intended to diagnose MRSA infection nor to guide or monitor treatment for MRSA infections.    Studies/Results: No results found.  Medications:  Prior to Admission:  Prescriptions Prior to Admission  Medication Sig Dispense Refill Last Dose  . acetaminophen (TYLENOL) 325 MG tablet Take 325 mg by mouth daily as needed for moderate pain.   11/12/2015 at Unknown time  . apixaban (ELIQUIS) 2.5 MG TABS tablet Take 1 tablet (2.5 mg total) by mouth 2 (two) times daily. 60 tablet 0 11/12/2015 at Unknown time  . atorvastatin (LIPITOR) 80 MG tablet Take 1 tablet (80 mg total) by mouth every morning. 30 tablet 12 11/12/2015 at Unknown time  . calcium carbonate (TUMS EX) 750 MG chewable tablet Chew 2 tablets by mouth daily.    11/12/2015 at Unknown time  . furosemide (LASIX) 20 MG tablet Take 1 tablet (20 mg total) by mouth daily. 90 tablet 3 11/12/2015 at Unknown time  . losartan (COZAAR) 50 MG tablet Take 1 tablet (50 mg total) by mouth daily. 90 tablet 3 11/12/2015 at Unknown time  . Methylcellulose, Laxative, (CITRUCEL) 500 MG TABS Take 1 tablet by mouth daily.    11/12/2015  at Unknown time  . metoprolol (LOPRESSOR) 50 MG tablet Take 50 mg by mouth 2 (two) times daily.    11/12/2015 at Unknown time  . Multiple Vitamin (MULTIVITAMIN WITH MINERALS) TABS Take 1 tablet by mouth every morning. *Contains NO Iron (Ferrous Sulfate and/or Iodine)*   11/12/2015 at Unknown time  . omeprazole (PRILOSEC) 20 MG capsule Take 20 mg by mouth every morning.    11/12/2015 at Unknown time  . oxybutynin (DITROPAN) 5 MG tablet Take 0.5-1 tablets by mouth 2 (two) times daily. May increase 1 tablet 3 times daily if needed.   11/12/2015 at Unknown time  . trimethoprim (TRIMPEX) 100 MG tablet Take 100 mg by mouth at bedtime.   11/11/2015 at Unknown time  . vitamin C (ASCORBIC ACID) 500 MG tablet Take 500 mg by mouth every morning.    11/12/2015 at Unknown time   Scheduled: . atorvastatin  80 mg Oral q1800  . losartan  50 mg Oral Daily  . metoprolol  50 mg Oral BID  . pantoprazole  40 mg Oral BID  . sodium chloride flush  3 mL Intravenous Q12H   Continuous: . dextrose 5 % and 0.9 % NaCl with KCl 40 mEq/L 75 mL/hr at 11/13/15 2107   MPN:[id number]  Assesment:She was admitted with upper GI bleeding which seems to have stopped. She had EGD yesterday and results are noted. She has atrial fib with rapid ventricular response but has not received metoprolol so I will plan to start her on that and hopefully get her moved to a telemetry bed today. She is  confused and I think she'll do better out of the intensive care unit Principal Problem:   Upper GI bleed Active Problems:   Stroke ([place])   DM type 2 (diabetes mellitus, type 2) ([place])   Coronary artery disease   Essential hypertension    Plan: As above recheck CBC tomorrow. Transition to oral pantoprazole. Restart metoprolol and losartan    LOS: 2 days   Serafino Burciaga L 11/14/2015, 7:43 AM

## 2015-11-14 NOTE — Progress Notes (Signed)
  Subjective:  Patient denies nausea vomiting abdominal pain. No melena reported per nursing staff.   Objective: Blood pressure (!) 145/67, pulse (!) 139, temperature 97.8 F (36.6 C), temperature source Oral, resp. rate (!) 22, height 5\' 3"  (1.6 m), weight 118 lb 6.2 oz (53.7 kg), SpO2 97 %. Patient is confused but she follows simple commands correctly. Abdomen is soft and nontender. No LE edema or clubbing noted.  Labs/studies Results:   Recent Labs  11/13/15 1226 11/13/15 1856 11/14/15 0034  WBC 7.3 7.8 7.0  HGB 11.2* 11.1* 10.8*  HCT 34.0* 34.0* 32.9*  PLT 173 180 170    BMET   Recent Labs  11/12/15 1336  NA 139  K 3.0*  CL 107  CO2 28  GLUCOSE 105*  BUN 16  CREATININE 0.81  CALCIUM 8.6*    LFT   Recent Labs  11/12/15 1336  PROT 6.6  ALBUMIN 3.9  AST 36  ALT 25  ALKPHOS 103  BILITOT 0.9     Assessment:  #1. Upper GI bleed secondary to gastric fundal AV malformation. Status post therapeutic EGD yesterday. No evidence of recurrent bleed. H&H is stable. #2. Confusion secondary to hospitalization in an elderly patient.  Recommendations:  Can resume anticoagulant and or antiplatelet agent tomorrow. Continue PPI and twice a day schedule for 4 weeks and thereafter once daily.  Will sign off.

## 2015-11-14 NOTE — Care Management Important Message (Signed)
Important Message  Patient Details  Name: SHEALIN ELIJAH MRN: [id number] Date of Birth: 1924/04/04   Medicare Important Message Given:  Yes    Tadan Shill, Chauncey [place], RN 11/14/2015, 2:54 PM

## 2015-11-15 LAB — CBC WITH DIFFERENTIAL/PLATELET
Basophils Absolute: 0 10*3/uL (ref 0.0–0.1)
Basophils Relative: 0 %
EOS ABS: 0 10*3/uL (ref 0.0–0.7)
EOS PCT: 0 %
HCT: 35.4 % — ABNORMAL LOW (ref 36.0–46.0)
Hemoglobin: 11.3 g/dL — ABNORMAL LOW (ref 12.0–15.0)
LYMPHS ABS: 0.4 10*3/uL — AB (ref 0.7–4.0)
Lymphocytes Relative: 4 %
MCH: 28.1 pg (ref 26.0–34.0)
MCHC: 31.9 g/dL (ref 30.0–36.0)
MCV: 88.1 fL (ref 78.0–100.0)
MONOS PCT: 12 %
Monocytes Absolute: 1.2 10*3/uL — ABNORMAL HIGH (ref 0.1–1.0)
Neutro Abs: 8.4 10*3/uL — ABNORMAL HIGH (ref 1.7–7.7)
Neutrophils Relative %: 84 %
PLATELETS: 196 10*3/uL (ref 150–400)
RBC: 4.02 MIL/uL (ref 3.87–5.11)
RDW: 13.8 % (ref 11.5–15.5)
WBC: 10 10*3/uL (ref 4.0–10.5)

## 2015-11-15 MED ORDER — LEVALBUTEROL HCL 0.63 MG/3ML IN NEBU
0.6300 mg | INHALATION_SOLUTION | Freq: Four times a day (QID) | RESPIRATORY_TRACT | Status: DC
  Administered 2015-11-15 – 2015-11-18 (×11): 0.63 mg via RESPIRATORY_TRACT
  Filled 2015-11-15 (×11): qty 3

## 2015-11-15 MED ORDER — LORAZEPAM 2 MG/ML IJ SOLN
1.0000 mg | Freq: Once | INTRAMUSCULAR | Status: AC
  Administered 2015-11-15: 1 mg via INTRAVENOUS
  Filled 2015-11-15: qty 1

## 2015-11-15 MED ORDER — APIXABAN 2.5 MG PO TABS
2.5000 mg | ORAL_TABLET | Freq: Two times a day (BID) | ORAL | Status: DC
  Administered 2015-11-16 – 2015-11-19 (×7): 2.5 mg via ORAL
  Filled 2015-11-15 (×9): qty 1

## 2015-11-15 MED ORDER — METOPROLOL TARTRATE 5 MG/5ML IV SOLN
5.0000 mg | Freq: Once | INTRAVENOUS | Status: AC
  Administered 2015-11-15: 5 mg via INTRAVENOUS
  Filled 2015-11-15: qty 5

## 2015-11-15 MED ORDER — LEVALBUTEROL HCL 0.63 MG/3ML IN NEBU
INHALATION_SOLUTION | RESPIRATORY_TRACT | Status: AC
  Administered 2015-11-15: 0.63 mg via RESPIRATORY_TRACT
  Filled 2015-11-15: qty 3

## 2015-11-15 MED ORDER — LORAZEPAM 2 MG/ML IJ SOLN
0.5000 mg | INTRAMUSCULAR | Status: DC | PRN
Start: 2015-11-15 — End: 2015-11-18
  Administered 2015-11-15 – 2015-11-17 (×4): 0.5 mg via INTRAVENOUS
  Filled 2015-11-15 (×5): qty 1

## 2015-11-15 MED ORDER — ALBUTEROL SULFATE (5 MG/ML) 0.5% IN NEBU: 2.5000 mg | INHALATION_SOLUTION | Freq: Four times a day (QID) | RESPIRATORY_TRACT | Status: DC

## 2015-11-15 NOTE — Progress Notes (Signed)
Pt extremely agitated and restless. Ativan 0.5 mg was administered to patient. Will continue to assess and monitor patient throughout the shift.

## 2015-11-15 NOTE — Progress Notes (Signed)
Subjective: She's very confused and agitated. She has a [employer] cabin [employer]. She does not remember any of the events and doesn't remember seeing me for the last 2 days. No more bleeding.  Objective: Vital signs in last 24 hours: Temp:  [97.5 F (36.4 C)-97.8 F (36.6 C)] 97.5 F (36.4 C) (08/26 0550) Pulse Rate:  [88-107] 107 (08/26 0550) Resp:  [20-22] 22 (08/26 0550) BP: (143-147)/(68-83) 144/75 (08/26 0550) SpO2:  [98 %-100 %] 100 % (08/26 0550) Weight change:  Last BM Date: 11/14/15  Intake/Output from previous day: 08/25 0701 - 08/26 0700 In: 480 [P.O.:480] Out: 1150 [Urine:1150]  PHYSICAL EXAM General appearance: alert and Agitated Resp: clear to auscultation bilaterally Cardio: irregularly irregular rhythm GI: soft, non-tender; bowel sounds normal; no masses,  no organomegaly Extremities: extremities normal, atraumatic, no cyanosis or edema  Lab Results:  Results for orders placed or performed during the hospital encounter of 11/12/15 (from the past 48 hour(s))  CBC WITH DIFFERENTIAL     Status: Abnormal   Collection Time: 11/13/15 12:26 PM  Result Value Ref Range   WBC 7.3 4.0 - 10.5 K/uL   RBC 3.89 3.87 - 5.11 MIL/uL   Hemoglobin 11.2 (L) 12.0 - 15.0 g/dL   HCT 34.0 (L) 36.0 - 46.0 %   MCV 87.4 78.0 - 100.0 fL   MCH 28.8 26.0 - 34.0 pg   MCHC 32.9 30.0 - 36.0 g/dL   RDW 13.8 11.5 - 15.5 %   Platelets 173 150 - 400 K/uL   Neutrophils Relative % 74 %   Neutro Abs 5.4 1.7 - 7.7 K/uL   Lymphocytes Relative 15 %   Lymphs Abs 1.1 0.7 - 4.0 K/uL   Monocytes Relative 12 %   Monocytes Absolute 0.8 0.1 - 1.0 K/uL   Eosinophils Relative 1 %   Eosinophils Absolute 0.0 0.0 - 0.7 K/uL   Basophils Relative 0 %   Basophils Absolute 0.0 0.0 - 0.1 K/uL  CBC WITH DIFFERENTIAL     Status: Abnormal   Collection Time: 11/13/15  6:56 PM  Result Value Ref Range   WBC 7.8 4.0 - 10.5 K/uL   RBC 3.91 3.87 - 5.11 MIL/uL   Hemoglobin 11.1 (L) 12.0 - 15.0 g/dL   HCT 34.0 (L) 36.0 -  46.0 %   MCV 87.0 78.0 - 100.0 fL   MCH 28.4 26.0 - 34.0 pg   MCHC 32.6 30.0 - 36.0 g/dL   RDW 14.1 11.5 - 15.5 %   Platelets 180 150 - 400 K/uL   Neutrophils Relative % 73 %   Neutro Abs 5.7 1.7 - 7.7 K/uL   Lymphocytes Relative 14 %   Lymphs Abs 1.1 0.7 - 4.0 K/uL   Monocytes Relative 12 %   Monocytes Absolute 1.0 0.1 - 1.0 K/uL   Eosinophils Relative 1 %   Eosinophils Absolute 0.1 0.0 - 0.7 K/uL   Basophils Relative 0 %   Basophils Absolute 0.0 0.0 - 0.1 K/uL  CBC WITH DIFFERENTIAL     Status: Abnormal   Collection Time: 11/14/15 12:34 AM  Result Value Ref Range   WBC 7.0 4.0 - 10.5 K/uL   RBC 3.79 (L) 3.87 - 5.11 MIL/uL   Hemoglobin 10.8 (L) 12.0 - 15.0 g/dL   HCT 32.9 (L) 36.0 - 46.0 %   MCV 86.8 78.0 - 100.0 fL   MCH 28.5 26.0 - 34.0 pg   MCHC 32.8 30.0 - 36.0 g/dL   RDW 13.8 11.5 - 15.5 %   Platelets  170 150 - 400 K/uL   Neutrophils Relative % 71 %   Neutro Abs 5.0 1.7 - 7.7 K/uL   Lymphocytes Relative 14 %   Lymphs Abs 1.0 0.7 - 4.0 K/uL   Monocytes Relative 14 %   Monocytes Absolute 1.0 0.1 - 1.0 K/uL   Eosinophils Relative 1 %   Eosinophils Absolute 0.1 0.0 - 0.7 K/uL   Basophils Relative 0 %   Basophils Absolute 0.0 0.0 - 0.1 K/uL  CBC with Differential/Platelet     Status: Abnormal   Collection Time: 11/15/15  8:34 AM  Result Value Ref Range   WBC 10.0 4.0 - 10.5 K/uL   RBC 4.02 3.87 - 5.11 MIL/uL   Hemoglobin 11.3 (L) 12.0 - 15.0 g/dL   HCT 35.4 (L) 36.0 - 46.0 %   MCV 88.1 78.0 - 100.0 fL   MCH 28.1 26.0 - 34.0 pg   MCHC 31.9 30.0 - 36.0 g/dL   RDW 13.8 11.5 - 15.5 %   Platelets 196 150 - 400 K/uL   Neutrophils Relative % 84 %   Neutro Abs 8.4 (H) 1.7 - 7.7 K/uL   Lymphocytes Relative 4 %   Lymphs Abs 0.4 (L) 0.7 - 4.0 K/uL   Monocytes Relative 12 %   Monocytes Absolute 1.2 (H) 0.1 - 1.0 K/uL   Eosinophils Relative 0 %   Eosinophils Absolute 0.0 0.0 - 0.7 K/uL   Basophils Relative 0 %   Basophils Absolute 0.0 0.0 - 0.1 K/uL    ABGS No  results for input(s): PHART, PO2ART, TCO2, HCO3 in the last 72 hours.  Invalid input(s): PCO2 CULTURES Recent Results (from the past 240 hour(s))  MRSA PCR Screening     Status: None   Collection Time: 11/12/15  6:14 PM  Result Value Ref Range Status   MRSA by PCR NEGATIVE NEGATIVE Final    Comment:        The GeneXpert MRSA Assay (FDA approved for NASAL specimens only), is one component of a comprehensive MRSA colonization surveillance program. It is not intended to diagnose MRSA infection nor to guide or monitor treatment for MRSA infections.    Studies/Results: No results found.  Medications:  Prior to Admission:  Prescriptions Prior to Admission  Medication Sig Dispense Refill Last Dose  . acetaminophen (TYLENOL) 325 MG tablet Take 325 mg by mouth daily as needed for moderate pain.   11/12/2015 at Unknown time  . apixaban (ELIQUIS) 2.5 MG TABS tablet Take 1 tablet (2.5 mg total) by mouth 2 (two) times daily. 60 tablet 0 11/12/2015 at Unknown time  . atorvastatin (LIPITOR) 80 MG tablet Take 1 tablet (80 mg total) by mouth every morning. 30 tablet 12 11/12/2015 at Unknown time  . calcium carbonate (TUMS EX) 750 MG chewable tablet Chew 2 tablets by mouth daily.    11/12/2015 at Unknown time  . furosemide (LASIX) 20 MG tablet Take 1 tablet (20 mg total) by mouth daily. 90 tablet 3 11/12/2015 at Unknown time  . losartan (COZAAR) 50 MG tablet Take 1 tablet (50 mg total) by mouth daily. 90 tablet 3 11/12/2015 at Unknown time  . Methylcellulose, Laxative, (CITRUCEL) 500 MG TABS Take 1 tablet by mouth daily.    11/12/2015 at Unknown time  . metoprolol (LOPRESSOR) 50 MG tablet Take 50 mg by mouth 2 (two) times daily.    11/12/2015 at Unknown time  . Multiple Vitamin (MULTIVITAMIN WITH MINERALS) TABS Take 1 tablet by mouth every morning. *Contains NO Iron (Ferrous Sulfate  and/or Iodine)*   11/12/2015 at Unknown time  . omeprazole (PRILOSEC) 20 MG capsule Take 20 mg by mouth every morning.     11/12/2015 at Unknown time  . trimethoprim (TRIMPEX) 100 MG tablet Take 100 mg by mouth at bedtime.   11/11/2015 at Unknown time  . vitamin C (ASCORBIC ACID) 500 MG tablet Take 500 mg by mouth every morning.    11/12/2015 at Unknown time  . [DISCONTINUED] oxybutynin (DITROPAN) 5 MG tablet Take 0.5-1 tablets by mouth 2 (two) times daily. May increase 1 tablet 3 times daily if needed.   11/12/2015 at Unknown time   Scheduled: . apixaban  2.5 mg Oral BID  . atorvastatin  80 mg Oral q1800  . losartan  50 mg Oral Daily  . metoprolol  50 mg Oral BID  . oxybutynin  2.5 mg Oral TID  . pantoprazole  40 mg Oral BID  . sodium chloride flush  3 mL Intravenous Q12H   Continuous:  [id number], LORazepam  Assesment: She is admitted with an upper GI bleed. She's doing better. She had atrial fib with rapid ventricular response yesterday but I think that was because of not having her beta blocker. She's back on that now. She is confused. At baseline she is very thin and very weak and I think she might need rehabilitation Principal Problem:   Upper GI bleed Active Problems:   Stroke ([place])   DM type 2 (diabetes mellitus, type 2) ([place])   Coronary artery disease   Essential hypertension    Plan: Ativan as needed. Continue other treatments. PT consult.    LOS: 3 days   Jakiyah Stepney L 11/15/2015, 10:21 AM

## 2015-11-15 NOTE — Progress Notes (Signed)
Pt asleep and resting comfortably in room.

## 2015-11-15 NOTE — Progress Notes (Signed)
Several attempts made to administer medication to patient. Pt has refused medication each time.

## 2015-11-15 NOTE — Progress Notes (Signed)
Pt has HR >130's. Pt's B/P 161/89. Dr Legrand Rams notified and received verbal order for a one time dose of Lopressor 5 mg IV.

## 2015-11-15 NOTE — Progress Notes (Signed)
Pt has expiratory wheezes bilaterally. Respiratory notified and will assess patient.

## 2015-11-15 NOTE — Progress Notes (Signed)
PT Cancellation Note  Patient Details Name: Caroline Aguirre MRN: [id number] DOB: [birth year]   Cancelled Treatment:    Reason Eval/Treat Not Completed: Medical issues which prohibited therapy.   Pt confused.  Rayetta Humphrey, PT CLT [phone] 11/15/2015, 1:27 PM

## 2015-11-16 LAB — CBC WITH DIFFERENTIAL/PLATELET
BASOS ABS: 0 10*3/uL (ref 0.0–0.1)
BASOS PCT: 0 %
EOS ABS: 0 10*3/uL (ref 0.0–0.7)
Eosinophils Relative: 0 %
HEMATOCRIT: 34.3 % — AB (ref 36.0–46.0)
HEMOGLOBIN: 11.1 g/dL — AB (ref 12.0–15.0)
Lymphocytes Relative: 11 %
Lymphs Abs: 1.1 10*3/uL (ref 0.7–4.0)
MCH: 28.5 pg (ref 26.0–34.0)
MCHC: 32.4 g/dL (ref 30.0–36.0)
MCV: 87.9 fL (ref 78.0–100.0)
MONOS PCT: 12 %
Monocytes Absolute: 1.2 10*3/uL — ABNORMAL HIGH (ref 0.1–1.0)
NEUTROS ABS: 7.2 10*3/uL (ref 1.7–7.7)
NEUTROS PCT: 77 %
Platelets: 179 10*3/uL (ref 150–400)
RBC: 3.9 MIL/uL (ref 3.87–5.11)
RDW: 13.9 % (ref 11.5–15.5)
WBC: 9.5 10*3/uL (ref 4.0–10.5)

## 2015-11-16 NOTE — Progress Notes (Signed)
Subjective: She was extremely agitated yesterday. She has no new complaints now. Her son is at bedside and I told him this is probably mostly due to being in the hospital. She is sleeping now.  Objective: Vital signs in last 24 hours: Temp:  [98.1 F (36.7 C)-98.3 F (36.8 C)] 98.1 F (36.7 C) (08/27 0617) Pulse Rate:  [69-133] 99 (08/27 0617) Resp:  [16-22] 16 (08/27 0617) BP: (115-161)/(57-89) 133/77 (08/27 0617) SpO2:  [97 %-99 %] 98 % (08/27 0749) Weight change:  Last BM Date: 11/14/15  Intake/Output from previous day: 08/26 0701 - 08/27 0700 In: 33 [P.O.:30; I.V.:3] Out: -   PHYSICAL EXAM General appearance: Sleeping Resp: clear to auscultation bilaterally Cardio: irregularly irregular rhythm GI: soft, non-tender; bowel sounds normal; no masses,  no organomegaly Extremities: extremities normal, atraumatic, no cyanosis or edema  Lab Results:  Results for orders placed or performed during the hospital encounter of 11/12/15 (from the past 48 hour(s))  CBC with Differential/Platelet     Status: Abnormal   Collection Time: 11/15/15  8:34 AM  Result Value Ref Range   WBC 10.0 4.0 - 10.5 K/uL   RBC 4.02 3.87 - 5.11 MIL/uL   Hemoglobin 11.3 (Aguirre) 12.0 - 15.0 g/dL   HCT 35.4 (Aguirre) 36.0 - 46.0 %   MCV 88.1 78.0 - 100.0 fL   MCH 28.1 26.0 - 34.0 pg   MCHC 31.9 30.0 - 36.0 g/dL   RDW 13.8 11.5 - 15.5 %   Platelets 196 150 - 400 K/uL   Neutrophils Relative % 84 %   Neutro Abs 8.4 (H) 1.7 - 7.7 K/uL   Lymphocytes Relative 4 %   Lymphs Abs 0.4 (Aguirre) 0.7 - 4.0 K/uL   Monocytes Relative 12 %   Monocytes Absolute 1.2 (H) 0.1 - 1.0 K/uL   Eosinophils Relative 0 %   Eosinophils Absolute 0.0 0.0 - 0.7 K/uL   Basophils Relative 0 %   Basophils Absolute 0.0 0.0 - 0.1 K/uL  CBC with Differential/Platelet     Status: Abnormal   Collection Time: 11/16/15  6:03 AM  Result Value Ref Range   WBC 9.5 4.0 - 10.5 K/uL   RBC 3.90 3.87 - 5.11 MIL/uL   Hemoglobin 11.1 (Aguirre) 12.0 - 15.0 g/dL   HCT 34.3 (Aguirre) 36.0 - 46.0 %   MCV 87.9 78.0 - 100.0 fL   MCH 28.5 26.0 - 34.0 pg   MCHC 32.4 30.0 - 36.0 g/dL   RDW 13.9 11.5 - 15.5 %   Platelets 179 150 - 400 K/uL   Neutrophils Relative % 77 %   Neutro Abs 7.2 1.7 - 7.7 K/uL   Lymphocytes Relative 11 %   Lymphs Abs 1.1 0.7 - 4.0 K/uL   Monocytes Relative 12 %   Monocytes Absolute 1.2 (H) 0.1 - 1.0 K/uL   Eosinophils Relative 0 %   Eosinophils Absolute 0.0 0.0 - 0.7 K/uL   Basophils Relative 0 %   Basophils Absolute 0.0 0.0 - 0.1 K/uL    ABGS No results for input(s): PHART, PO2ART, TCO2, HCO3 in the last 72 hours.  Invalid input(s): PCO2 CULTURES Recent Results (from the past 240 hour(s))  MRSA PCR Screening     Status: None   Collection Time: 11/12/15  6:14 PM  Result Value Ref Range Status   MRSA by PCR NEGATIVE NEGATIVE Final    Comment:        The GeneXpert MRSA Assay (FDA approved for NASAL specimens only), is one component of  a comprehensive MRSA colonization surveillance program. It is not intended to diagnose MRSA infection nor to guide or monitor treatment for MRSA infections.    Studies/Results: No results found.  Medications:  Prior to Admission:  Prescriptions Prior to Admission  Medication Sig Dispense Refill Last Dose  . acetaminophen (TYLENOL) 325 MG tablet Take 325 mg by mouth daily as needed for moderate pain.   11/12/2015 at Unknown time  . apixaban (ELIQUIS) 2.5 MG TABS tablet Take 1 tablet (2.5 mg total) by mouth 2 (two) times daily. 60 tablet 0 11/12/2015 at Unknown time  . atorvastatin (LIPITOR) 80 MG tablet Take 1 tablet (80 mg total) by mouth every morning. 30 tablet 12 11/12/2015 at Unknown time  . calcium carbonate (TUMS EX) 750 MG chewable tablet Chew 2 tablets by mouth daily.    11/12/2015 at Unknown time  . furosemide (LASIX) 20 MG tablet Take 1 tablet (20 mg total) by mouth daily. 90 tablet 3 11/12/2015 at Unknown time  . losartan (COZAAR) 50 MG tablet Take 1 tablet (50 mg total) by mouth  daily. 90 tablet 3 11/12/2015 at Unknown time  . Methylcellulose, Laxative, (CITRUCEL) 500 MG TABS Take 1 tablet by mouth daily.    11/12/2015 at Unknown time  . metoprolol (LOPRESSOR) 50 MG tablet Take 50 mg by mouth 2 (two) times daily.    11/12/2015 at Unknown time  . Multiple Vitamin (MULTIVITAMIN WITH MINERALS) TABS Take 1 tablet by mouth every morning. *Contains NO Iron (Ferrous Sulfate and/or Iodine)*   11/12/2015 at Unknown time  . omeprazole (PRILOSEC) 20 MG capsule Take 20 mg by mouth every morning.    11/12/2015 at Unknown time  . trimethoprim (TRIMPEX) 100 MG tablet Take 100 mg by mouth at bedtime.   11/11/2015 at Unknown time  . vitamin C (ASCORBIC ACID) 500 MG tablet Take 500 mg by mouth every morning.    11/12/2015 at Unknown time  . [DISCONTINUED] oxybutynin (DITROPAN) 5 MG tablet Take 0.5-1 tablets by mouth 2 (two) times daily. May increase 1 tablet 3 times daily if needed.   11/12/2015 at Unknown time   Scheduled: . apixaban  2.5 mg Oral BID  . atorvastatin  80 mg Oral q1800  . levalbuterol  0.63 mg Nebulization Q6H WA  . losartan  50 mg Oral Daily  . metoprolol  50 mg Oral BID  . oxybutynin  2.5 mg Oral TID  . pantoprazole  40 mg Oral BID  . sodium chloride flush  3 mL Intravenous Q12H   Continuous:  [id number], LORazepam  Assesment: She was admitted with an upper GI bleed. She has acute metabolic encephalopathy. She has coronary disease and atrial fibrillation baseline. She has had a previous stroke. Principal Problem:   Upper GI bleed Active Problems:   Stroke ([place])   DM type 2 (diabetes mellitus, type 2) ([place])   Coronary artery disease   Essential hypertension    Plan: Continue treatments. She has no evidence of any bleeding now. Continue with Ativan as needed. PT consultation when available and she is better    LOS: 4 days   Caroline Aguirre 11/16/2015, 10:20 AM

## 2015-11-17 ENCOUNTER — Encounter (HOSPITAL_COMMUNITY): Payer: Self-pay | Admitting: Internal Medicine

## 2015-11-17 DIAGNOSIS — I6930 Unspecified sequelae of cerebral infarction: Secondary | ICD-10-CM

## 2015-11-17 DIAGNOSIS — I4891 Unspecified atrial fibrillation: Secondary | ICD-10-CM | POA: Diagnosis present

## 2015-11-17 DIAGNOSIS — E43 Unspecified severe protein-calorie malnutrition: Secondary | ICD-10-CM | POA: Diagnosis present

## 2015-11-17 LAB — BASIC METABOLIC PANEL
Anion gap: 10 (ref 5–15)
BUN: 19 mg/dL (ref 6–20)
CHLORIDE: 104 mmol/L (ref 101–111)
CO2: 25 mmol/L (ref 22–32)
Calcium: 8.8 mg/dL — ABNORMAL LOW (ref 8.9–10.3)
Creatinine, Ser: 0.83 mg/dL (ref 0.44–1.00)
Glucose, Bld: 121 mg/dL — ABNORMAL HIGH (ref 65–99)
POTASSIUM: 3.8 mmol/L (ref 3.5–5.1)
SODIUM: 139 mmol/L (ref 135–145)

## 2015-11-17 LAB — CBC WITH DIFFERENTIAL/PLATELET
BASOS ABS: 0 10*3/uL (ref 0.0–0.1)
BASOS PCT: 0 %
EOS ABS: 0 10*3/uL (ref 0.0–0.7)
EOS PCT: 0 %
HCT: 37.9 % (ref 36.0–46.0)
HEMOGLOBIN: 12 g/dL (ref 12.0–15.0)
Lymphocytes Relative: 9 %
Lymphs Abs: 1.4 10*3/uL (ref 0.7–4.0)
MCH: 28 pg (ref 26.0–34.0)
MCHC: 31.7 g/dL (ref 30.0–36.0)
MCV: 88.6 fL (ref 78.0–100.0)
Monocytes Absolute: 1.6 10*3/uL — ABNORMAL HIGH (ref 0.1–1.0)
Monocytes Relative: 11 %
NEUTROS PCT: 80 %
Neutro Abs: 11.5 10*3/uL — ABNORMAL HIGH (ref 1.7–7.7)
PLATELETS: 312 10*3/uL (ref 150–400)
RBC: 4.28 MIL/uL (ref 3.87–5.11)
RDW: 13.7 % (ref 11.5–15.5)
WBC: 14.6 10*3/uL — AB (ref 4.0–10.5)

## 2015-11-17 NOTE — Evaluation (Signed)
Physical Therapy Evaluation Patient Details Name: Caroline Aguirre MRN: [id number] DOB: 1925-01-01 Today's Date: 11/17/2015   History of Present Illness  82 y.o. female with medical history significant of HTN, prior CVA, DCIS, DM, Migraine, prior SBO, presented to the ER as her son saw her having several bouts of coffee ground emesis.  She has mild abdominal pain.  She has been on ASA and Eliquis for recent diagnosis of afib, seeing Dr Flonnie Overman.  Dx: upper GI bleed with acute metabolic encephalopathy.  PMH: arthritis, CA - R breast - DCIS and mastectomy, CAD, diverticulitis, herniated disc s/p repair, HTN, SBO, shingles, CVA - cerebral hemorrhage - brain surgery, R knee arthroscopy x 3.  Clinical Impression  Pt received in bed, and seems obtunded.  She is not able to follow my commands at this time.  Son present and answers, PLOF questions.  Attempted to have pt sit on the EOB to improve alertness, however she remained obtunded, and required Max/Total A for all functional mobility.  This is likely due to medications.  Will follow to re-assess when pt becomes more alert.  At this time she would benefit from SNF.    Follow Up Recommendations SNF    Equipment Recommendations  None recommended by PT    Recommendations for Other Services       Precautions / Restrictions Precautions Precautions: Fall Precaution Comments: due to AMS, and general debility Restrictions Weight Bearing Restrictions: No      Mobility  Bed Mobility Overal bed mobility: Needs Assistance Bed Mobility: Supine to Sit;Sit to Supine     Supine to sit: Total assist;HOB elevated (Pt does not demonstrate initiation of transfer at this time) Sit to supine: Total assist   General bed mobility comments: Pt sat on the EOB for ~2 min, and required total A for static sitting balance due to lethargy.    Transfers                    Ambulation/Gait                Stairs            Wheelchair  Mobility    Modified Rankin (Stroke Patients Only)       Balance Overall balance assessment: Needs assistance Sitting-balance support: Bilateral upper extremity supported Sitting balance-Leahy Scale: Zero                                       Pertinent Vitals/Pain      Home Living   Living Arrangements: Children (son) Available Help at Discharge: Family;Available 24 hours/day Type of Home: House Home Access: Stairs to enter Entrance Stairs-Rails: Right Entrance Stairs-Number of Steps: 2 -3 on the front with a HR.  1 step through the garage.   Home Layout: Two level;Able to live on main level with bedroom/bathroom Home Equipment: Bedside commode;Shower seat;Cane - single point      Prior Function     Gait / Transfers Assistance Needed: Pt uses the cane occasionally - like when in the community, and occasionally around the house.   ADL's / Homemaking Assistance Needed: son reports that she is independent with dressing and bathing, not driving, and she gets out to the grocery store.          Hand Dominance   Dominant Hand: Right    Extremity/Trunk Assessment   Upper Extremity Assessment: Generalized weakness  Lower Extremity Assessment: Generalized weakness         Communication      Cognition                            General Comments      Exercises        Assessment/Plan    PT Assessment Patient needs continued PT services  PT Diagnosis Generalized weakness;Abnormality of gait;Difficulty walking   PT Problem List Decreased strength;Decreased activity tolerance;Decreased balance;Decreased mobility;Decreased cognition;Decreased knowledge of use of DME;Decreased coordination;Decreased safety awareness  PT Treatment Interventions DME instruction;Gait training;Functional mobility training;Therapeutic activities;Therapeutic exercise;Balance training;Neuromuscular re-education;Patient/family education   PT Goals  (Current goals can be found in the Care Plan section) Acute Rehab PT Goals PT Goal Formulation: Patient unable to participate in goal setting Time For Goal Achievement: 11/24/15 Potential to Achieve Goals: Fair    Frequency Min 2X/week   Barriers to discharge        Co-evaluation               End of Session   Activity Tolerance: Patient limited by lethargy Patient left: in bed;with call bell/phone within reach;with family/visitor present;with bed alarm set      Functional Assessment Tool Used: [employer] "6-clicks"  Mobility: Walking and Moving Around Current Status [phone]): At least 80 percent but less than 100 percent impaired, limited or restricted Mobility: Walking and Moving Around Goal Status [phone]): At least 60 percent but less than 80 percent impaired, limited or restricted    Time: [id number] PT Time Calculation (min) (ACUTE ONLY): 17 min   Charges:   PT Evaluation $PT Eval Moderate Complexity: 1 Procedure     PT G Codes:   PT G-Codes **NOT FOR INPATIENT CLASS** Functional Assessment Tool Used: [employer] "6-clicks"  Mobility: Walking and Moving Around Current Status [phone]): At least 80 percent but less than 100 percent impaired, limited or restricted Mobility: Walking and Moving Around Goal Status [phone]): At least 60 percent but less than 80 percent impaired, limited or restricted    [employer] 11/17/2015, 2:54 PM

## 2015-11-17 NOTE — Progress Notes (Signed)
Subjective: She is sleepy but arousable. She is confused. She is less agitated  Objective: Vital signs in last 24 hours: Temp:  [97.5 F (36.4 C)-98.6 F (37 C)] 97.5 F (36.4 C) (08/28 0521) Pulse Rate:  [70-117] 70 (08/28 0521) Resp:  [15-20] 20 (08/28 0521) BP: (121-171)/(65-81) 171/81 (08/28 0521) SpO2:  [95 %-99 %] 99 % (08/28 0521) Weight change:  Last BM Date: 11/16/15  Intake/Output from previous day: 08/27 0701 - 08/28 0700 In: 1143 [P.O.:1140; I.V.:3] Out: 1402 [Urine:1400; Stool:2]  PHYSICAL EXAM General appearance: Sleepy but arousable. Still agitated but less Resp: clear to auscultation bilaterally Cardio: irregularly irregular rhythm GI: soft, non-tender; bowel sounds normal; no masses,  no organomegaly Extremities: extremities normal, atraumatic, no cyanosis or edema  Lab Results:  Results for orders placed or performed during the hospital encounter of 11/12/15 (from the past 48 hour(s))  CBC with Differential/Platelet     Status: Abnormal   Collection Time: 11/16/15  6:03 AM  Result Value Ref Range   WBC 9.5 4.0 - 10.5 K/uL   RBC 3.90 3.87 - 5.11 MIL/uL   Hemoglobin 11.1 (L) 12.0 - 15.0 g/dL   HCT 34.3 (L) 36.0 - 46.0 %   MCV 87.9 78.0 - 100.0 fL   MCH 28.5 26.0 - 34.0 pg   MCHC 32.4 30.0 - 36.0 g/dL   RDW 13.9 11.5 - 15.5 %   Platelets 179 150 - 400 K/uL   Neutrophils Relative % 77 %   Neutro Abs 7.2 1.7 - 7.7 K/uL   Lymphocytes Relative 11 %   Lymphs Abs 1.1 0.7 - 4.0 K/uL   Monocytes Relative 12 %   Monocytes Absolute 1.2 (H) 0.1 - 1.0 K/uL   Eosinophils Relative 0 %   Eosinophils Absolute 0.0 0.0 - 0.7 K/uL   Basophils Relative 0 %   Basophils Absolute 0.0 0.0 - 0.1 K/uL  CBC with Differential/Platelet     Status: Abnormal   Collection Time: 11/17/15  6:00 AM  Result Value Ref Range   WBC 14.6 (H) 4.0 - 10.5 K/uL   RBC 4.28 3.87 - 5.11 MIL/uL   Hemoglobin 12.0 12.0 - 15.0 g/dL   HCT 37.9 36.0 - 46.0 %   MCV 88.6 78.0 - 100.0 fL   MCH  28.0 26.0 - 34.0 pg   MCHC 31.7 30.0 - 36.0 g/dL   RDW 13.7 11.5 - 15.5 %   Platelets 312 150 - 400 K/uL   Neutrophils Relative % 80 %   Neutro Abs 11.5 (H) 1.7 - 7.7 K/uL   Lymphocytes Relative 9 %   Lymphs Abs 1.4 0.7 - 4.0 K/uL   Monocytes Relative 11 %   Monocytes Absolute 1.6 (H) 0.1 - 1.0 K/uL   Eosinophils Relative 0 %   Eosinophils Absolute 0.0 0.0 - 0.7 K/uL   Basophils Relative 0 %   Basophils Absolute 0.0 0.0 - 0.1 K/uL  Basic metabolic panel     Status: Abnormal   Collection Time: 11/17/15  6:00 AM  Result Value Ref Range   Sodium 139 135 - 145 mmol/L   Potassium 3.8 3.5 - 5.1 mmol/L   Chloride 104 101 - 111 mmol/L   CO2 25 22 - 32 mmol/L   Glucose, Bld 121 (H) 65 - 99 mg/dL   BUN 19 6 - 20 mg/dL   Creatinine, Ser 0.83 0.44 - 1.00 mg/dL   Calcium 8.8 (L) 8.9 - 10.3 mg/dL   GFR calc non Af Amer >60 >60 mL/min  GFR calc Af Amer >60 >60 mL/min    Comment: (NOTE) The eGFR has been calculated using the CKD EPI equation. This calculation has not been validated in all clinical situations. eGFR's persistently <60 mL/min signify possible Chronic Kidney Disease.    Anion gap 10 5 - 15    ABGS No results for input(s): PHART, PO2ART, TCO2, HCO3 in the last 72 hours.  Invalid input(s): PCO2 CULTURES Recent Results (from the past 240 hour(s))  MRSA PCR Screening     Status: None   Collection Time: 11/12/15  6:14 PM  Result Value Ref Range Status   MRSA by PCR NEGATIVE NEGATIVE Final    Comment:        The GeneXpert MRSA Assay (FDA approved for NASAL specimens only), is one component of a comprehensive MRSA colonization surveillance program. It is not intended to diagnose MRSA infection nor to guide or monitor treatment for MRSA infections.    Studies/Results: No results found.  Medications:  Prior to Admission:  Prescriptions Prior to Admission  Medication Sig Dispense Refill Last Dose  . acetaminophen (TYLENOL) 325 MG tablet Take 325 mg by mouth daily  as needed for moderate pain.   11/12/2015 at Unknown time  . apixaban (ELIQUIS) 2.5 MG TABS tablet Take 1 tablet (2.5 mg total) by mouth 2 (two) times daily. 60 tablet 0 11/12/2015 at Unknown time  . atorvastatin (LIPITOR) 80 MG tablet Take 1 tablet (80 mg total) by mouth every morning. 30 tablet 12 11/12/2015 at Unknown time  . calcium carbonate (TUMS EX) 750 MG chewable tablet Chew 2 tablets by mouth daily.    11/12/2015 at Unknown time  . furosemide (LASIX) 20 MG tablet Take 1 tablet (20 mg total) by mouth daily. 90 tablet 3 11/12/2015 at Unknown time  . losartan (COZAAR) 50 MG tablet Take 1 tablet (50 mg total) by mouth daily. 90 tablet 3 11/12/2015 at Unknown time  . Methylcellulose, Laxative, (CITRUCEL) 500 MG TABS Take 1 tablet by mouth daily.    11/12/2015 at Unknown time  . metoprolol (LOPRESSOR) 50 MG tablet Take 50 mg by mouth 2 (two) times daily.    11/12/2015 at Unknown time  . Multiple Vitamin (MULTIVITAMIN WITH MINERALS) TABS Take 1 tablet by mouth every morning. *Contains NO Iron (Ferrous Sulfate and/or Iodine)*   11/12/2015 at Unknown time  . omeprazole (PRILOSEC) 20 MG capsule Take 20 mg by mouth every morning.    11/12/2015 at Unknown time  . trimethoprim (TRIMPEX) 100 MG tablet Take 100 mg by mouth at bedtime.   11/11/2015 at Unknown time  . vitamin C (ASCORBIC ACID) 500 MG tablet Take 500 mg by mouth every morning.    11/12/2015 at Unknown time  . [DISCONTINUED] oxybutynin (DITROPAN) 5 MG tablet Take 0.5-1 tablets by mouth 2 (two) times daily. May increase 1 tablet 3 times daily if needed.   11/12/2015 at Unknown time   Scheduled: . apixaban  2.5 mg Oral BID  . atorvastatin  80 mg Oral q1800  . levalbuterol  0.63 mg Nebulization Q6H WA  . losartan  50 mg Oral Daily  . metoprolol  50 mg Oral BID  . oxybutynin  2.5 mg Oral TID  . pantoprazole  40 mg Oral BID  . sodium chloride flush  3 mL Intravenous Q12H   Continuous:  [id number], LORazepam  Assesment: She was admitted with  upper GI bleeding. She has had altered mental status I think acute metabolic encephalopathy. She has previous episodes of stroke. She  has chronic atrial fib which is stable. She has hypertension which is pre-well controlled Principal Problem:   Upper GI bleed Active Problems:   Stroke ([place])   DM type 2 (diabetes mellitus, type 2) ([facility])   Coronary artery disease   Essential hypertension    Plan: See if PT can evaluate her today. I am concerned that she's not going to be able to go directly home    LOS: 5 days   Cara Thaxton L 11/17/2015, 8:50 AM

## 2015-11-17 NOTE — NC FL2 (Signed)
[place] LEVEL OF CARE SCREENING TOOL     IDENTIFICATION  Patient Name: Caroline Aguirre Birthdate: [birth year] Sex: female Admission Date (Current Location): 11/12/2015  [facility] [place] and [place] Number:  [employer] and Address:  [facility] [address], [place]      Provider Number: [id number]  Attending Physician Name and Address:  Sinda Du, MD  Relative Name and Phone Number:       Current Level of Care: Hospital Recommended Level of Care: [place] Prior Approval Number:    Date Approved/Denied:   PASRR Number: [id number]  Discharge Plan: SNF    Current Diagnoses: Patient Active Problem List   Diagnosis Date Noted  . Atrial fibrillation ([place]) 11/17/2015  . Personal history of stroke with current residual effects 11/17/2015  . Protein-calorie malnutrition, severe ([place]) 11/17/2015  . Upper GI bleed 11/12/2015  . ARF (acute renal failure) ([place]) 03/16/2015  . Acute encephalopathy 03/16/2015  . DM type 2 (diabetes mellitus, type 2) ([place]) 03/16/2015  . Urinary retention 03/16/2015  . AKI (acute kidney injury) ([place]) 03/16/2015  . Coronary artery disease 03/16/2015  . Essential hypertension 03/16/2015  . Sinus bradycardia 03/16/2015  . Vulvar irritation 07/17/2013  . SBO (small bowel obstruction) ([place]) 09/03/2011  . Cyst of breast, right, solitary 09/07/2010  . Fatigue/loss of sleep 09/07/2010  . High blood pressure 09/07/2010  . Bronchitis 09/07/2010  . Skin moles (abnormal) 09/07/2010  . Gastroesophageal reflux disease with hiatal hernia 09/07/2010  . Hearing loss 09/07/2010  . Wears glasses and/or contacts 09/07/2010  . Glaucoma 09/07/2010  . Menopause 09/07/2010  . Herniated disc   . Shingles   . Hardening of the arteries of the brain   . Stroke ([place])   . Hx Breast cancer, DCIS, Right 08/19/2010    Orientation RESPIRATION BLADDER Height & Weight     Self, Place  O2 (4.5 L)  Incontinent Weight: 118 lb 6.2 oz (53.7 kg) Height:  5\' 3"  (160 cm)  BEHAVIORAL SYMPTOMS/MOOD NEUROLOGICAL BOWEL NUTRITION STATUS  Other (Comment) (n/a)  (n/a) Incontinent Diet (Heart healthy)  AMBULATORY STATUS COMMUNICATION OF NEEDS Skin   Total Care Verbally Normal                       Personal Care Assistance Level of Assistance  Bathing, Feeding, Dressing, Total care   Feeding assistance: Limited assistance       Functional Limitations Info  Sight, Hearing, Speech Sight Info: Adequate Hearing Info: Impaired Speech Info: Adequate    SPECIAL CARE FACTORS FREQUENCY  PT (By licensed PT)                    Contractures      Additional Factors Info  Psychotropic Code Status Info: Full code Allergies Info: Tape, Ciprofloxacin, Sulfa Antibiotics, Codeine, Morphine and Related Psychotropic Info: Ativan         Current Medications (11/17/2015):  This is the current hospital active medication list Current Facility-Administered Medications  Medication Dose Route Frequency Provider Last Rate Last Dose  . acetaminophen (TYLENOL) tablet 325 mg  325 mg Oral Daily PRN Orvan Falconer, MD      . apixaban Arne [place]) tablet 2.5 mg  2.5 mg Oral BID Sinda Du, MD   2.5 mg at 11/17/15 1140  . atorvastatin (LIPITOR) tablet 80 mg  80 mg Oral q1800 Orvan Falconer, MD   80 mg at 11/16/15 1839  . levalbuterol (XOPENEX) nebulizer  solution 0.63 mg  0.63 mg Nebulization Q6H WA Rosita Fire, MD   0.63 mg at 11/17/15 1425  . LORazepam (ATIVAN) injection 0.5 mg  0.5 mg Intravenous Q4H PRN Sinda Du, MD   0.5 mg at 11/17/15 1056  . losartan (COZAAR) tablet 50 mg  50 mg Oral Daily Sinda Du, MD   50 mg at 11/17/15 1141  . metoprolol (LOPRESSOR) tablet 50 mg  50 mg Oral BID Sinda Du, MD   50 mg at 11/17/15 1140  . oxybutynin (DITROPAN) tablet 2.5 mg  2.5 mg Oral TID Sinda Du, MD   2.5 mg at 11/17/15 1140  . pantoprazole (PROTONIX) EC tablet 40 mg  40 mg Oral BID Sinda Du, MD   40 mg at 11/17/15 1141  . sodium chloride flush (NS) 0.9 % injection 3 mL  3 mL Intravenous Q12H Orvan Falconer, MD   3 mL at 11/17/15 1141     Discharge Medications: Please see discharge summary for a list of discharge medications.  Relevant Imaging Results:  Relevant Lab Results:   Additional Information SSN: [id number]  [name], [place]

## 2015-11-17 NOTE — Care Management Important Message (Signed)
Important Message  Patient Details  Name: Caroline Aguirre MRN: [id number] Date of Birth: 02/08/1925   Medicare Important Message Given:  Yes    Sherald Barge, RN 11/17/2015, 3:20 PM

## 2015-11-17 NOTE — Progress Notes (Signed)
Pt became agitated, pulling at equipment and trying to climb over bed rails.  PRN ativan given at this time.  will continue to monitor pt.

## 2015-11-18 MED ORDER — LORAZEPAM 2 MG/ML IJ SOLN: 0.2500 mg | INTRAMUSCULAR | Status: DC | PRN

## 2015-11-18 MED ORDER — LORAZEPAM 2 MG/ML IJ SOLN
0.5000 mg | Freq: Once | INTRAMUSCULAR | Status: AC
  Administered 2015-11-18: 0.5 mg via INTRAVENOUS
  Filled 2015-11-18: qty 1

## 2015-11-18 MED ORDER — GABAPENTIN 100 MG PO CAPS
100.0000 mg | ORAL_CAPSULE | Freq: Two times a day (BID) | ORAL | Status: DC
Start: 2015-11-18 — End: 2015-11-19
  Administered 2015-11-18 – 2015-11-19 (×3): 100 mg via ORAL
  Filled 2015-11-18 (×4): qty 1

## 2015-11-18 NOTE — Clinical Social Work Note (Signed)
Clinical Social Work Assessment  Patient Details  Name: Caroline Aguirre MRN: [id number] Date of Birth: [birth year]  Date of referral:  11/18/15               Reason for consult:  Discharge Planning                Permission sought to share information with:    Permission granted to share information::     Name::        Agency::     Relationship::     Contact Information:     Housing/Transportation Living arrangements for the past 2 months:  Single Family Home Source of Information:  Other (Comment Required) ([place]) Patient Interpreter Needed:  None Criminal Activity/Legal Involvement Pertinent to Current Situation/Hospitalization:  No - Comment as needed Significant Relationships:  Adult Children, Other Family Members Lives with:  Adult Children Do you feel safe going back to the place where you live?  No (needs rehab) Need for family participation in patient care:  Yes (Comment)  Care giving concerns:  Pt is unable to return home in current condition.    Social Worker assessment / plan:  CSW attempted to meet with pt at bedside. Pt sleeping soundly and per notes, has been very lethargic. CSW spoke with Caroline Aguirre who reports she is pt's HCPOA. Pt lives with her son and is independent with ADLs at baseline. She uses a cane to ambulate. Admitted due to GI bleed and pt has a history of CVA. Caroline Aguirre reports she has visited pt several times in hospital. She is aware of PT recommendation for SNF and agrees that this will be necessary. CSW discussed placement process, including Medicare coverage/criteria. She requests [facility] if possible and plans to discuss further with pt's son. Caroline Aguirre believes that pt has a long term care policy and will look into this further. SNF list left in room.   Employment status:  Retired Forensic scientist:  Medicare PT Recommendations:  [place] / Referral to community resources:  [place]  Patient/Family's  Response to care:  Caroline Aguirre has already spoken with pt's son and they have agreed that [facility] and [facility] are preferences.   Patient/Family's Understanding of and Emotional Response to Diagnosis, Current Treatment, and Prognosis:  Caroline Aguirre is aware of admission diagnosis and treatment plan. She was anticipating pt would require SNF at d/c.   Emotional Assessment Appearance:  Appears stated age Attitude/Demeanor/Rapport:  Unable to Assess Affect (typically observed):  Unable to Assess Orientation:    Alcohol / Substance use:  Not Applicable Psych involvement (Current and /or in the community):  No (Comment)  Discharge Needs  Concerns to be addressed:  Discharge Planning Concerns Readmission within the last 30 days:  No Current discharge risk:  Physical Impairment Barriers to Discharge:  Continued Medical Work up   [name], [place] 11/18/2015, 8:43 AM [phone]

## 2015-11-18 NOTE — Progress Notes (Signed)
Physical Therapy Treatment Patient Details Name: Caroline Aguirre MRN: [id number] DOB: 1924-08-08 Today's Date: 11/18/2015    History of Present Illness 82 y.o. female with medical history significant of HTN, prior CVA, DCIS, DM, Migraine, prior SBO, presented to the ER as her son saw her having several bouts of coffee ground emesis.  She has mild abdominal pain.  She has been on ASA and Eliquis for recent diagnosis of afib, seeing Dr Flonnie Overman.  Dx: upper GI bleed with acute metabolic encephalopathy.  PMH: arthritis, CA - R breast - DCIS and mastectomy, CAD, diverticulitis, herniated disc s/p repair, HTN, SBO, shingles, CVA - cerebral hemorrhage - brain surgery, R knee arthroscopy x 3.    PT Comments    Pt demonstrated much improved metation today, and was able to participate in PT, and follow commands, although not oriented to place or time.  Pt ambulated 2 x 78ft with RW and Min A.  She required Mod A for sit<>stand due to posterior lean.  She would still benefit from SNF due to decreased strength, mobility and balance.    Follow Up Recommendations  SNF     Equipment Recommendations  None recommended by PT    Recommendations for Other Services       Precautions / Restrictions Precautions Precautions: Fall (mitts) Precaution Comments: due to AMS, and general debility Restrictions Weight Bearing Restrictions: No    Mobility  Bed Mobility Overal bed mobility: Needs Assistance Bed Mobility: Supine to Sit;Sit to Supine     Supine to sit: Min guard (increased time) Sit to supine: Min guard (increased time)      Transfers Overall transfer level: Needs assistance Equipment used: Rolling walker (2 wheeled) Transfers: Sit to/from [facility] Sit to Stand: Min assist;Mod assist (Pt demonstrates strong posterior lean upon standing) Stand pivot transfers: Min assist;Mod assist (Pt requires increased assistance to turn and get lined up with the EOB, and tc's to reach  back for the EOB. )          Ambulation/Gait Ambulation/Gait assistance: Min assist Ambulation Distance (Feet): 20 Feet (x 2) Assistive device: Rolling walker (2 wheeled) Gait Pattern/deviations: Step-through pattern;Narrow base of support     General Gait Details: On 4 L of O2 with SpO2 remaining 92%.  Significant trendelenburg on the right    Stairs            Wheelchair Mobility    Modified Rankin (Stroke Patients Only)       Balance Overall balance assessment: Needs assistance Sitting-balance support: Bilateral upper extremity supported       Standing balance support: Bilateral upper extremity supported Standing balance-Leahy Scale: Fair                      Cognition Arousal/Alertness: Awake/alert Behavior During Therapy: WFL for tasks assessed/performed Overall Cognitive Status:  (improved - pt asks what day it is, and is appropriately conversive during tx today, however she is fatigued. )                      Exercises      General Comments        Pertinent Vitals/Pain Pain Assessment: No/denies pain    Home Living                      Prior Function            PT Goals (current goals can now be found in  the care plan section) Acute Rehab PT Goals PT Goal Formulation: Patient unable to participate in goal setting Time For Goal Achievement: 11/24/15 Potential to Achieve Goals: Fair Progress towards PT goals: Progressing toward goals    Frequency  Min 3X/week    PT Plan Current plan remains appropriate    Co-evaluation             End of Session Equipment Utilized During Treatment: Gait belt;Oxygen Activity Tolerance: Patient limited by fatigue Patient left: in bed;with call bell/phone within reach;with bed alarm set;with nursing/sitter in room     Time: 1349-1403 PT Time Calculation (min) (ACUTE ONLY): 14 min  Charges:  $Gait Training: 8-22 mins                    G Codes:      Beth  Laverna Dossett, PT, DPT X: [phone]

## 2015-11-18 NOTE — Care Management Note (Addendum)
Case Management Note  Patient Details  Name: Caroline Aguirre MRN: [id number] Date of Birth: 12/03/1924  Subjective/Objective:                  Pt admitted with GIB. She is from home and ind at baseline. PT has recommended SNF. Pt and family agreeable and CSW has made arrangements for placement at DC.   Action/Plan: No CM needs anticipated.   Expected Discharge Date:    8/30/207              Expected Discharge Plan:  [place]  In-House Referral:  Clinical Social Work  Discharge planning Services  CM Consult  Post Acute Care Choice:  NA Choice offered to:  NA  DME Arranged:    DME Agency:     HH Arranged:    [place] Agency:     Status of Service:  Completed, signed off  If discussed at [employer] of Stay Meetings, dates discussed: 11/18/2015    Additional Comments:  Sherald Barge, RN 11/18/2015, 2:37 PM

## 2015-11-18 NOTE — Progress Notes (Signed)
Subjective: Physical therapy has seen her and recommended skilled care facility placement. This morning she is sedated. She still becomes agitated at times. She has been very confused.  Objective: Vital signs in last 24 hours: Temp:  [97.5 F (36.4 C)-98.9 F (37.2 C)] 98.4 F (36.9 C) (08/29 0649) Pulse Rate:  [41-95] 41 (08/29 0649) Resp:  [22] 22 (08/29 0649) BP: (110-126)/(58-83) 126/83 (08/29 0649) SpO2:  [97 %-99 %] 98 % (08/29 0802) Weight change:  Last BM Date: 11/16/15  Intake/Output from previous day: 08/28 0701 - 08/29 0700 In: -  Out: 120 [Urine:120]  PHYSICAL EXAM General appearance: Sleeping after receiving sedation Resp: clear to auscultation bilaterally Cardio: irregularly irregular rhythm GI: soft, non-tender; bowel sounds normal; no masses,  no organomegaly Extremities: extremities normal, atraumatic, no cyanosis or edema  Lab Results:  Results for orders placed or performed during the hospital encounter of 11/12/15 (from the past 48 hour(s))  CBC with Differential/Platelet     Status: Abnormal   Collection Time: 11/17/15  6:00 AM  Result Value Ref Range   WBC 14.6 (H) 4.0 - 10.5 K/uL   RBC 4.28 3.87 - 5.11 MIL/uL   Hemoglobin 12.0 12.0 - 15.0 g/dL   HCT 37.9 36.0 - 46.0 %   MCV 88.6 78.0 - 100.0 fL   MCH 28.0 26.0 - 34.0 pg   MCHC 31.7 30.0 - 36.0 g/dL   RDW 13.7 11.5 - 15.5 %   Platelets 312 150 - 400 K/uL   Neutrophils Relative % 80 %   Neutro Abs 11.5 (H) 1.7 - 7.7 K/uL   Lymphocytes Relative 9 %   Lymphs Abs 1.4 0.7 - 4.0 K/uL   Monocytes Relative 11 %   Monocytes Absolute 1.6 (H) 0.1 - 1.0 K/uL   Eosinophils Relative 0 %   Eosinophils Absolute 0.0 0.0 - 0.7 K/uL   Basophils Relative 0 %   Basophils Absolute 0.0 0.0 - 0.1 K/uL  Basic metabolic panel     Status: Abnormal   Collection Time: 11/17/15  6:00 AM  Result Value Ref Range   Sodium 139 135 - 145 mmol/L   Potassium 3.8 3.5 - 5.1 mmol/L   Chloride 104 101 - 111 mmol/L   CO2 25 22  - 32 mmol/L   Glucose, Bld 121 (H) 65 - 99 mg/dL   BUN 19 6 - 20 mg/dL   Creatinine, Ser 0.83 0.44 - 1.00 mg/dL   Calcium 8.8 (L) 8.9 - 10.3 mg/dL   GFR calc non Af Amer >60 >60 mL/min   GFR calc Af Amer >60 >60 mL/min    Comment: (NOTE) The eGFR has been calculated using the CKD EPI equation. This calculation has not been validated in all clinical situations. eGFR's persistently <60 mL/min signify possible Chronic Kidney Disease.    Anion gap 10 5 - 15    ABGS No results for input(s): PHART, PO2ART, TCO2, HCO3 in the last 72 hours.  Invalid input(s): PCO2 CULTURES Recent Results (from the past 240 hour(s))  MRSA PCR Screening     Status: None   Collection Time: 11/12/15  6:14 PM  Result Value Ref Range Status   MRSA by PCR NEGATIVE NEGATIVE Final    Comment:        The GeneXpert MRSA Assay (FDA approved for NASAL specimens only), is one component of a comprehensive MRSA colonization surveillance program. It is not intended to diagnose MRSA infection nor to guide or monitor treatment for MRSA infections.    Studies/Results:  No results found.  Medications:  Prior to Admission:  Prescriptions Prior to Admission  Medication Sig Dispense Refill Last Dose  . acetaminophen (TYLENOL) 325 MG tablet Take 325 mg by mouth daily as needed for moderate pain.   11/12/2015 at Unknown time  . apixaban (ELIQUIS) 2.5 MG TABS tablet Take 1 tablet (2.5 mg total) by mouth 2 (two) times daily. 60 tablet 0 11/12/2015 at Unknown time  . atorvastatin (LIPITOR) 80 MG tablet Take 1 tablet (80 mg total) by mouth every morning. 30 tablet 12 11/12/2015 at Unknown time  . calcium carbonate (TUMS EX) 750 MG chewable tablet Chew 2 tablets by mouth daily.    11/12/2015 at Unknown time  . furosemide (LASIX) 20 MG tablet Take 1 tablet (20 mg total) by mouth daily. 90 tablet 3 11/12/2015 at Unknown time  . losartan (COZAAR) 50 MG tablet Take 1 tablet (50 mg total) by mouth daily. 90 tablet 3 11/12/2015 at  Unknown time  . Methylcellulose, Laxative, (CITRUCEL) 500 MG TABS Take 1 tablet by mouth daily.    11/12/2015 at Unknown time  . metoprolol (LOPRESSOR) 50 MG tablet Take 50 mg by mouth 2 (two) times daily.    11/12/2015 at Unknown time  . Multiple Vitamin (MULTIVITAMIN WITH MINERALS) TABS Take 1 tablet by mouth every morning. *Contains NO Iron (Ferrous Sulfate and/or Iodine)*   11/12/2015 at Unknown time  . omeprazole (PRILOSEC) 20 MG capsule Take 20 mg by mouth every morning.    11/12/2015 at Unknown time  . trimethoprim (TRIMPEX) 100 MG tablet Take 100 mg by mouth at bedtime.   11/11/2015 at Unknown time  . vitamin C (ASCORBIC ACID) 500 MG tablet Take 500 mg by mouth every morning.    11/12/2015 at Unknown time  . [DISCONTINUED] oxybutynin (DITROPAN) 5 MG tablet Take 0.5-1 tablets by mouth 2 (two) times daily. May increase 1 tablet 3 times daily if needed.   11/12/2015 at Unknown time   Scheduled: . apixaban  2.5 mg Oral BID  . atorvastatin  80 mg Oral q1800  . levalbuterol  0.63 mg Nebulization Q6H WA  . losartan  50 mg Oral Daily  . metoprolol  50 mg Oral BID  . oxybutynin  2.5 mg Oral TID  . pantoprazole  40 mg Oral BID  . sodium chloride flush  3 mL Intravenous Q12H   Continuous:  EHU:[id number], LORazepam  Assesment: She was admitted with upper GI bleeding. She is acutely encephalopathic probably from her bleeding being in the intensive care unit and she is known to have significant atherosclerotic disease. She has been agitated and required Ativan. No further bleeding.  She has diabetes which is reasonably well controlled.  She has personal history of stroke.  She has severe protein calorie malnutrition and she's not eating well now  She is severely deconditioned and will require skilled care facility   Principal Problem:   Upper GI bleed Active Problems:   Stroke ([place])   Acute encephalopathy   DM type 2 (diabetes mellitus, type 2) ([place])   Coronary artery disease    Essential hypertension   Atrial fibrillation (HCC)   Personal history of stroke with current residual effects   Protein-calorie malnutrition, severe ([place])    Plan: I'm going to reduce the dose of her Ativan and see if we can keep her cough without making her so sedated that she stays asleep.    LOS: 6 days   Wael Maestas L 11/18/2015, 8:52 AM

## 2015-11-18 NOTE — Consult Note (Signed)
   [facility] CM Inpatient Consult   11/18/2015  Caroline Aguirre May 22, 1924 [id number]  Patient screened for potential [facility] services. Patient is eligible for [place]. Electronic medical record reveals patient's discharge plan is for [place], there were no identifiable [facility] care management needs at this time. [facility] Care Management services not appropriate at this time. If patient's post hospital needs change please place a [facility] Care Management consult.  For questions please contact:    Royetta Crochet. Laymond Purser, RN, BSN, [employer] [phone]) Business Cell  [phone]) Toll Free Office

## 2015-11-18 NOTE — Clinical Social Work Placement (Signed)
   CLINICAL SOCIAL WORK PLACEMENT  NOTE  Date:  11/18/2015  Patient Details  Name: Caroline Aguirre MRN: [id number] Date of Birth: [birth year]  Clinical Social Work is seeking post-discharge placement for this patient at the [place] level of care (*CSW will initial, date and re-position this form in  chart as items are completed):  Yes   Patient/family provided with [place] Work Department's list of facilities offering this level of care within the geographic area requested by the patient (or if unable, by the patient's family).  Yes   Patient/family informed of their freedom to choose among providers that offer the needed level of care, that participate in Medicare, Medicaid or managed care program needed by the patient, have an available bed and are willing to accept the patient.  Yes   Patient/family informed of [place]'s ownership interest in [facility] and [facility], as well as of the fact that they are under no obligation to receive care at these facilities.  PASRR submitted to EDS on 11/17/15     PASRR number received on 11/17/15     Existing PASRR number confirmed on       FL2 transmitted to all facilities in geographic area requested by pt/family on 11/18/15     FL2 transmitted to all facilities within larger geographic area on       Patient informed that his/her managed care company has contracts with or will negotiate with certain facilities, including the following:            Patient/family informed of bed offers received.  Patient chooses bed at       Physician recommends and patient chooses bed at      Patient to be transferred to   on  .  Patient to be transferred to facility by       Patient family notified on   of transfer.  Name of family member notified:        PHYSICIAN       Additional Comment:    _______________________________________________ [name], [place] 11/18/2015, 8:41  AM [phone]

## 2015-11-19 ENCOUNTER — Inpatient Hospital Stay
Admission: RE | Admit: 2015-11-19 | Discharge: 2015-11-23 | Disposition: A | Discharge status: Nursing Home (Includes ICF) | Payer: Medicare Other | Source: Ambulatory Visit | Attending: Pulmonary Disease | Admitting: Pulmonary Disease

## 2015-11-19 DIAGNOSIS — E1169 Type 2 diabetes mellitus with other specified complication: Secondary | ICD-10-CM | POA: Diagnosis not present

## 2015-11-19 DIAGNOSIS — R488 Other symbolic dysfunctions: Secondary | ICD-10-CM | POA: Diagnosis not present

## 2015-11-19 DIAGNOSIS — H409 Unspecified glaucoma: Secondary | ICD-10-CM | POA: Diagnosis not present

## 2015-11-19 DIAGNOSIS — A4901 Methicillin susceptible Staphylococcus aureus infection, unspecified site: Secondary | ICD-10-CM | POA: Diagnosis present

## 2015-11-19 DIAGNOSIS — Z66 Do not resuscitate: Secondary | ICD-10-CM | POA: Diagnosis not present

## 2015-11-19 DIAGNOSIS — Z8673 Personal history of transient ischemic attack (TIA), and cerebral infarction without residual deficits: Secondary | ICD-10-CM | POA: Diagnosis not present

## 2015-11-19 DIAGNOSIS — Z853 Personal history of malignant neoplasm of breast: Secondary | ICD-10-CM | POA: Diagnosis not present

## 2015-11-19 DIAGNOSIS — Z9011 Acquired absence of right breast and nipple: Secondary | ICD-10-CM | POA: Diagnosis not present

## 2015-11-19 DIAGNOSIS — N183 Chronic kidney disease, stage 3 (moderate): Secondary | ICD-10-CM | POA: Diagnosis present

## 2015-11-19 DIAGNOSIS — M79661 Pain in right lower leg: Secondary | ICD-10-CM | POA: Diagnosis not present

## 2015-11-19 DIAGNOSIS — I482 Chronic atrial fibrillation: Secondary | ICD-10-CM | POA: Diagnosis not present

## 2015-11-19 DIAGNOSIS — R293 Abnormal posture: Secondary | ICD-10-CM | POA: Diagnosis not present

## 2015-11-19 DIAGNOSIS — S199XXA Unspecified injury of neck, initial encounter: Secondary | ICD-10-CM | POA: Diagnosis not present

## 2015-11-19 DIAGNOSIS — R296 Repeated falls: Secondary | ICD-10-CM | POA: Diagnosis present

## 2015-11-19 DIAGNOSIS — M25561 Pain in right knee: Secondary | ICD-10-CM | POA: Diagnosis not present

## 2015-11-19 DIAGNOSIS — I129 Hypertensive chronic kidney disease with stage 1 through stage 4 chronic kidney disease, or unspecified chronic kidney disease: Secondary | ICD-10-CM | POA: Diagnosis present

## 2015-11-19 DIAGNOSIS — W19XXXA Unspecified fall, initial encounter: Secondary | ICD-10-CM | POA: Diagnosis not present

## 2015-11-19 DIAGNOSIS — M25571 Pain in right ankle and joints of right foot: Secondary | ICD-10-CM | POA: Diagnosis not present

## 2015-11-19 DIAGNOSIS — R339 Retention of urine, unspecified: Secondary | ICD-10-CM | POA: Diagnosis not present

## 2015-11-19 DIAGNOSIS — I739 Peripheral vascular disease, unspecified: Secondary | ICD-10-CM | POA: Diagnosis present

## 2015-11-19 DIAGNOSIS — M6281 Muscle weakness (generalized): Secondary | ICD-10-CM | POA: Diagnosis not present

## 2015-11-19 DIAGNOSIS — E119 Type 2 diabetes mellitus without complications: Secondary | ICD-10-CM | POA: Diagnosis not present

## 2015-11-19 DIAGNOSIS — G9340 Encephalopathy, unspecified: Secondary | ICD-10-CM | POA: Diagnosis not present

## 2015-11-19 DIAGNOSIS — E43 Unspecified severe protein-calorie malnutrition: Secondary | ICD-10-CM | POA: Diagnosis not present

## 2015-11-19 DIAGNOSIS — S72041A Displaced fracture of base of neck of right femur, initial encounter for closed fracture: Secondary | ICD-10-CM | POA: Diagnosis not present

## 2015-11-19 DIAGNOSIS — Z9181 History of falling: Secondary | ICD-10-CM | POA: Diagnosis not present

## 2015-11-19 DIAGNOSIS — Z5189 Encounter for other specified aftercare: Secondary | ICD-10-CM | POA: Diagnosis not present

## 2015-11-19 DIAGNOSIS — M79632 Pain in left forearm: Secondary | ICD-10-CM | POA: Diagnosis not present

## 2015-11-19 DIAGNOSIS — Z8249 Family history of ischemic heart disease and other diseases of the circulatory system: Secondary | ICD-10-CM | POA: Diagnosis not present

## 2015-11-19 DIAGNOSIS — M25551 Pain in right hip: Secondary | ICD-10-CM | POA: Diagnosis not present

## 2015-11-19 DIAGNOSIS — Y92129 Unspecified place in nursing home as the place of occurrence of the external cause: Secondary | ICD-10-CM | POA: Diagnosis not present

## 2015-11-19 DIAGNOSIS — N390 Urinary tract infection, site not specified: Secondary | ICD-10-CM | POA: Diagnosis not present

## 2015-11-19 DIAGNOSIS — I10 Essential (primary) hypertension: Secondary | ICD-10-CM | POA: Diagnosis not present

## 2015-11-19 DIAGNOSIS — M79622 Pain in left upper arm: Secondary | ICD-10-CM | POA: Diagnosis not present

## 2015-11-19 DIAGNOSIS — R000 Tachycardia, unspecified: Secondary | ICD-10-CM | POA: Diagnosis not present

## 2015-11-19 DIAGNOSIS — I4891 Unspecified atrial fibrillation: Secondary | ICD-10-CM | POA: Diagnosis not present

## 2015-11-19 DIAGNOSIS — E1122 Type 2 diabetes mellitus with diabetic chronic kidney disease: Secondary | ICD-10-CM | POA: Diagnosis not present

## 2015-11-19 DIAGNOSIS — F0390 Unspecified dementia without behavioral disturbance: Secondary | ICD-10-CM | POA: Diagnosis not present

## 2015-11-19 DIAGNOSIS — Z79899 Other long term (current) drug therapy: Secondary | ICD-10-CM | POA: Diagnosis not present

## 2015-11-19 DIAGNOSIS — H9190 Unspecified hearing loss, unspecified ear: Secondary | ICD-10-CM | POA: Diagnosis not present

## 2015-11-19 DIAGNOSIS — S0990XA Unspecified injury of head, initial encounter: Secondary | ICD-10-CM | POA: Diagnosis not present

## 2015-11-19 DIAGNOSIS — S299XXA Unspecified injury of thorax, initial encounter: Secondary | ICD-10-CM | POA: Diagnosis not present

## 2015-11-19 DIAGNOSIS — K922 Gastrointestinal hemorrhage, unspecified: Secondary | ICD-10-CM | POA: Diagnosis not present

## 2015-11-19 DIAGNOSIS — R278 Other lack of coordination: Secondary | ICD-10-CM | POA: Diagnosis not present

## 2015-11-19 DIAGNOSIS — S72001A Fracture of unspecified part of neck of right femur, initial encounter for closed fracture: Secondary | ICD-10-CM | POA: Diagnosis not present

## 2015-11-19 DIAGNOSIS — K219 Gastro-esophageal reflux disease without esophagitis: Secondary | ICD-10-CM | POA: Diagnosis not present

## 2015-11-19 DIAGNOSIS — I2510 Atherosclerotic heart disease of native coronary artery without angina pectoris: Secondary | ICD-10-CM | POA: Diagnosis not present

## 2015-11-19 MED ORDER — LEVALBUTEROL HCL 0.63 MG/3ML IN NEBU: 0.6300 mg | INHALATION_SOLUTION | Freq: Four times a day (QID) | RESPIRATORY_TRACT | 12 refills | Status: DC | PRN

## 2015-11-19 MED ORDER — GABAPENTIN 100 MG PO CAPS: 100.0000 mg | ORAL_CAPSULE | Freq: Two times a day (BID) | ORAL | Status: AC

## 2015-11-19 MED ORDER — LORAZEPAM 0.5 MG PO TABS: 0.5000 mg | ORAL_TABLET | Freq: Three times a day (TID) | ORAL | 0 refills | Status: DC | PRN

## 2015-11-19 MED ORDER — LEVALBUTEROL HCL 0.63 MG/3ML IN NEBU: 0.6300 mg | INHALATION_SOLUTION | Freq: Four times a day (QID) | RESPIRATORY_TRACT | Status: DC | PRN

## 2015-11-19 MED ORDER — LEVALBUTEROL HCL 0.63 MG/3ML IN NEBU
0.6300 mg | INHALATION_SOLUTION | Freq: Three times a day (TID) | RESPIRATORY_TRACT | Status: DC
  Administered 2015-11-19: 0.63 mg via RESPIRATORY_TRACT
  Filled 2015-11-19: qty 3

## 2015-11-19 MED ORDER — OXYBUTYNIN CHLORIDE 5 MG PO TABS: 2.5000 mg | ORAL_TABLET | Freq: Three times a day (TID) | ORAL | Status: DC

## 2015-11-19 MED ORDER — PANTOPRAZOLE SODIUM 40 MG PO TBEC: 40.0000 mg | DELAYED_RELEASE_TABLET | Freq: Two times a day (BID) | ORAL | Status: DC

## 2015-11-19 NOTE — Progress Notes (Signed)
Physical Therapy Treatment Patient Details Name: Caroline Aguirre MRN: [id number] DOB: Jun 22, 1924 Today's Date: 11/19/2015    History of Present Illness 82 y.o. female with medical history significant of HTN, prior CVA, DCIS, DM, Migraine, prior SBO, presented to the ER as her son saw her having several bouts of coffee ground emesis.  She has mild abdominal pain.  She has been on ASA and Eliquis for recent diagnosis of afib, seeing Dr Flonnie Overman.  Dx: upper GI bleed with acute metabolic encephalopathy.  PMH: arthritis, CA - R breast - DCIS and mastectomy, CAD, diverticulitis, herniated disc s/p repair, HTN, SBO, shingles, CVA - cerebral hemorrhage - brain surgery, R knee arthroscopy x 3.    PT Comments    Pt received in bed, and was agreeable to PT tx.  Pt, was able to increase gait distance, but required mod A due to LOB to the right with ataxic gait and scissoring with (+) trendelenburg sign for poor pelvic stability.  Continue to recommend SNF upon d/c due to pt's continued high risk of falling.   Follow Up Recommendations  SNF     Equipment Recommendations  None recommended by PT    Recommendations for Other Services       Precautions / Restrictions Precautions Precautions: Fall Precaution Comments: due to AMS, and general debility Restrictions Weight Bearing Restrictions: No    Mobility  Bed Mobility Overal bed mobility: Needs Assistance Bed Mobility: Supine to Sit     Supine to sit: Supervision (increased time)        Transfers Overall transfer level: Needs assistance Equipment used: Rolling walker (2 wheeled) Transfers: Sit to/from Stand Sit to Stand: Min assist (posterior lean upon standing)            Ambulation/Gait Ambulation/Gait assistance: Mod assist Ambulation Distance (Feet): 80 Feet Assistive device: Rolling walker (2 wheeled) Gait Pattern/deviations: Ataxic;Scissoring     General Gait Details: LOB R due to trendelenburg and scissor stepping.   VC's to keep the RW closer to her BOS.    Stairs            Wheelchair Mobility    Modified Rankin (Stroke Patients Only)       Balance Overall balance assessment: Needs assistance Sitting-balance support: Bilateral upper extremity supported Sitting balance-Leahy Scale: Fair     Standing balance support: Bilateral upper extremity supported Standing balance-Leahy Scale: Fair                      Cognition Arousal/Alertness: Awake/alert Behavior During Therapy: WFL for tasks assessed/performed                        Exercises      General Comments        Pertinent Vitals/Pain Pain Assessment: No/denies pain    Home Living                      Prior Function            PT Goals (current goals can now be found in the care plan section) Acute Rehab PT Goals PT Goal Formulation: Patient unable to participate in goal setting Time For Goal Achievement: 11/24/15 Potential to Achieve Goals: Fair Progress towards PT goals: Progressing toward goals    Frequency  Min 3X/week    PT Plan Current plan remains appropriate    Co-evaluation             End  of Session Equipment Utilized During Treatment: Gait belt;Oxygen Activity Tolerance: Patient tolerated treatment well Patient left: in chair;with family/visitor present;with call bell/phone within reach     Time: 1130-1150 PT Time Calculation (min) (ACUTE ONLY): 20 min  Charges:  $Gait Training: 8-22 mins                    G Codes:      Beth Angeles Paolucci, PT, DPT X: [phone]

## 2015-11-19 NOTE — Progress Notes (Signed)
She is much improved. She has not required Ativan for the last 24 hours or so. She is not agitated. She is still confused. Exam shows that she remains in atrial fib but her heart rate is better controlled. Her chest is clear. Her abdomen is soft. She is ready for transfer to skilled care facility

## 2015-11-19 NOTE — Discharge Summary (Signed)
Physician Discharge Summary  Patient ID: Caroline Aguirre MRN: [id number] DOB/AGE: 09-18-1924 82 y.o. Primary Care Physician:Chalice Philbert L, MD Admit date: 11/12/2015 Discharge date: 11/19/2015    Discharge Diagnoses:   Principal Problem:   Upper GI bleed Active Problems:   Stroke [facility])   Acute encephalopathy   DM type 2 (diabetes mellitus, type 2) ([place])   Coronary artery disease   Essential hypertension   Atrial fibrillation (HCC)   Personal history of stroke with current residual effects   Protein-calorie malnutrition, severe (HCC) Portal gastropathy    Medication List    STOP taking these medications   omeprazole 20 MG capsule Commonly known as:  PRILOSEC     TAKE these medications   acetaminophen 325 MG tablet Commonly known as:  TYLENOL Take 325 mg by mouth daily as needed for moderate pain.   apixaban 2.5 MG Tabs tablet Commonly known as:  ELIQUIS Take 1 tablet (2.5 mg total) by mouth 2 (two) times daily.   atorvastatin 80 MG tablet Commonly known as:  LIPITOR Take 1 tablet (80 mg total) by mouth every morning.   calcium carbonate 750 MG chewable tablet Commonly known as:  TUMS EX Chew 2 tablets by mouth daily.   CITRUCEL 500 MG Tabs Generic drug:  Methylcellulose (Laxative) Take 1 tablet by mouth daily.   furosemide 20 MG tablet Commonly known as:  LASIX Take 1 tablet (20 mg total) by mouth daily.   gabapentin 100 MG capsule Commonly known as:  NEURONTIN Take 1 capsule (100 mg total) by mouth 2 (two) times daily.   levalbuterol 0.63 MG/3ML nebulizer solution Commonly known as:  XOPENEX Take 3 mLs (0.63 mg total) by nebulization every 6 (six) hours as needed for wheezing or shortness of breath.   LORazepam 0.5 MG tablet Commonly known as:  ATIVAN Take 1 tablet (0.5 mg total) by mouth every 8 (eight) hours as needed for anxiety.   losartan 50 MG tablet Commonly known as:  COZAAR Take 1 tablet (50 mg total) by mouth daily.   metoprolol 50 MG  tablet Commonly known as:  LOPRESSOR Take 50 mg by mouth 2 (two) times daily.   multivitamin with minerals Tabs tablet Take 1 tablet by mouth every morning. *Contains NO Iron (Ferrous Sulfate and/or Iodine)*   oxybutynin 5 MG tablet Commonly known as:  DITROPAN Take 0.5 tablets (2.5 mg total) by mouth 3 (three) times daily.   pantoprazole 40 MG tablet Commonly known as:  PROTONIX Take 1 tablet (40 mg total) by mouth 2 (two) times daily.   trimethoprim 100 MG tablet Commonly known as:  TRIMPEX Take 100 mg by mouth at bedtime.   vitamin C 500 MG tablet Commonly known as:  ASCORBIC ACID Take 500 mg by mouth every morning.       Discharged Condition:Improved    Consults: GI  Significant Diagnostic Studies: No results found.  Lab Results: Basic Metabolic Panel:  Recent Labs  11/17/15 0600  NA 139  K 3.8  CL 104  CO2 25  GLUCOSE 121*  BUN 19  CREATININE 0.83  CALCIUM 8.8*   Liver Function Tests: No results for input(s): AST, ALT, ALKPHOS, BILITOT, PROT, ALBUMIN in the last 72 hours.   CBC:  Recent Labs  11/17/15 0600  WBC 14.6*  NEUTROABS 11.5*  HGB 12.0  HCT 37.9  MCV 88.6  PLT 312    Recent Results (from the past 240 hour(s))  MRSA PCR Screening     Status: None   Collection  Time: 11/12/15  6:14 PM  Result Value Ref Range Status   MRSA by PCR NEGATIVE NEGATIVE Final    Comment:        The GeneXpert MRSA Assay (FDA approved for NASAL specimens only), is one component of a comprehensive MRSA colonization surveillance program. It is not intended to diagnose MRSA infection nor to guide or monitor treatment for MRSA infections.      Hospital Course: This is an 82 year old who came to the hospital with GI bleeding. She had upper endoscopy. Her bleeding stopped. She improved but had problems with metabolic encephalopathy multi-factorial. She eventually was able to stop requiring a sitter for safety became much less agitated and was ready for  transfer to skilled care facility. She had no further bleeding during the hospitalization. Her atrial fibrillation was treated with her home medications. She will need double dose PPI for a period of 60 days or so. She is going to go to the skilled care facility for rehabilitation.  Discharge Exam: Blood pressure 137/67, pulse 93, temperature 97.6 F (36.4 C), temperature source Axillary, resp. rate 18, height 5\' 3"  (1.6 m), weight 53.7 kg (118 lb 6.2 oz), SpO2 99 %. She is awake and alert. She is confused. She is in atrial fib. Her abdomen is soft.  Disposition: To skilled care facility for rehabilitation. She will be on heart healthy diet. She has Ativan available if she becomes confused and agitated. She will have PT OT and speech as needed.  Discharge Instructions    Discharge to SNF when bed available    Complete by:  As directed        Signed: Axle Parfait L   11/19/2015, 9:06 AM

## 2015-11-19 NOTE — Clinical Social Work Placement (Addendum)
   CLINICAL SOCIAL WORK PLACEMENT  NOTE  Date:  11/19/2015  Patient Details  Name: Caroline Aguirre MRN: [id number] Date of Birth: 1925-02-01  Clinical Social Work is seeking post-discharge placement for this patient at the [place] level of care (*CSW will initial, date and re-position this form in  chart as items are completed):  Yes   Patient/family provided with [facility] Work Department's list of facilities offering this level of care within the geographic area requested by the patient (or if unable, by the patient's family).  Yes   Patient/family informed of their freedom to choose among providers that offer the needed level of care, that participate in Medicare, Medicaid or managed care program needed by the patient, have an available bed and are willing to accept the patient.  Yes   Patient/family informed of [facility]'s ownership interest in [facility] and [facility], as well as of the fact that they are under no obligation to receive care at these facilities.  PASRR submitted to EDS on 11/17/15     PASRR number received on 11/17/15     Existing PASRR number confirmed on       FL2 transmitted to all facilities in geographic area requested by pt/family on 11/18/15     FL2 transmitted to all facilities within larger geographic area on       Patient informed that his/her managed care company has contracts with or will negotiate with certain facilities, including the following:        Yes   Patient/family informed of bed offers received.  Patient chooses bed at [facility]     Physician recommends and patient chooses bed at      Patient to be transferred to [facility] on 11/19/15.  Patient to be transferred to facility by staff     Patient family notified on 11/19/15 of transfer.  Name of [facility] member notified:  [facility]     PHYSICIAN       Additional Comment:  [facility] aware pt has had sitter, but has  not required this since stopping Ativan. Agreeable to accept today. _______________________________________________ [name], [place] 11/19/2015, 8:42 AM [phone]

## 2015-11-19 NOTE — Progress Notes (Signed)
Pt discharged home today per Dr. Luan Pulling.  Pt's IV site D/C'd and WDL.  Pt's VSS.  Report given to Inez Catalina, nurse at [facility].  Verbalized report.  AVS and scripts placed in envelope and given to NT with instructions to give to nursing staff when arriving to [facility].  Verbalized understanding.  Pt transferred to [facility] via [facility] in stable condition accompanied by NT.

## 2015-11-23 ENCOUNTER — Encounter (HOSPITAL_COMMUNITY): Payer: Self-pay | Admitting: *Deleted

## 2015-11-23 ENCOUNTER — Inpatient Hospital Stay (HOSPITAL_COMMUNITY)
Admission: EM | Admit: 2015-11-23 | Discharge: 2015-11-28 | DRG: 469 | Disposition: A | Discharge status: Skilled Nursing Facility | Payer: Medicare Other | Attending: Internal Medicine | Admitting: Internal Medicine

## 2015-11-23 ENCOUNTER — Emergency Department (HOSPITAL_COMMUNITY): Payer: Medicare Other

## 2015-11-23 DIAGNOSIS — Z66 Do not resuscitate: Secondary | ICD-10-CM | POA: Diagnosis not present

## 2015-11-23 DIAGNOSIS — I129 Hypertensive chronic kidney disease with stage 1 through stage 4 chronic kidney disease, or unspecified chronic kidney disease: Secondary | ICD-10-CM | POA: Diagnosis present

## 2015-11-23 DIAGNOSIS — Y92129 Unspecified place in nursing home as the place of occurrence of the external cause: Secondary | ICD-10-CM | POA: Diagnosis not present

## 2015-11-23 DIAGNOSIS — S199XXA Unspecified injury of neck, initial encounter: Secondary | ICD-10-CM | POA: Diagnosis not present

## 2015-11-23 DIAGNOSIS — S0990XA Unspecified injury of head, initial encounter: Secondary | ICD-10-CM | POA: Diagnosis not present

## 2015-11-23 DIAGNOSIS — M25551 Pain in right hip: Secondary | ICD-10-CM | POA: Diagnosis not present

## 2015-11-23 DIAGNOSIS — K219 Gastro-esophageal reflux disease without esophagitis: Secondary | ICD-10-CM | POA: Diagnosis not present

## 2015-11-23 DIAGNOSIS — W19XXXA Unspecified fall, initial encounter: Secondary | ICD-10-CM | POA: Diagnosis present

## 2015-11-23 DIAGNOSIS — H409 Unspecified glaucoma: Secondary | ICD-10-CM | POA: Diagnosis not present

## 2015-11-23 DIAGNOSIS — R000 Tachycardia, unspecified: Secondary | ICD-10-CM | POA: Diagnosis present

## 2015-11-23 DIAGNOSIS — R278 Other lack of coordination: Secondary | ICD-10-CM | POA: Diagnosis not present

## 2015-11-23 DIAGNOSIS — Z471 Aftercare following joint replacement surgery: Secondary | ICD-10-CM | POA: Diagnosis not present

## 2015-11-23 DIAGNOSIS — Z9011 Acquired absence of right breast and nipple: Secondary | ICD-10-CM | POA: Diagnosis not present

## 2015-11-23 DIAGNOSIS — Z79899 Other long term (current) drug therapy: Secondary | ICD-10-CM | POA: Diagnosis not present

## 2015-11-23 DIAGNOSIS — M79632 Pain in left forearm: Secondary | ICD-10-CM | POA: Diagnosis not present

## 2015-11-23 DIAGNOSIS — I10 Essential (primary) hypertension: Secondary | ICD-10-CM | POA: Diagnosis not present

## 2015-11-23 DIAGNOSIS — Z09 Encounter for follow-up examination after completed treatment for conditions other than malignant neoplasm: Secondary | ICD-10-CM

## 2015-11-23 DIAGNOSIS — S72041D Displaced fracture of base of neck of right femur, subsequent encounter for closed fracture with routine healing: Secondary | ICD-10-CM | POA: Diagnosis not present

## 2015-11-23 DIAGNOSIS — M79622 Pain in left upper arm: Secondary | ICD-10-CM | POA: Diagnosis not present

## 2015-11-23 DIAGNOSIS — S72041A Displaced fracture of base of neck of right femur, initial encounter for closed fracture: Secondary | ICD-10-CM | POA: Diagnosis not present

## 2015-11-23 DIAGNOSIS — N183 Chronic kidney disease, stage 3 (moderate): Secondary | ICD-10-CM | POA: Diagnosis present

## 2015-11-23 DIAGNOSIS — S72001A Fracture of unspecified part of neck of right femur, initial encounter for closed fracture: Secondary | ICD-10-CM | POA: Diagnosis not present

## 2015-11-23 DIAGNOSIS — I2510 Atherosclerotic heart disease of native coronary artery without angina pectoris: Secondary | ICD-10-CM | POA: Diagnosis not present

## 2015-11-23 DIAGNOSIS — A4901 Methicillin susceptible Staphylococcus aureus infection, unspecified site: Secondary | ICD-10-CM | POA: Diagnosis present

## 2015-11-23 DIAGNOSIS — M25571 Pain in right ankle and joints of right foot: Secondary | ICD-10-CM | POA: Diagnosis not present

## 2015-11-23 DIAGNOSIS — F0390 Unspecified dementia without behavioral disturbance: Secondary | ICD-10-CM | POA: Diagnosis present

## 2015-11-23 DIAGNOSIS — G9340 Encephalopathy, unspecified: Secondary | ICD-10-CM | POA: Diagnosis present

## 2015-11-23 DIAGNOSIS — Z9181 History of falling: Secondary | ICD-10-CM | POA: Diagnosis not present

## 2015-11-23 DIAGNOSIS — Z853 Personal history of malignant neoplasm of breast: Secondary | ICD-10-CM | POA: Diagnosis not present

## 2015-11-23 DIAGNOSIS — I482 Chronic atrial fibrillation: Secondary | ICD-10-CM | POA: Diagnosis not present

## 2015-11-23 DIAGNOSIS — S299XXA Unspecified injury of thorax, initial encounter: Secondary | ICD-10-CM | POA: Diagnosis not present

## 2015-11-23 DIAGNOSIS — E1169 Type 2 diabetes mellitus with other specified complication: Secondary | ICD-10-CM | POA: Diagnosis not present

## 2015-11-23 DIAGNOSIS — Z419 Encounter for procedure for purposes other than remedying health state, unspecified: Secondary | ICD-10-CM

## 2015-11-23 DIAGNOSIS — I739 Peripheral vascular disease, unspecified: Secondary | ICD-10-CM | POA: Diagnosis present

## 2015-11-23 DIAGNOSIS — R296 Repeated falls: Secondary | ICD-10-CM | POA: Diagnosis present

## 2015-11-23 DIAGNOSIS — I4891 Unspecified atrial fibrillation: Secondary | ICD-10-CM | POA: Diagnosis present

## 2015-11-23 DIAGNOSIS — S72009A Fracture of unspecified part of neck of unspecified femur, initial encounter for closed fracture: Secondary | ICD-10-CM | POA: Diagnosis not present

## 2015-11-23 DIAGNOSIS — M6281 Muscle weakness (generalized): Secondary | ICD-10-CM | POA: Diagnosis not present

## 2015-11-23 DIAGNOSIS — E1122 Type 2 diabetes mellitus with diabetic chronic kidney disease: Secondary | ICD-10-CM | POA: Diagnosis present

## 2015-11-23 DIAGNOSIS — E119 Type 2 diabetes mellitus without complications: Secondary | ICD-10-CM

## 2015-11-23 DIAGNOSIS — M79661 Pain in right lower leg: Secondary | ICD-10-CM | POA: Diagnosis not present

## 2015-11-23 DIAGNOSIS — R259 Unspecified abnormal involuntary movements: Secondary | ICD-10-CM | POA: Diagnosis not present

## 2015-11-23 DIAGNOSIS — R293 Abnormal posture: Secondary | ICD-10-CM | POA: Diagnosis not present

## 2015-11-23 DIAGNOSIS — Z8673 Personal history of transient ischemic attack (TIA), and cerebral infarction without residual deficits: Secondary | ICD-10-CM

## 2015-11-23 DIAGNOSIS — H9190 Unspecified hearing loss, unspecified ear: Secondary | ICD-10-CM | POA: Diagnosis not present

## 2015-11-23 DIAGNOSIS — Z8249 Family history of ischemic heart disease and other diseases of the circulatory system: Secondary | ICD-10-CM | POA: Diagnosis not present

## 2015-11-23 DIAGNOSIS — R488 Other symbolic dysfunctions: Secondary | ICD-10-CM | POA: Diagnosis not present

## 2015-11-23 DIAGNOSIS — N390 Urinary tract infection, site not specified: Secondary | ICD-10-CM | POA: Diagnosis present

## 2015-11-23 DIAGNOSIS — Z96641 Presence of right artificial hip joint: Secondary | ICD-10-CM | POA: Diagnosis not present

## 2015-11-23 DIAGNOSIS — E43 Unspecified severe protein-calorie malnutrition: Secondary | ICD-10-CM | POA: Diagnosis not present

## 2015-11-23 DIAGNOSIS — M25561 Pain in right knee: Secondary | ICD-10-CM | POA: Diagnosis not present

## 2015-11-23 DIAGNOSIS — R339 Retention of urine, unspecified: Secondary | ICD-10-CM | POA: Diagnosis not present

## 2015-11-23 DIAGNOSIS — R262 Difficulty in walking, not elsewhere classified: Secondary | ICD-10-CM | POA: Diagnosis not present

## 2015-11-23 LAB — CBC WITH DIFFERENTIAL/PLATELET
Basophils Absolute: 0 10*3/uL (ref 0.0–0.1)
Basophils Relative: 0 %
Eosinophils Absolute: 0 10*3/uL (ref 0.0–0.7)
Eosinophils Relative: 0 %
HCT: 35.9 % — ABNORMAL LOW (ref 36.0–46.0)
Hemoglobin: 11.7 g/dL — ABNORMAL LOW (ref 12.0–15.0)
Lymphocytes Relative: 8 %
Lymphs Abs: 1.2 10*3/uL (ref 0.7–4.0)
MCH: 28.2 pg (ref 26.0–34.0)
MCHC: 32.6 g/dL (ref 30.0–36.0)
MCV: 86.5 fL (ref 78.0–100.0)
Monocytes Absolute: 1.2 10*3/uL — ABNORMAL HIGH (ref 0.1–1.0)
Monocytes Relative: 8 %
Neutro Abs: 13.1 10*3/uL — ABNORMAL HIGH (ref 1.7–7.7)
Neutrophils Relative %: 84 %
Platelets: 381 10*3/uL (ref 150–400)
RBC: 4.15 MIL/uL (ref 3.87–5.11)
RDW: 14.1 % (ref 11.5–15.5)
WBC: 15.4 10*3/uL — ABNORMAL HIGH (ref 4.0–10.5)

## 2015-11-23 LAB — COMPREHENSIVE METABOLIC PANEL
ALBUMIN: 3.2 g/dL — AB (ref 3.5–5.0)
ALK PHOS: 95 U/L (ref 38–126)
ALT: 56 U/L — AB (ref 14–54)
AST: 41 U/L (ref 15–41)
Anion gap: 10 (ref 5–15)
BILIRUBIN TOTAL: 1.2 mg/dL (ref 0.3–1.2)
BUN: 15 mg/dL (ref 6–20)
CALCIUM: 8.5 mg/dL — AB (ref 8.9–10.3)
CO2: 25 mmol/L (ref 22–32)
CREATININE: 0.84 mg/dL (ref 0.44–1.00)
Chloride: 101 mmol/L (ref 101–111)
GFR calc Af Amer: >60 mL/min (ref >60)
GFR calc non Af Amer: 60 mL/min — ABNORMAL LOW (ref >60)
GLUCOSE: 185 mg/dL — AB (ref 65–99)
POTASSIUM: 4.4 mmol/L (ref 3.5–5.1)
Sodium: 136 mmol/L (ref 135–145)
TOTAL PROTEIN: 5.9 g/dL — AB (ref 6.5–8.1)

## 2015-11-23 LAB — URINALYSIS, ROUTINE W REFLEX MICROSCOPIC
BILIRUBIN URINE: NEGATIVE
GLUCOSE, UA: NEGATIVE mg/dL
KETONES UR: NEGATIVE mg/dL
Nitrite: POSITIVE — AB
PROTEIN: NEGATIVE mg/dL
Specific Gravity, Urine: 1.01 (ref 1.005–1.030)
pH: 6.5 (ref 5.0–8.0)

## 2015-11-23 LAB — URINE MICROSCOPIC-ADD ON

## 2015-11-23 LAB — GLUCOSE, CAPILLARY: Glucose-Capillary: 148 mg/dL — ABNORMAL HIGH (ref 65–99)

## 2015-11-23 MED ORDER — FENTANYL CITRATE (PF) 100 MCG/2ML IJ SOLN
50.0000 ug | Freq: Once | INTRAMUSCULAR | Status: AC
  Administered 2015-11-23: 50 ug via INTRAVENOUS
  Filled 2015-11-23: qty 2

## 2015-11-23 MED ORDER — LOSARTAN POTASSIUM 50 MG PO TABS
50.0000 mg | ORAL_TABLET | Freq: Every day | ORAL | Status: DC
  Administered 2015-11-24 – 2015-11-28 (×4): 50 mg via ORAL
  Filled 2015-11-23 (×5): qty 1

## 2015-11-23 MED ORDER — ATORVASTATIN CALCIUM 40 MG PO TABS
80.0000 mg | ORAL_TABLET | Freq: Every day | ORAL | Status: DC
  Administered 2015-11-24 – 2015-11-28 (×4): 80 mg via ORAL
  Filled 2015-11-23 (×2): qty 2
  Filled 2015-11-23 (×2): qty 4
  Filled 2015-11-23: qty 2

## 2015-11-23 MED ORDER — POLYETHYLENE GLYCOL 3350 17 G PO PACK: 17.0000 g | PACK | Freq: Every day | ORAL | Status: DC | PRN

## 2015-11-23 MED ORDER — INSULIN ASPART 100 UNIT/ML ~~LOC~~ SOLN
0.0000 [IU] | Freq: Three times a day (TID) | SUBCUTANEOUS | Status: DC
Start: 2015-11-24 — End: 2015-11-28
  Administered 2015-11-24 (×2): 1 [IU] via SUBCUTANEOUS
  Administered 2015-11-24: 2 [IU] via SUBCUTANEOUS
  Administered 2015-11-25: 1 [IU] via SUBCUTANEOUS
  Administered 2015-11-25 – 2015-11-28 (×6): 2 [IU] via SUBCUTANEOUS
  Administered 2015-11-28: 1 [IU] via SUBCUTANEOUS

## 2015-11-23 MED ORDER — DEXTROSE 5 % IV SOLN
1.0000 g | INTRAVENOUS | Status: DC
  Administered 2015-11-23 – 2015-11-25 (×3): 1 g via INTRAVENOUS
  Filled 2015-11-23 (×4): qty 10

## 2015-11-23 MED ORDER — ACETAMINOPHEN 10 MG/ML IV SOLN
1000.0000 mg | INTRAVENOUS | Status: AC
  Administered 2015-11-23: 1000 mg via INTRAVENOUS
  Filled 2015-11-23: qty 100

## 2015-11-23 MED ORDER — TRIMETHOPRIM 100 MG PO TABS
100.0000 mg | ORAL_TABLET | Freq: Every day | ORAL | Status: DC
  Administered 2015-11-24 – 2015-11-27 (×4): 100 mg via ORAL
  Filled 2015-11-23 (×5): qty 1

## 2015-11-23 MED ORDER — HYDROCODONE-ACETAMINOPHEN 5-325 MG PO TABS
1.0000 | ORAL_TABLET | Freq: Four times a day (QID) | ORAL | Status: DC | PRN
  Administered 2015-11-26: 2 via ORAL
  Filled 2015-11-23: qty 2

## 2015-11-23 MED ORDER — SODIUM CHLORIDE 0.9 % IV BOLUS (SEPSIS)
500.0000 mL | Freq: Once | INTRAVENOUS | Status: AC
  Administered 2015-11-23: 500 mL via INTRAVENOUS

## 2015-11-23 MED ORDER — LEVALBUTEROL HCL 0.63 MG/3ML IN NEBU
0.6300 mg | INHALATION_SOLUTION | Freq: Four times a day (QID) | RESPIRATORY_TRACT | Status: DC | PRN
  Filled 2015-11-23: qty 3

## 2015-11-23 MED ORDER — SENNA 8.6 MG PO TABS
1.0000 | ORAL_TABLET | Freq: Two times a day (BID) | ORAL | Status: DC
  Administered 2015-11-24 – 2015-11-25 (×4): 8.6 mg via ORAL
  Filled 2015-11-23 (×6): qty 1

## 2015-11-23 MED ORDER — GABAPENTIN 100 MG PO CAPS
100.0000 mg | ORAL_CAPSULE | Freq: Two times a day (BID) | ORAL | Status: DC
  Administered 2015-11-24 – 2015-11-28 (×7): 100 mg via ORAL
  Filled 2015-11-23 (×9): qty 1

## 2015-11-23 MED ORDER — METOPROLOL TARTRATE 50 MG PO TABS
50.0000 mg | ORAL_TABLET | Freq: Two times a day (BID) | ORAL | Status: DC
  Administered 2015-11-24 – 2015-11-25 (×3): 50 mg via ORAL
  Filled 2015-11-23 (×3): qty 1

## 2015-11-23 MED ORDER — FUROSEMIDE 20 MG PO TABS
20.0000 mg | ORAL_TABLET | Freq: Every day | ORAL | Status: DC
  Administered 2015-11-24 – 2015-11-28 (×5): 20 mg via ORAL
  Filled 2015-11-23 (×5): qty 1

## 2015-11-23 MED ORDER — ENOXAPARIN SODIUM 60 MG/0.6ML ~~LOC~~ SOLN
50.0000 mg | Freq: Two times a day (BID) | SUBCUTANEOUS | Status: DC
  Administered 2015-11-25: 50 mg via SUBCUTANEOUS
  Filled 2015-11-23 (×4): qty 0.6

## 2015-11-23 MED ORDER — MORPHINE SULFATE (PF) 2 MG/ML IV SOLN
0.5000 mg | INTRAVENOUS | Status: DC | PRN
  Administered 2015-11-23: 0.5 mg via INTRAVENOUS
  Filled 2015-11-23: qty 1

## 2015-11-23 MED ORDER — ENOXAPARIN SODIUM 40 MG/0.4ML ~~LOC~~ SOLN: 40.0000 mg | SUBCUTANEOUS | Status: DC

## 2015-11-23 MED ORDER — FENTANYL CITRATE (PF) 100 MCG/2ML IJ SOLN
50.0000 ug | Freq: Once | INTRAMUSCULAR | Status: AC
Start: 2015-11-23 — End: 2015-11-23
  Administered 2015-11-23: 50 ug via INTRAVENOUS
  Filled 2015-11-23: qty 2

## 2015-11-23 MED ORDER — PANTOPRAZOLE SODIUM 40 MG PO TBEC
40.0000 mg | DELAYED_RELEASE_TABLET | Freq: Two times a day (BID) | ORAL | Status: DC
  Administered 2015-11-24 – 2015-11-28 (×8): 40 mg via ORAL
  Filled 2015-11-23 (×9): qty 1

## 2015-11-23 NOTE — ED Provider Notes (Signed)
[facility] DEPT Provider Note   CSN: [id number] Arrival date & time: 11/23/15  1031  By signing my name below, I, Ephriam Jenkins, attest that this documentation has been prepared under the direction and in the presence of Milton Ferguson, MD. Electronically signed, Ephriam Jenkins, ED Scribe. 11/23/15. 11:08 AM.   History   Chief Complaint Chief Complaint  Patient presents with  . Fall    HPI HPI Comments: LEVEL 5 CAVEAT SECONDARY TO CONFUSION Caroline Aguirre is a 82 y.o. female with a PMHx of stroke, who presents to the Emergency Department s/p an unwitnessed fall that occurred this morning. Per pt's cousin the pt was found on the ground next to her bed complaining of pain to her right hip. When asking the pt if she knows what happened this morning, she responds "I don't know". Pt recently started living at [facility] four days ago. Pt's cousin states that she has fallen 3-4 times per day since she started living there. She was seen here in the ED five days ago after episodes of hematemesis. A bleeding ulcer was found and was sent to the nursing home to "build up strength". Pt's PCP is Dr. Luan Pulling.  The patient fell at the nursing home and complains of right hip pain   The history is provided by a relative. No language interpreter was used.  Fall  This is a new problem. The current episode started 6 to 12 hours ago. The problem occurs rarely. The problem has not changed since onset.Pertinent negatives include no chest pain, no abdominal pain and no headaches. Exacerbated by: Movement. Nothing relieves the symptoms. She has tried nothing for the symptoms. The treatment provided no relief.    Past Medical History:  Diagnosis Date  . Arthritis   . Cancer ([place])    Rt Breast  . Coronary artery disease   . Cyst of breast    Right - at 65 yrs of age.  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 Diabetes mellitus without complication ([place])     . Diverticulitis   . Elevated cholesterol   . GERD (gastroesophageal reflux disease)   . Hardening of the arteries of the brain   . Heart disease   . Herniated disc   . Hx Breast cancer, DCIS, Right 08/19/2010  . Hypertension   . Migraine headache   . SBO (small bowel obstruction) ([place]) 09/02/11  . Shingles   . Stroke [facility]) 2004   cerebral hemmorhage    Patient Active Problem List   Diagnosis Date Noted  . Atrial fibrillation ([place]) 11/17/2015  . Personal history of stroke with current residual effects 11/17/2015  . Protein-calorie malnutrition, severe ([place]) 11/17/2015  . Upper GI bleed 11/12/2015  . ARF (acute renal failure) ([place]) 03/16/2015  . Acute encephalopathy 03/16/2015  . DM type 2 (diabetes mellitus, type 2) ([place]) 03/16/2015  . Urinary retention 03/16/2015  . AKI (acute kidney injury) ([place]) 03/16/2015  . Coronary artery disease 03/16/2015  . Essential hypertension 03/16/2015  . Sinus bradycardia 03/16/2015  . Vulvar irritation 07/17/2013  . SBO (small bowel obstruction) ([place]) 09/03/2011  . Cyst of breast, right, solitary 09/07/2010  . Fatigue/loss of sleep 09/07/2010  . High blood pressure 09/07/2010  . Bronchitis 09/07/2010  . Skin moles (abnormal) 09/07/2010  . Gastroesophageal reflux disease with hiatal hernia 09/07/2010  . Hearing loss 09/07/2010  . Wears glasses and/or contacts 09/07/2010  . Glaucoma 09/07/2010  .  Menopause 09/07/2010  . Herniated disc   . Shingles   . Hardening of the arteries of the brain   . Stroke ([place])   . Hx Breast cancer, DCIS, Right 08/19/2010    Past Surgical History:  Procedure Laterality Date  . BRAIN SURGERY     hemorrhage  . BREAST SURGERY  09/25/2010   mastectomy  . CYSTECTOMY    . ESOPHAGOGASTRODUODENOSCOPY N/A 11/13/2015   Procedure: ESOPHAGOGASTRODUODENOSCOPY (EGD);  Surgeon: Rogene Houston, MD;  Location: [facility];  Service: Endoscopy;  Laterality: N/A;  . fibroid tumors    . KNEE ARTHROSCOPY Right    right  knee x 3  . LAPAROSCOPIC BILATERAL SALPINGO OOPHERECTOMY    . SPINE SURGERY     repair herniated disc    OB History    No data available       Home Medications    Prior to Admission medications   Medication Sig Start Date End Date Taking? Authorizing Provider  acetaminophen (TYLENOL) 325 MG tablet Take 325 mg by mouth daily as needed for moderate pain.    Historical Provider, MD  apixaban (ELIQUIS) 2.5 MG TABS tablet Take 1 tablet (2.5 mg total) by mouth 2 (two) times daily. 09/03/15   [name], MD  atorvastatin (LIPITOR) 80 MG tablet Take 1 tablet (80 mg total) by mouth every morning. 03/20/15   Sinda Du, MD  calcium carbonate (TUMS EX) 750 MG chewable tablet Chew 2 tablets by mouth daily.     Historical Provider, MD  furosemide (LASIX) 20 MG tablet Take 1 tablet (20 mg total) by mouth daily. 09/04/15   Josue Hector, MD  gabapentin (NEURONTIN) 100 MG capsule Take 1 capsule (100 mg total) by mouth 2 (two) times daily. 11/19/15   Sinda Du, MD  levalbuterol Penne Lash) 0.63 MG/3ML nebulizer solution Take 3 mLs (0.63 mg total) by nebulization every 6 (six) hours as needed for wheezing or shortness of breath. 11/19/15   Sinda Du, MD  LORazepam (ATIVAN) 0.5 MG tablet Take 1 tablet (0.5 mg total) by mouth every 8 (eight) hours as needed for anxiety. 11/19/15   Sinda Du, MD  losartan (COZAAR) 50 MG tablet Take 1 tablet (50 mg total) by mouth daily. 09/04/15   Josue Hector, MD  Methylcellulose, Laxative, (CITRUCEL) 500 MG TABS Take 1 tablet by mouth daily.     Historical Provider, MD  metoprolol (LOPRESSOR) 50 MG tablet Take 50 mg by mouth 2 (two) times daily.     Historical Provider, MD  Multiple Vitamin (MULTIVITAMIN WITH MINERALS) TABS Take 1 tablet by mouth every morning. *Contains NO Iron (Ferrous Sulfate and/or Iodine)*    Historical Provider, MD  oxybutynin (DITROPAN) 5 MG tablet Take 0.5 tablets (2.5 mg total) by mouth 3 (three) times daily. 11/19/15   Sinda Du, MD  pantoprazole (PROTONIX) 40 MG tablet Take 1 tablet (40 mg total) by mouth 2 (two) times daily. 11/19/15   Sinda Du, MD  trimethoprim (TRIMPEX) 100 MG tablet Take 100 mg by mouth at bedtime.    Historical Provider, MD  vitamin C (ASCORBIC ACID) 500 MG tablet Take 500 mg by mouth every morning.     Historical Provider, MD    Family History Family History  Problem Relation Age of Onset  . Cancer Mother   . Heart disease Father   . Spinal muscular atrophy Sister     spinal meningitis    Social History Social History  Substance Use Topics  . Smoking status:  Never Smoker  . Smokeless tobacco: Never Used  . Alcohol use No     Allergies   Tape; Ciprofloxacin; Sulfa antibiotics; Codeine; and Morphine and related   Review of Systems Review of Systems  Unable to perform ROS: Other (Confused)  Constitutional: Negative for appetite change and fatigue.  HENT: Negative for congestion, ear discharge and sinus pressure.   Eyes: Negative for discharge.  Respiratory: Negative for cough.   Cardiovascular: Negative for chest pain.  Gastrointestinal: Negative for abdominal pain and diarrhea.  Genitourinary: Negative for frequency and hematuria.  Musculoskeletal: Negative for back pain.       Right hip pain  Skin: Negative for rash.  Neurological: Negative for seizures and headaches.  Psychiatric/Behavioral: Negative for hallucinations.     Physical Exam Updated Vital Signs BP 159/89 (BP Location: Right Arm)   Pulse 97   Temp 97.7 F (36.5 C) (Oral)   Resp 16   Ht 5\' 3"  (1.6 m)   Wt 120 lb (54.4 kg)   SpO2 95%   BMI 21.26 kg/m   Physical Exam  Constitutional: She is oriented to person, place, and time. She appears well-developed.  HENT:  Head: Normocephalic.  Eyes: Conjunctivae and EOM are normal. No scleral icterus.  Neck: Neck supple. No thyromegaly present.  Cardiovascular: Normal rate and regular rhythm.  Exam reveals no gallop and no friction rub.     No murmur heard. Pulmonary/Chest: No stridor. She has no wheezes. She has no rales. She exhibits no tenderness.  Abdominal: She exhibits no distension. There is no tenderness. There is no rebound.  Musculoskeletal: Normal range of motion. She exhibits tenderness. She exhibits no edema.  Very tender in right hip and is laterally rotated.   Lymphadenopathy:    She has no cervical adenopathy.  Neurological: She is oriented to person, place, and time. She exhibits normal muscle tone. Coordination normal.  Skin: No rash noted. No erythema.  Psychiatric: She has a normal mood and affect. Her behavior is normal.  Mild confusion.     ED Treatments / Results  DIAGNOSTIC STUDIES: Oxygen Saturation is 95% on RA, normal by my interpretation.  COORDINATION OF CARE: 10:59 AM-Will order imaging. Fentanyl. Discussed treatment plan with pt at bedside and pt agreed to plan.   Labs (all labs ordered are listed, but only abnormal results are displayed) Labs Reviewed - No data to display  EKG  EKG Interpretation None       Radiology No results found.  Procedures Procedures (including critical care time)  Medications Ordered in ED Medications  fentaNYL (SUBLIMAZE) injection 50 mcg (not administered)     Initial Impression / Assessment and Plan / ED Course  I have reviewed the triage vital signs and the nursing notes.  Pertinent labs & imaging results that were available during my care of the patient were reviewed by me and considered in my medical decision making (see chart for details).  Clinical Course    Patient with fractured right hip  Final Clinical Impressions(s) / ED Diagnoses   Final diagnoses:  None  Patient has a right femoral neck fracture. Family requested [facility]. I spoke with Dr. Tonita Cong and he said the patient can be admitted by the hospitalist at [facility] and clinic for orthopedics will take care of her orthopedic problem  New  Prescriptions New Prescriptions   No medications on file  The chart was scribed for me under my direct supervision.  I personally performed the history, physical, and medical  decision making and all procedures in the evaluation of this patient.Milton Ferguson, MD 11/23/15 [phone]

## 2015-11-23 NOTE — ED Notes (Signed)
MD at bedside. 

## 2015-11-23 NOTE — ED Notes (Signed)
[facility] at bedside 

## 2015-11-23 NOTE — ED Triage Notes (Signed)
Pt was at [facility] center for rehab (unknown for what at this time) when she fell on her right side once last night and once this morning. Pt had several portable xrays done with one showing a fracture of her right hip.

## 2015-11-23 NOTE — Progress Notes (Signed)
Nurse called and spoke with Mo, LPN at [facility] to ask when patient last had Elloquis per Dr. Reather Littler request. Per Mo, LPN at [facility], patient had Elloquis 2.5mg  this morning, 11/23/15 at 8:00am. Dr. Tonita Cong is aware of patient's last Elloquis dose.

## 2015-11-23 NOTE — ED Notes (Signed)
Caroline Aguirre at bedside

## 2015-11-23 NOTE — H&P (Signed)
History and Physical  Caroline Aguirre [id number] DOB: [birth year] DOA: 11/23/2015  PCP: Alonza Bogus, MD  Patient coming from: [facility]  Chief Complaint: fall  HPI:  82 year old woman with dementia, coronary artery disease, recent admission for GI bleed, presented to the emergency department after a fall onto her right side at the nursing home. Imaging revealed right hip fracture. Family requested transfer to [facility] for orthopedic care. Orthopedics will see in consultation, requested transfer to [facility] long.  Patient has dementia. Her healthcare power of attorney/second cousin [name] is at bedside and reports that since EGD patient has been confused. Patient cannot provide any history. Patient has been confused at the skilled nursing facility. Per [name] patient has had multiple falls at the skilled nursing facility since discharge. Patient fell this morning and she was notified. She is not aware of any other particular medical problems or acute issues at this point. [place] called by no answer.  ED Course: afebrile, VSS, treated with fentanyl Pertinent labs: Complete metabolic panel with mild elevation of ALT WBC 15.4 Urinalysis grossly abnormal EKG: Independently reviewed. As below Imaging: CXR independently reviewed. Mild CHF, patchy bilateral atelectasis  Review of Systems:  Limited, patient has delirium with dementia unconfirmed provide no history. Per healthcare power of attorney at bedside she's not aware of any problems such as fever. Multiple falls as above.  Past Medical History:  Diagnosis Date  . Arthritis   . Cancer ([place])    Rt Breast  . Coronary artery disease   . Cyst of breast    Right - at 52 yrs of age.  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 Diabetes mellitus without complication ([place])   . Diverticulitis   . Elevated cholesterol   . GERD (gastroesophageal reflux disease)   . Hardening of the arteries of  the brain   . Heart disease   . Herniated disc   . Hx Breast cancer, DCIS, Right 08/19/2010  . Hypertension   . Migraine headache   . SBO (small bowel obstruction) ([place]) 09/02/11  . Shingles   . Stroke [facility]) 2004   cerebral hemmorhage    Past Surgical History:  Procedure Laterality Date  . BRAIN SURGERY     hemorrhage  . BREAST SURGERY  09/25/2010   mastectomy  . CYSTECTOMY    . ESOPHAGOGASTRODUODENOSCOPY N/A 11/13/2015   Procedure: ESOPHAGOGASTRODUODENOSCOPY (EGD);  Surgeon: Rogene Houston, MD;  Location: [facility];  Service: Endoscopy;  Laterality: N/A;  . fibroid tumors    . KNEE ARTHROSCOPY Right    right knee x 3  . LAPAROSCOPIC BILATERAL SALPINGO OOPHERECTOMY    . SPINE SURGERY     repair herniated disc     reports that she has never smoked. She has never used smokeless tobacco. She reports that she does not drink alcohol or use drugs. Ambulatory status: frequent falls  Allergies  Allergen Reactions  . Tape Rash  . Ciprofloxacin   . Sulfa Antibiotics Hives  . Codeine Nausea Only    nausea.  . Morphine And Related Nausea Only    Nausea only.    Family History  Problem Relation Age of Onset  . Cancer Mother   . Heart disease Father   . Spinal muscular atrophy Sister     spinal meningitis     Prior to Admission medications   Medication Sig Start Date End Date Taking? Authorizing Provider  acetaminophen (TYLENOL) 325 MG  tablet Take 325 mg by mouth daily as needed for moderate pain.   Yes Historical Provider, MD  apixaban (ELIQUIS) 2.5 MG TABS tablet Take 1 tablet (2.5 mg total) by mouth 2 (two) times daily. 09/03/15  Yes [name], MD  atorvastatin (LIPITOR) 80 MG tablet Take 1 tablet (80 mg total) by mouth every morning. 03/20/15  Yes Sinda Du, MD  calcium carbonate (TUMS EX) 750 MG chewable tablet Chew 2 tablets by mouth daily.    Yes Historical Provider, MD  furosemide (LASIX) 20 MG tablet Take 1 tablet (20 mg total) by mouth daily. 09/04/15  Yes  Josue Hector, MD  gabapentin (NEURONTIN) 100 MG capsule Take 1 capsule (100 mg total) by mouth 2 (two) times daily. 11/19/15  Yes Sinda Du, MD  levalbuterol Penne Lash) 0.63 MG/3ML nebulizer solution Take 3 mLs (0.63 mg total) by nebulization every 6 (six) hours as needed for wheezing or shortness of breath. 11/19/15  Yes Sinda Du, MD  LORazepam (ATIVAN) 0.5 MG tablet Take 1 tablet (0.5 mg total) by mouth every 8 (eight) hours as needed for anxiety. 11/19/15  Yes Sinda Du, MD  losartan (COZAAR) 50 MG tablet Take 1 tablet (50 mg total) by mouth daily. 09/04/15  Yes Josue Hector, MD  Methylcellulose, Laxative, (CITRUCEL) 500 MG TABS Take 1 tablet by mouth daily.    Yes Historical Provider, MD  metoprolol (LOPRESSOR) 50 MG tablet Take 50 mg by mouth 2 (two) times daily.    Yes Historical Provider, MD  Multiple Vitamin (MULTIVITAMIN WITH MINERALS) TABS Take 1 tablet by mouth every morning. *Contains NO Iron (Ferrous Sulfate and/or Iodine)*   Yes Historical Provider, MD  oxybutynin (DITROPAN) 5 MG tablet Take 0.5 tablets (2.5 mg total) by mouth 3 (three) times daily. 11/19/15  Yes Sinda Du, MD  pantoprazole (PROTONIX) 40 MG tablet Take 1 tablet (40 mg total) by mouth 2 (two) times daily. 11/19/15  Yes Sinda Du, MD  trimethoprim (TRIMPEX) 100 MG tablet Take 100 mg by mouth at bedtime.   Yes Historical Provider, MD  vitamin C (ASCORBIC ACID) 500 MG tablet Take 500 mg by mouth every morning.    Yes Historical Provider, MD    Physical Exam: Vitals:   11/23/15 1035 11/23/15 1200 11/23/15 1300 11/23/15 1400  BP: 159/89 134/96 131/62 116/60  Pulse: 97 93 89 94  Resp: 16 25 17 20   Temp: 97.7 F (36.5 C)     TempSrc: Oral     SpO2: 95% 96% 98% 95%  Weight:      Height:        Constitutional:  . Appears anxious, restless, uncomfortable but not toxic Eyes:  . PERRL and irises appear normal . Normal conjunctivae and lids ENMT:  . external ears, nose appear normal . Hard of  hearing Neck:  . neck appears unremarkable except for thyromegaly . Thyromegaly noted Respiratory:  . CTA bilaterally, no w/r/r.  . Respiratory effort normal. No retractions or accessory muscle use Cardiovascular:  . RRR, no m/r/g . No LE extremity edema   . Normal pedal pulses Abdomen:  . Abdomen appears normal; no tenderness or masses . No hernias noted Musculoskeletal:  . RUE, LUE, RLE, LLE   o strength and tone appear normal normal, exam limited by pain, no atrophy, no abnormal movements o Right leg is shortened and externally rotated Skin:  . No rashes, lesions, ulcers . palpation of skin: no induration or nodules Neurologic:  . Appears grossly unremarkable Psychiatric:  . judgement  and insight impaired, patient is confused . Mental status o Disoriented, confused, responses and appropriate o Mood anxious  Wt Readings from Last 3 Encounters:  11/23/15 54.4 kg (120 lb)  11/14/15 53.7 kg (118 lb 6.2 oz)  09/04/15 54.4 kg (120 lb)    I have personally reviewed following labs and imaging studies  Labs on Admission:  CBC:  Recent Labs Lab 11/17/15 0600 11/23/15 1113  WBC 14.6* 15.4*  NEUTROABS 11.5* 13.1*  HGB 12.0 11.7*  HCT 37.9 35.9*  MCV 88.6 86.5  PLT 312 [id number]   Basic Metabolic Panel:  Recent Labs Lab 11/17/15 0600 11/23/15 1113  NA 139 136  K 3.8 4.4  CL 104 101  CO2 25 25  GLUCOSE 121* 185*  BUN 19 15  CREATININE 0.83 0.84  CALCIUM 8.8* 8.5*   Liver Function Tests:  Recent Labs Lab 11/23/15 1113  AST 41  ALT 56*  ALKPHOS 95  BILITOT 1.2  PROT 5.9*  ALBUMIN 3.2*   Urine analysis:    Component Value Date/Time   COLORURINE YELLOW 11/23/2015 1310   APPEARANCEUR HAZY (A) 11/23/2015 1310   LABSPEC 1.010 11/23/2015 1310   PHURINE 6.5 11/23/2015 1310   GLUCOSEU NEGATIVE 11/23/2015 1310   HGBUR TRACE (A) 11/23/2015 1310   BILIRUBINUR NEGATIVE 11/23/2015 1310   KETONESUR NEGATIVE 11/23/2015 1310   PROTEINUR NEGATIVE 11/23/2015 1310     UROBILINOGEN 0.2 11/16/2014 0939   NITRITE POSITIVE (A) 11/23/2015 1310   LEUKOCYTESUR LARGE (A) 11/23/2015 1310    Radiological Exams on Admission: Dg Chest 1 View  Result Date: 11/23/2015 CLINICAL DATA:  Status post fall today with right hip pain EXAM: CHEST 1 VIEW COMPARISON:  September 03, 2015 FINDINGS: The heart size and mediastinal contours are stable. The heart size is enlarged. There is consolidation of bilateral lung bases with bilateral pleural effusions. There is mild interstitial edema. The bones are stable. IMPRESSION: Mild congestive heart failure. Patchy consolidation of bilateral lung bases with bilateral pleural effusions, underlying pneumonia is not excluded. Electronically Signed   By: Abelardo Diesel M.D.   On: 11/23/2015 11:40   Ct Head Wo Contrast  Result Date: 11/23/2015 CLINICAL DATA:  82 year old female with a history of fall. Right hip pain EXAM: CT HEAD WITHOUT CONTRAST CT CERVICAL SPINE WITHOUT CONTRAST TECHNIQUE: Multidetector CT imaging of the head and cervical spine was performed following the standard protocol without intravenous contrast. Multiplanar CT image reconstructions of the cervical spine were also generated. COMPARISON:  MR brain 03/18/2015, head CT 03/16/2015 FINDINGS: CT HEAD FINDINGS Unremarkable appearance of the calvarium without acute fracture or aggressive lesion. Unremarkable appearance of the scalp soft tissues. Unremarkable appearance of the bilateral orbits. Mastoid air cells are clear. No significant paranasal sinus disease Small calcified meningioma along the inner table of the left temporal bone, unchanged. No local mass effect. No acute intracranial hemorrhage, midline shift, or mass effect. Unchanged configuration of the ventricles. Gray-white differentiation maintained. Confluent hypodensity in the periventricular white matter, most pronounced in the posterior right parietal and occipital lobe. Hyperdense cortex of the right occipital region unchanged  from the comparison. No local mass effect. Dense intracranial atherosclerosis. CT CERVICAL SPINE FINDINGS Craniocervical junction aligned. No acute fracture at the skullbase identified. Anatomic alignment of the cervical elements is relatively maintained. No subluxation. Vertebral body heights maintained. No fracture line identified. Alignment of the facets maintain. Multilevel degenerative changes throughout the cervical spine with disc space narrowing, uncovertebral joint disease, and posterior disc osteophyte complexes. No significant bony canal  narrowing. Uncovertebral joint disease and facet disease contributes to varying degrees of bilateral neural foraminal narrowing. No evidence of epidural hemorrhage. Right thyroid cyst. Irregular parenchyma of the bilateral thyroid. Calcifications of the carotid vasculature. Bilateral pleural effusions layer dependently at the lung apices. IMPRESSION: Head CT: No CT evidence of acute intracranial abnormality. Similar changes of chronic white matter disease and mineralization of cortex in the right parieto-occipital region. Intracranial atherosclerosis. Cervical spine CT: No CT evidence of acute fracture or malalignment of the cervical spine. Multilevel degenerative disc disease, uncovertebral joint disease, as well as facet changes contribute to varying degrees of bilateral neural foraminal narrowing of the cervical spine. Bilateral pleural effusions layered at the lung apices. Nodular and cystic changes of the thyroid. If clinically warranted, further evaluation with ultrasound may be considered. Signed, Dulcy Fanny. Earleen Newport, DO [facility] Electronically Signed   By: Corrie Mckusick D.O.   On: 11/23/2015 12:13   Ct Cervical Spine Wo Contrast  Result Date: 11/23/2015 CLINICAL DATA:  82 year old female with a history of fall. Right hip pain EXAM: CT HEAD WITHOUT CONTRAST CT CERVICAL SPINE WITHOUT CONTRAST TECHNIQUE:  Multidetector CT imaging of the head and cervical spine was performed following the standard protocol without intravenous contrast. Multiplanar CT image reconstructions of the cervical spine were also generated. COMPARISON:  MR brain 03/18/2015, head CT 03/16/2015 FINDINGS: CT HEAD FINDINGS Unremarkable appearance of the calvarium without acute fracture or aggressive lesion. Unremarkable appearance of the scalp soft tissues. Unremarkable appearance of the bilateral orbits. Mastoid air cells are clear. No significant paranasal sinus disease Small calcified meningioma along the inner table of the left temporal bone, unchanged. No local mass effect. No acute intracranial hemorrhage, midline shift, or mass effect. Unchanged configuration of the ventricles. Gray-white differentiation maintained. Confluent hypodensity in the periventricular white matter, most pronounced in the posterior right parietal and occipital lobe. Hyperdense cortex of the right occipital region unchanged from the comparison. No local mass effect. Dense intracranial atherosclerosis. CT CERVICAL SPINE FINDINGS Craniocervical junction aligned. No acute fracture at the skullbase identified. Anatomic alignment of the cervical elements is relatively maintained. No subluxation. Vertebral body heights maintained. No fracture line identified. Alignment of the facets maintain. Multilevel degenerative changes throughout the cervical spine with disc space narrowing, uncovertebral joint disease, and posterior disc osteophyte complexes. No significant bony canal narrowing. Uncovertebral joint disease and facet disease contributes to varying degrees of bilateral neural foraminal narrowing. No evidence of epidural hemorrhage. Right thyroid cyst. Irregular parenchyma of the bilateral thyroid. Calcifications of the carotid vasculature. Bilateral pleural effusions layer dependently at the lung apices. IMPRESSION: Head CT: No CT evidence of acute intracranial  abnormality. Similar changes of chronic white matter disease and mineralization of cortex in the right parieto-occipital region. Intracranial atherosclerosis. Cervical spine CT: No CT evidence of acute fracture or malalignment of the cervical spine. Multilevel degenerative disc disease, uncovertebral joint disease, as well as facet changes contribute to varying degrees of bilateral neural foraminal narrowing of the cervical spine. Bilateral pleural effusions layered at the lung apices. Nodular and cystic changes of the thyroid. If clinically warranted, further evaluation with ultrasound may be considered. Signed, Dulcy Fanny. Earleen Newport, DO [facility] Electronically Signed   By: Corrie Mckusick D.O.   On: 11/23/2015 12:13   Dg Hip Unilat  With Pelvis 2-3 Views Right  Result Date: 11/23/2015 CLINICAL DATA:  Status post fall today with right hip pain EXAM: DG HIP (WITH OR WITHOUT  PELVIS) 2-3V RIGHT COMPARISON:  None. FINDINGS: There is displaced fracture at the base of the right femoral neck. There is no dislocation. IMPRESSION: Fracture at the base of the right femoral neck. Electronically Signed   By: Abelardo Diesel M.D.   On: 11/23/2015 11:39    EKG: Independently reviewed. Afib, normal rate, no acute changes  Principal Problem:   Closed right hip fracture (HCC) Active Problems:   Hearing loss   Acute encephalopathy   DM type 2 (diabetes mellitus, type 2) (HCC)   Coronary artery disease   Atrial fibrillation (HCC)   Assessment/Plan 1. Right femoral neck fracture. 2. UTI with delirium 3. DM type 2, stable.  4. 10/2015: S/p UGIB secondary to gastric fundal AV malformation s/p therapeutic EGD. Eliquis resumed prior to d/c. 5. PMH CAD, 08/2015 Echo LVEF 55-60% ith normal wall motion, moderate to severe tricuspid regurgitation. 6. Dementia with subacute delirium/acute encephalopathy s/p EGD. Stop lorazepam, oxybutynin. Treat UTI. 7. Atrial  fibrillation on apixaban. 8. Nodular and cystic changes of the thyroid. If clinically warranted, further evaluation with ultrasound may be considered   Appears clinically stable. Family has requested orthopedics in [place]. Dr. Tonita Cong will see in consultation and requests [facility]. POA agrees. Accepting physician: Dr. Cathlean Sauer.  Pain control, hip fracture protocol, stop Eliquis. Patient has dementia but there is no known history of recent chest pain or pulmonary issues. Echocardiogram as above was overall reassuring. Would not recommend any further evaluation. Discussed risks/benefits of surgery, patient at increased risk because of age and comorbidities. POA except stress.  Treat UTI. Send urine culture.  Sliding scale insulin  Continue chronic medications  DVT prophylaxis: Lovenox Code Status: DNR Family Communication: second cousin/HCPOA Caroline Aguirre at bedside Disposition Plan: admit [facility]   Consults called: ortho   Admission status: admit    Time spent: 60 minutes  Murray Hodgkins, MD  [facility] Direct contact: [phone] --[place] [place]  --[email or web address]; password [email or web address]  7PM-7AM contact night coverage as above  11/23/2015, 3:50 PM

## 2015-11-23 NOTE — Consult Note (Signed)
Reason for Consult: Right hip fracture  Referring Physician: EDP  Caroline Aguirre is an 82 y.o. female.  HPI: [facility] multiple times at Rehab. Recent mini strokes.  Past Medical History:  Diagnosis Date  . Arthritis   . Cancer ([place])    Rt Breast  . Coronary artery disease   . Cyst of breast    Right - at 74 yrs of age.  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 Diabetes mellitus without complication ([place])   . Diverticulitis   . Elevated cholesterol   . GERD (gastroesophageal reflux disease)   . Hardening of the arteries of the brain   . Heart disease   . Herniated disc   . Hx Breast cancer, DCIS, Right 08/19/2010  . Hypertension   . Migraine headache   . SBO (small bowel obstruction) ([place]) 09/02/11  . Shingles   . Stroke [facility]) 2004   cerebral hemmorhage    Past Surgical History:  Procedure Laterality Date  . BRAIN SURGERY     hemorrhage  . BREAST SURGERY  09/25/2010   mastectomy  . CYSTECTOMY    . ESOPHAGOGASTRODUODENOSCOPY N/A 11/13/2015   Procedure: ESOPHAGOGASTRODUODENOSCOPY (EGD);  Surgeon: Rogene Houston, MD;  Location: [facility];  Service: Endoscopy;  Laterality: N/A;  . fibroid tumors    . KNEE ARTHROSCOPY Right    right knee x 3  . LAPAROSCOPIC BILATERAL SALPINGO OOPHERECTOMY    . SPINE SURGERY     repair herniated disc    Family History  Problem Relation Age of Onset  . Cancer Mother   . Heart disease Father   . Spinal muscular atrophy Sister     spinal meningitis    Social History:  reports that she has never smoked. She has never used smokeless tobacco. She reports that she does not drink alcohol or use drugs.  Allergies:  Allergies  Allergen Reactions  . Tape Rash  . Ciprofloxacin   . Sulfa Antibiotics Hives  . Codeine Nausea Only    nausea.  . Morphine And Related Nausea Only    Nausea only.    Medications: I have reviewed the patient's current medications.  Results for orders placed or  performed during the hospital encounter of 11/23/15 (from the past 48 hour(s))  CBC with Differential/Platelet     Status: Abnormal   Collection Time: 11/23/15 11:13 AM  Result Value Ref Range   WBC 15.4 (H) 4.0 - 10.5 K/uL   RBC 4.15 3.87 - 5.11 MIL/uL   Hemoglobin 11.7 (L) 12.0 - 15.0 g/dL   HCT 35.9 (L) 36.0 - 46.0 %   MCV 86.5 78.0 - 100.0 fL   MCH 28.2 26.0 - 34.0 pg   MCHC 32.6 30.0 - 36.0 g/dL   RDW 14.1 11.5 - 15.5 %   Platelets 381 150 - 400 K/uL   Neutrophils Relative % 84 %   Neutro Abs 13.1 (H) 1.7 - 7.7 K/uL   Lymphocytes Relative 8 %   Lymphs Abs 1.2 0.7 - 4.0 K/uL   Monocytes Relative 8 %   Monocytes Absolute 1.2 (H) 0.1 - 1.0 K/uL   Eosinophils Relative 0 %   Eosinophils Absolute 0.0 0.0 - 0.7 K/uL   Basophils Relative 0 %   Basophils Absolute 0.0 0.0 - 0.1 K/uL  Comprehensive metabolic panel     Status: Abnormal   Collection Time: 11/23/15 11:13 AM  Result Value Ref Range   Sodium  136 135 - 145 mmol/L   Potassium 4.4 3.5 - 5.1 mmol/L   Chloride 101 101 - 111 mmol/L   CO2 25 22 - 32 mmol/L   Glucose, Bld 185 (H) 65 - 99 mg/dL   BUN 15 6 - 20 mg/dL   Creatinine, Ser 0.84 0.44 - 1.00 mg/dL   Calcium 8.5 (L) 8.9 - 10.3 mg/dL   Total Protein 5.9 (L) 6.5 - 8.1 g/dL   Albumin 3.2 (L) 3.5 - 5.0 g/dL   AST 41 15 - 41 U/L   ALT 56 (H) 14 - 54 U/L   Alkaline Phosphatase 95 38 - 126 U/L   Total Bilirubin 1.2 0.3 - 1.2 mg/dL   GFR calc non Af Amer 60 (L) >60 mL/min   GFR calc Af Amer >60 >60 mL/min    Comment: (NOTE) The eGFR has been calculated using the CKD EPI equation. This calculation has not been validated in all clinical situations. eGFR's persistently <60 mL/min signify possible Chronic Kidney Disease.    Anion gap 10 5 - 15    Dg Chest 1 View  Result Date: 11/23/2015 CLINICAL DATA:  Status post fall today with right hip pain EXAM: CHEST 1 VIEW COMPARISON:  September 03, 2015 FINDINGS: The heart size and mediastinal contours are stable. The heart size is  enlarged. There is consolidation of bilateral lung bases with bilateral pleural effusions. There is mild interstitial edema. The bones are stable. IMPRESSION: Mild congestive heart failure. Patchy consolidation of bilateral lung bases with bilateral pleural effusions, underlying pneumonia is not excluded. Electronically Signed   By: Abelardo Diesel M.D.   On: 11/23/2015 11:40   Ct Head Wo Contrast  Result Date: 11/23/2015 CLINICAL DATA:  82 year old female with a history of fall. Right hip pain EXAM: CT HEAD WITHOUT CONTRAST CT CERVICAL SPINE WITHOUT CONTRAST TECHNIQUE: Multidetector CT imaging of the head and cervical spine was performed following the standard protocol without intravenous contrast. Multiplanar CT image reconstructions of the cervical spine were also generated. COMPARISON:  MR brain 03/18/2015, head CT 03/16/2015 FINDINGS: CT HEAD FINDINGS Unremarkable appearance of the calvarium without acute fracture or aggressive lesion. Unremarkable appearance of the scalp soft tissues. Unremarkable appearance of the bilateral orbits. Mastoid air cells are clear. No significant paranasal sinus disease Small calcified meningioma along the inner table of the left temporal bone, unchanged. No local mass effect. No acute intracranial hemorrhage, midline shift, or mass effect. Unchanged configuration of the ventricles. Gray-white differentiation maintained. Confluent hypodensity in the periventricular white matter, most pronounced in the posterior right parietal and occipital lobe. Hyperdense cortex of the right occipital region unchanged from the comparison. No local mass effect. Dense intracranial atherosclerosis. CT CERVICAL SPINE FINDINGS Craniocervical junction aligned. No acute fracture at the skullbase identified. Anatomic alignment of the cervical elements is relatively maintained. No subluxation. Vertebral body heights maintained. No fracture line identified. Alignment of the facets maintain. Multilevel  degenerative changes throughout the cervical spine with disc space narrowing, uncovertebral joint disease, and posterior disc osteophyte complexes. No significant bony canal narrowing. Uncovertebral joint disease and facet disease contributes to varying degrees of bilateral neural foraminal narrowing. No evidence of epidural hemorrhage. Right thyroid cyst. Irregular parenchyma of the bilateral thyroid. Calcifications of the carotid vasculature. Bilateral pleural effusions layer dependently at the lung apices. IMPRESSION: Head CT: No CT evidence of acute intracranial abnormality. Similar changes of chronic white matter disease and mineralization of cortex in the right parieto-occipital region. Intracranial atherosclerosis. Cervical spine CT: No CT evidence  of acute fracture or malalignment of the cervical spine. Multilevel degenerative disc disease, uncovertebral joint disease, as well as facet changes contribute to varying degrees of bilateral neural foraminal narrowing of the cervical spine. Bilateral pleural effusions layered at the lung apices. Nodular and cystic changes of the thyroid. If clinically warranted, further evaluation with ultrasound may be considered. Signed, Dulcy Fanny. Earleen Newport, DO [facility] Electronically Signed   By: Corrie Mckusick D.O.   On: 11/23/2015 12:13   Ct Cervical Spine Wo Contrast  Result Date: 11/23/2015 CLINICAL DATA:  82 year old female with a history of fall. Right hip pain EXAM: CT HEAD WITHOUT CONTRAST CT CERVICAL SPINE WITHOUT CONTRAST TECHNIQUE: Multidetector CT imaging of the head and cervical spine was performed following the standard protocol without intravenous contrast. Multiplanar CT image reconstructions of the cervical spine were also generated. COMPARISON:  MR brain 03/18/2015, head CT 03/16/2015 FINDINGS: CT HEAD FINDINGS Unremarkable appearance of the calvarium without acute fracture or aggressive lesion.  Unremarkable appearance of the scalp soft tissues. Unremarkable appearance of the bilateral orbits. Mastoid air cells are clear. No significant paranasal sinus disease Small calcified meningioma along the inner table of the left temporal bone, unchanged. No local mass effect. No acute intracranial hemorrhage, midline shift, or mass effect. Unchanged configuration of the ventricles. Gray-white differentiation maintained. Confluent hypodensity in the periventricular white matter, most pronounced in the posterior right parietal and occipital lobe. Hyperdense cortex of the right occipital region unchanged from the comparison. No local mass effect. Dense intracranial atherosclerosis. CT CERVICAL SPINE FINDINGS Craniocervical junction aligned. No acute fracture at the skullbase identified. Anatomic alignment of the cervical elements is relatively maintained. No subluxation. Vertebral body heights maintained. No fracture line identified. Alignment of the facets maintain. Multilevel degenerative changes throughout the cervical spine with disc space narrowing, uncovertebral joint disease, and posterior disc osteophyte complexes. No significant bony canal narrowing. Uncovertebral joint disease and facet disease contributes to varying degrees of bilateral neural foraminal narrowing. No evidence of epidural hemorrhage. Right thyroid cyst. Irregular parenchyma of the bilateral thyroid. Calcifications of the carotid vasculature. Bilateral pleural effusions layer dependently at the lung apices. IMPRESSION: Head CT: No CT evidence of acute intracranial abnormality. Similar changes of chronic white matter disease and mineralization of cortex in the right parieto-occipital region. Intracranial atherosclerosis. Cervical spine CT: No CT evidence of acute fracture or malalignment of the cervical spine. Multilevel degenerative disc disease, uncovertebral joint disease, as well as facet changes contribute to varying degrees of bilateral  neural foraminal narrowing of the cervical spine. Bilateral pleural effusions layered at the lung apices. Nodular and cystic changes of the thyroid. If clinically warranted, further evaluation with ultrasound may be considered. Signed, Dulcy Fanny. Earleen Newport, DO [facility] Radiology Electronically Signed   By: Corrie Mckusick D.O.   On: 11/23/2015 12:13   Dg Hip Unilat  With Pelvis 2-3 Views Right  Result Date: 11/23/2015 CLINICAL DATA:  Status post fall today with right hip pain EXAM: DG HIP (WITH OR WITHOUT PELVIS) 2-3V RIGHT COMPARISON:  None. FINDINGS: There is displaced fracture at the base of the right femoral neck. There is no dislocation. IMPRESSION: Fracture at the base of the right femoral neck. Electronically Signed   By: Abelardo Diesel M.D.   On: 11/23/2015 11:39    Review of Systems  Constitutional: Positive for malaise/fatigue.  Musculoskeletal: Positive for joint pain.  All other systems reviewed and are negative.  Blood pressure 131/62, pulse 89,  temperature 97.7 F (36.5 C), temperature source Oral, resp. rate 17, height _0  (1.6 m), weight 54.4 kg (120 lb), SpO2 98 %. Physical Exam  Constitutional: She appears well-developed.  HENT:  Head: Normocephalic.  Eyes: Pupils are equal, round, and reactive to light.  Neck: Normal range of motion.  Cardiovascular: Normal rate.   Respiratory: Effort normal.  GI: Soft.  Musculoskeletal: She exhibits edema and tenderness.  Right leg shortened and externally rotated. Neurovascularly intact. No DVT.  Neurological: She is alert.  Skin: Skin is warm and dry.  Psychiatric: She has a normal mood and affect.    Assessment/Plan: Right femoral neck fracture displaced, Will require Hemiarthroplasty after coagulopathy resolves. Discussed with family.  Jeziel Hoffmann C 11/23/2015, 2:05 PM  [phone]

## 2015-11-23 NOTE — ED Notes (Signed)
Pt is becoming restless in the bed. Grimacing is noted. MD made aware.

## 2015-11-23 NOTE — ED Notes (Signed)
Pt not in room. Delay in getting EKG.

## 2015-11-23 NOTE — ED Notes (Signed)
Departure condition entered on wrong pt.

## 2015-11-24 ENCOUNTER — Encounter (HOSPITAL_COMMUNITY): Payer: Self-pay

## 2015-11-24 LAB — CBC
HCT: 35.8 % — ABNORMAL LOW (ref 36.0–46.0)
Hemoglobin: 11.7 g/dL — ABNORMAL LOW (ref 12.0–15.0)
MCH: 27.6 pg (ref 26.0–34.0)
MCHC: 32.7 g/dL (ref 30.0–36.0)
MCV: 84.4 fL (ref 78.0–100.0)
PLATELETS: 355 10*3/uL (ref 150–400)
RBC: 4.24 MIL/uL (ref 3.87–5.11)
RDW: 14.1 % (ref 11.5–15.5)
WBC: 17.1 10*3/uL — AB (ref 4.0–10.5)

## 2015-11-24 LAB — BASIC METABOLIC PANEL
ANION GAP: 10 (ref 5–15)
BUN: 17 mg/dL (ref 6–20)
CALCIUM: 8.9 mg/dL (ref 8.9–10.3)
CO2: 26 mmol/L (ref 22–32)
CREATININE: 1.01 mg/dL — AB (ref 0.44–1.00)
Chloride: 105 mmol/L (ref 101–111)
GFR, EST AFRICAN AMERICAN: 55 mL/min — AB (ref >60)
GFR, EST NON AFRICAN AMERICAN: 48 mL/min — AB (ref >60)
Glucose, Bld: 175 mg/dL — ABNORMAL HIGH (ref 65–99)
Potassium: 4.4 mmol/L (ref 3.5–5.1)
Sodium: 141 mmol/L (ref 135–145)

## 2015-11-24 LAB — SURGICAL PCR SCREEN
MRSA, PCR: NEGATIVE
STAPHYLOCOCCUS AUREUS: POSITIVE — AB

## 2015-11-24 LAB — GLUCOSE, CAPILLARY
Glucose-Capillary: 146 mg/dL — ABNORMAL HIGH (ref 65–99)
Glucose-Capillary: 152 mg/dL — ABNORMAL HIGH (ref 65–99)
Glucose-Capillary: 134 mg/dL — ABNORMAL HIGH (ref 65–99)
Glucose-Capillary: 129 mg/dL — ABNORMAL HIGH (ref 65–99)

## 2015-11-24 MED ORDER — FENTANYL CITRATE (PF) 100 MCG/2ML IJ SOLN
25.0000 ug | INTRAMUSCULAR | Status: DC | PRN
  Administered 2015-11-24: 50 ug via INTRAVENOUS
  Administered 2015-11-24 (×2): 25 ug via INTRAVENOUS
  Administered 2015-11-24 – 2015-11-26 (×17): 50 ug via INTRAVENOUS
  Filled 2015-11-24 (×20): qty 2

## 2015-11-24 MED ORDER — METOPROLOL TARTRATE 5 MG/5ML IV SOLN
5.0000 mg | Freq: Once | INTRAVENOUS | Status: AC
  Administered 2015-11-24: 5 mg via INTRAVENOUS
  Filled 2015-11-24: qty 5

## 2015-11-24 MED ORDER — PROMETHAZINE HCL 25 MG/ML IJ SOLN: 12.5000 mg | Freq: Once | INTRAMUSCULAR | Status: DC

## 2015-11-24 MED ORDER — GABAPENTIN 250 MG/5ML PO SOLN
100.0000 mg | Freq: Once | ORAL | Status: AC
  Administered 2015-11-24: 100 mg via ORAL
  Filled 2015-11-24: qty 2

## 2015-11-24 MED ORDER — ONDANSETRON HCL 4 MG/2ML IJ SOLN
4.0000 mg | Freq: Four times a day (QID) | INTRAMUSCULAR | Status: DC | PRN
  Administered 2015-11-24: 4 mg via INTRAVENOUS
  Filled 2015-11-24: qty 2

## 2015-11-24 NOTE — Progress Notes (Signed)
PROGRESS NOTE    Caroline Aguirre  [id number] DOB: Sep 04, 1924 DOA: 11/23/2015 PCP: Alonza Bogus, MD   Brief Narrative:  82 year old woman with dementia, coronary artery disease, recent admission for GI bleed, presented to the emergency department after a fall onto her right side at the nursing home. Imaging revealed right hip fracture  Assessment & Plan:   Principal Problem:   Closed right hip fracture ([place]) - ortho on board and assisting.  - Continue supportive therapy  UTI - continue Rocephin   Active Problems:   Hearing loss   Acute encephalopathy - may be secondary to infectious etiology    DM type 2 (diabetes mellitus, type 2) ([place]) - Continue diabetic diet once patient okay to eat by surgeon    Coronary artery disease - Continue statin    Atrial fibrillation (H[facility]) - Continue metoprolol   DVT prophylaxis: Lovenox Code Status: DNR Family Communication: d/c family member at bedside Disposition Plan: pending recommendations by ortho   Consultants:   Orthopedic surgery   Procedures: Pending   Antimicrobials: Rocephin, trimethoprim   Subjective: Patient has no new complaints. No acute issues overnight  Objective: Vitals:   11/23/15 2300 11/24/15 0208 11/24/15 0645 11/24/15 1346  BP: (!) 142/90 128/74 118/73 (!) 138/55  Pulse: (!) 145 (!) 139 (!) 102 (!) 116  Resp: 20 18 18 16   Temp: 99.8 F (37.7 C) 97.7 F (36.5 C) 97.4 F (36.3 C) 97.3 F (36.3 C)  TempSrc: Axillary Axillary Axillary Oral  SpO2: 93% 93% 95% 96%  Weight:      Height:        Intake/Output Summary (Last 24 hours) at 11/24/15 1627 Last data filed at 11/24/15 1300  Gross per 24 hour  Intake              220 ml  Output              150 ml  Net               70 ml   Filed Weights   11/23/15 1032  Weight: 54.4 kg (120 lb)    Examination:  General exam: Appears calm and comfortable, In no acute distress  Respiratory system: Clear to auscultation. Respiratory effort  normal. No wheezes Cardiovascular system: S1 & S2 No JVD, murmurs, rubs,  Gastrointestinal system: Abdomen is nondistended, soft and nontender. No organomegaly or masses felt. Normal bowel sounds heard. Central nervous system: Alert difficult to examine secondary to altered mental status Extremities: No contractures Skin: No rashes, lesions or ulcers on limited exam Psychiatry: Unable to assess accurately secondary to altered mental status    Data Reviewed: I have personally reviewed following labs and imaging studies  CBC:  Recent Labs Lab 11/23/15 1113 11/24/15 0439  WBC 15.4* 17.1*  NEUTROABS 13.1*  --   HGB 11.7* 11.7*  HCT 35.9* 35.8*  MCV 86.5 84.4  PLT 381 [id number]   Basic Metabolic Panel:  Recent Labs Lab 11/23/15 1113 11/24/15 0439  NA 136 141  K 4.4 4.4  CL 101 105  CO2 25 26  GLUCOSE 185* 175*  BUN 15 17  CREATININE 0.84 1.01*  CALCIUM 8.5* 8.9   GFR: Estimated Creatinine Clearance: 31.2 mL/min (by C-G formula based on SCr of 1.01 mg/dL). Liver Function Tests:  Recent Labs Lab 11/23/15 1113  AST 41  ALT 56*  ALKPHOS 95  BILITOT 1.2  PROT 5.9*  ALBUMIN 3.2*   No results for input(s): LIPASE, AMYLASE in the last  168 hours. No results for input(s): AMMONIA in the last 168 hours. Coagulation Profile: No results for input(s): INR, PROTIME in the last 168 hours. Cardiac Enzymes: No results for input(s): CKTOTAL, CKMB, CKMBINDEX, TROPONINI in the last 168 hours. BNP (last 3 results) No results for input(s): PROBNP in the last 8760 hours. HbA1C: No results for input(s): HGBA1C in the last 72 hours. CBG:  Recent Labs Lab 11/23/15 2237 11/24/15 0743 11/24/15 1217  GLUCAP 148* 146* 152*   Lipid Profile: No results for input(s): CHOL, HDL, LDLCALC, TRIG, CHOLHDL, LDLDIRECT in the last 72 hours. Thyroid Function Tests: No results for input(s): TSH, T4TOTAL, FREET4, T3FREE, THYROIDAB in the last 72 hours. Anemia Panel: No results for input(s):  VITAMINB12, FOLATE, FERRITIN, TIBC, IRON, RETICCTPCT in the last 72 hours. Sepsis Labs: No results for input(s): PROCALCITON, LATICACIDVEN in the last 168 hours.  Recent Results (from the past 240 hour(s))  Surgical PCR screen     Status: Abnormal   Collection Time: 11/24/15  8:40 AM  Result Value Ref Range Status   MRSA, PCR NEGATIVE NEGATIVE Final   Staphylococcus aureus POSITIVE (A) NEGATIVE Final    Comment:        The Xpert SA Assay (FDA approved for NASAL specimens in patients over 8 years of age), is one component of a comprehensive surveillance program.  Test performance has been validated by [facility] for patients greater than or equal to 55 year old. It is not intended to diagnose infection nor to guide or monitor treatment.          Radiology Studies: Dg Chest 1 View  Result Date: 11/23/2015 CLINICAL DATA:  Status post fall today with right hip pain EXAM: CHEST 1 VIEW COMPARISON:  September 03, 2015 FINDINGS: The heart size and mediastinal contours are stable. The heart size is enlarged. There is consolidation of bilateral lung bases with bilateral pleural effusions. There is mild interstitial edema. The bones are stable. IMPRESSION: Mild congestive heart failure. Patchy consolidation of bilateral lung bases with bilateral pleural effusions, underlying pneumonia is not excluded. Electronically Signed   By: Abelardo Diesel M.D.   On: 11/23/2015 11:40   Ct Head Wo Contrast  Result Date: 11/23/2015 CLINICAL DATA:  82 year old female with a history of fall. Right hip pain EXAM: CT HEAD WITHOUT CONTRAST CT CERVICAL SPINE WITHOUT CONTRAST TECHNIQUE: Multidetector CT imaging of the head and cervical spine was performed following the standard protocol without intravenous contrast. Multiplanar CT image reconstructions of the cervical spine were also generated. COMPARISON:  MR brain 03/18/2015, head CT 03/16/2015 FINDINGS: CT HEAD FINDINGS Unremarkable appearance of the calvarium  without acute fracture or aggressive lesion. Unremarkable appearance of the scalp soft tissues. Unremarkable appearance of the bilateral orbits. Mastoid air cells are clear. No significant paranasal sinus disease Small calcified meningioma along the inner table of the left temporal bone, unchanged. No local mass effect. No acute intracranial hemorrhage, midline shift, or mass effect. Unchanged configuration of the ventricles. Gray-white differentiation maintained. Confluent hypodensity in the periventricular white matter, most pronounced in the posterior right parietal and occipital lobe. Hyperdense cortex of the right occipital region unchanged from the comparison. No local mass effect. Dense intracranial atherosclerosis. CT CERVICAL SPINE FINDINGS Craniocervical junction aligned. No acute fracture at the skullbase identified. Anatomic alignment of the cervical elements is relatively maintained. No subluxation. Vertebral body heights maintained. No fracture line identified. Alignment of the facets maintain. Multilevel degenerative changes throughout the cervical spine with disc space narrowing, uncovertebral joint disease,  and posterior disc osteophyte complexes. No significant bony canal narrowing. Uncovertebral joint disease and facet disease contributes to varying degrees of bilateral neural foraminal narrowing. No evidence of epidural hemorrhage. Right thyroid cyst. Irregular parenchyma of the bilateral thyroid. Calcifications of the carotid vasculature. Bilateral pleural effusions layer dependently at the lung apices. IMPRESSION: Head CT: No CT evidence of acute intracranial abnormality. Similar changes of chronic white matter disease and mineralization of cortex in the right parieto-occipital region. Intracranial atherosclerosis. Cervical spine CT: No CT evidence of acute fracture or malalignment of the cervical spine. Multilevel degenerative disc disease, uncovertebral joint disease, as well as facet changes  contribute to varying degrees of bilateral neural foraminal narrowing of the cervical spine. Bilateral pleural effusions layered at the lung apices. Nodular and cystic changes of the thyroid. If clinically warranted, further evaluation with ultrasound may be considered. Signed, Dulcy Fanny. Earleen Newport, DO [facility] Radiology Electronically Signed   By: Corrie Mckusick D.O.   On: 11/23/2015 12:13   Ct Cervical Spine Wo Contrast  Result Date: 11/23/2015 CLINICAL DATA:  82 year old female with a history of fall. Right hip pain EXAM: CT HEAD WITHOUT CONTRAST CT CERVICAL SPINE WITHOUT CONTRAST TECHNIQUE: Multidetector CT imaging of the head and cervical spine was performed following the standard protocol without intravenous contrast. Multiplanar CT image reconstructions of the cervical spine were also generated. COMPARISON:  MR brain 03/18/2015, head CT 03/16/2015 FINDINGS: CT HEAD FINDINGS Unremarkable appearance of the calvarium without acute fracture or aggressive lesion. Unremarkable appearance of the scalp soft tissues. Unremarkable appearance of the bilateral orbits. Mastoid air cells are clear. No significant paranasal sinus disease Small calcified meningioma along the inner table of the left temporal bone, unchanged. No local mass effect. No acute intracranial hemorrhage, midline shift, or mass effect. Unchanged configuration of the ventricles. Gray-white differentiation maintained. Confluent hypodensity in the periventricular white matter, most pronounced in the posterior right parietal and occipital lobe. Hyperdense cortex of the right occipital region unchanged from the comparison. No local mass effect. Dense intracranial atherosclerosis. CT CERVICAL SPINE FINDINGS Craniocervical junction aligned. No acute fracture at the skullbase identified. Anatomic alignment of the cervical elements is relatively maintained. No subluxation. Vertebral body heights maintained. No  fracture line identified. Alignment of the facets maintain. Multilevel degenerative changes throughout the cervical spine with disc space narrowing, uncovertebral joint disease, and posterior disc osteophyte complexes. No significant bony canal narrowing. Uncovertebral joint disease and facet disease contributes to varying degrees of bilateral neural foraminal narrowing. No evidence of epidural hemorrhage. Right thyroid cyst. Irregular parenchyma of the bilateral thyroid. Calcifications of the carotid vasculature. Bilateral pleural effusions layer dependently at the lung apices. IMPRESSION: Head CT: No CT evidence of acute intracranial abnormality. Similar changes of chronic white matter disease and mineralization of cortex in the right parieto-occipital region. Intracranial atherosclerosis. Cervical spine CT: No CT evidence of acute fracture or malalignment of the cervical spine. Multilevel degenerative disc disease, uncovertebral joint disease, as well as facet changes contribute to varying degrees of bilateral neural foraminal narrowing of the cervical spine. Bilateral pleural effusions layered at the lung apices. Nodular and cystic changes of the thyroid. If clinically warranted, further evaluation with ultrasound may be considered. Signed, Dulcy Fanny. Earleen Newport, DO [facility] Radiology Electronically Signed   By: Corrie Mckusick D.O.   On: 11/23/2015 12:13   Dg Hip Unilat  With Pelvis 2-3 Views Right  Result Date: 11/23/2015 CLINICAL DATA:  Status post fall today with  right hip pain EXAM: DG HIP (WITH OR WITHOUT PELVIS) 2-3V RIGHT COMPARISON:  None. FINDINGS: There is displaced fracture at the base of the right femoral neck. There is no dislocation. IMPRESSION: Fracture at the base of the right femoral neck. Electronically Signed   By: Abelardo Diesel M.D.   On: 11/23/2015 11:39        Scheduled Meds: . atorvastatin  80 mg Oral Daily  . cefTRIAXone (ROCEPHIN)   IV  1 g Intravenous Q24H  . enoxaparin (LOVENOX) injection  50 mg Subcutaneous BID  . furosemide  20 mg Oral Daily  . gabapentin  100 mg Oral BID  . insulin aspart  0-9 Units Subcutaneous TID WC  . losartan  50 mg Oral Daily  . metoprolol  50 mg Oral BID  . pantoprazole  40 mg Oral BID  . senna  1 tablet Oral BID  . trimethoprim  100 mg Oral QHS   Continuous Infusions:    LOS: 1 day    Time spent: > 35 minutes  Velvet Bathe, MD [facility] Pager [phone]  If 7PM-7AM, please contact night-coverage [email or web address] Password [email or web address] 11/24/2015, 4:27 PM

## 2015-11-24 NOTE — Progress Notes (Signed)
Okay to hold Lovenox tonight per NP Baltazar Najjar. Pt has possible surgery tomorrow. Sequential compression device placed on left lower extremity. Will continue to monitor.

## 2015-11-24 NOTE — Progress Notes (Signed)
Subjective: Patient was admitted yesterday from [facility] after a fall. In room now with her POA family member. Unable to communicate clearly. POA reports that the patient has been confused since her endoscopy a couple weeks ago. Reports that the patient had many falls while at [facility].  Objective: Vital signs in last 24 hours: Temp:  [97.4 F (36.3 C)-100.4 F (38 C)] 97.4 F (36.3 C) (09/04 0645) Pulse Rate:  [89-145] 102 (09/04 0645) Resp:  [16-32] 18 (09/04 0645) BP: (110-165)/(56-103) 118/73 (09/04 0645) SpO2:  [93 %-98 %] 95 % (09/04 0645) Weight:  [54.4 kg (120 lb)] 54.4 kg (120 lb) (09/03 1032)  Intake/Output from previous day: 09/03 0701 - 09/04 0700 In: 0  Out: 150 [Urine:150]   Recent Labs  11/23/15 1113 11/24/15 0439  HGB 11.7* 11.7*    Recent Labs  11/23/15 1113 11/24/15 0439  WBC 15.4* 17.1*  RBC 4.15 4.24  HCT 35.9* 35.8*  PLT 381 355    Recent Labs  11/23/15 1113 11/24/15 0439  NA 136 141  K 4.4 4.4  CL 101 105  CO2 25 26  BUN 15 17  CREATININE 0.84 1.01*  GLUCOSE 185* 175*  CALCIUM 8.5* 8.9   Patient confused Right hip externally rotated Intact pulses distally Dorsiflexion/Plantar flexion intact Compartment soft  Assessment/Plan: Right femoral neck fracture Patient has already eaten and drank this morning. Will continue to allow her to do so. Possible surgery tomorrow. Patient Eliquis held. Needs hemiarthroplasty. Bed rest today. Foley catheter in place.    Emmett Bracknell LAUREN 11/24/2015, 7:04 AM

## 2015-11-24 NOTE — Evaluation (Signed)
SLP Cancellation Note  Patient Details Name: Caroline Aguirre MRN: [id number] DOB: 1924/09/26   Cancelled treatment:       Reason Eval/Treat Not Completed: Other (comment) (pt currently lethargic and not alert enough for po, [employer] present and reports tolerance of minimal intake pt will accept, no coughing; will follow up for BSE -(note pt for possible surgery tomorrow))   Luanna Salk, [facility] SLP [phone]

## 2015-11-25 ENCOUNTER — Encounter (HOSPITAL_COMMUNITY)
Admission: EM | Disposition: A | Discharge status: Skilled Nursing Facility | Payer: Self-pay | Source: Home / Self Care | Attending: Internal Medicine

## 2015-11-25 ENCOUNTER — Encounter (HOSPITAL_COMMUNITY): Payer: Self-pay | Admitting: Anesthesiology

## 2015-11-25 DIAGNOSIS — I4891 Unspecified atrial fibrillation: Secondary | ICD-10-CM

## 2015-11-25 LAB — GLUCOSE, CAPILLARY
GLUCOSE-CAPILLARY: 130 mg/dL — AB (ref 65–99)
GLUCOSE-CAPILLARY: 189 mg/dL — AB (ref 65–99)
GLUCOSE-CAPILLARY: 129 mg/dL — AB (ref 65–99)
Glucose-Capillary: 126 mg/dL — ABNORMAL HIGH (ref 65–99)

## 2015-11-25 SURGERY — HEMIARTHROPLASTY, HIP, DIRECT ANTERIOR APPROACH, FOR FRACTURE
Anesthesia: Choice | Laterality: Right

## 2015-11-25 MED ORDER — ENOXAPARIN SODIUM 60 MG/0.6ML ~~LOC~~ SOLN: 50.0000 mg | Freq: Two times a day (BID) | SUBCUTANEOUS | Status: DC

## 2015-11-25 MED ORDER — METOPROLOL TARTRATE 50 MG PO TABS
75.0000 mg | ORAL_TABLET | Freq: Two times a day (BID) | ORAL | Status: DC
  Administered 2015-11-25 – 2015-11-28 (×5): 75 mg via ORAL
  Filled 2015-11-25 (×6): qty 1

## 2015-11-25 MED ORDER — ENSURE ENLIVE PO LIQD
237.0000 mL | Freq: Two times a day (BID) | ORAL | Status: DC
  Administered 2015-11-27 – 2015-11-28 (×2): 237 mL via ORAL

## 2015-11-25 NOTE — Progress Notes (Addendum)
Subjective: R hip femoral neck fx  Patient is demented and unable to answer my questions this morning. Lying in bed. Last received Eliquis Sunday. Received Lovenox last night, none this morning for possible surgery. Pt is NPO. Her heart rate has been intermittently high overnight. Spoke with her nurse this AM, Amy, who plans to obtain an EKG and follow up with Dr. Vega.  Objective: Vital signs in last 24 hours: Temp:  [97.3 F (36.3 C)-99.7 F (37.6 C)] 99.7 F (37.6 C) (09/05 0345) Pulse Rate:  [107-116] 107 (09/05 0351) Resp:  [16] 16 (09/05 0345) BP: (127-144)/(55-96) 127/88 (09/05 0345) SpO2:  [89 %-96 %] 89 % (09/05 0345)  Intake/Output from previous day:  Intake/Output Summary (Last 24 hours) at 11/25/15 0856 Last data filed at 11/25/15 0600  Gross per 24 hour  Intake              150 ml  Output              60 0 ml  Net             -450 ml    Intake/Output this shift: No intake/output data recorded.  Labs: Results for orders placed or performed during the hospital encounter of 11/23/15  Culture, Urine  Result Value Ref Range   Specimen Description URINE, RANDOM    Special Requests NONE    Culture      CULTURE REINCUBATED FOR BETTER GROWTH Performed at [facility]    Report Status PENDING   Surgical PCR screen  Result Value Ref Range   MRSA, PCR NEGATIVE NEGATIVE   Staphylococcus aureus POSITIVE (A) NEGATIVE  CBC with Differential/Platelet  Result Value Ref Range   WBC 15.4 (H) 4.0 - 10.5 K/uL   RBC 4.15 3.87 - 5.11 MIL/uL   Hemoglobin 11.7 (L) 12.0 - 15.0 g/dL   HCT 35.9 (L) 36.0 - 46.0 %   MCV 86.5 78.0 - 100.0 fL   MCH 28.2 26.0 - 34.0 pg   MCHC 32.6 30.0 - 36.0 g/dL   RDW 14.1 11.5 - 15.5 %   Platelets 381 150 - 400 K/uL   Neutrophils Relative % 84 %   Neutro Abs 13.1 (H) 1.7 - 7.7 K/uL   Lymphocytes Relative 8 %   Lymphs Abs 1.2 0.7 - 4.0 K/uL   Monocytes Relative 8 %   Monocytes Absolute 1.2 (H) 0.1 - 1.0 K/uL   Eosinophils Relative 0 %    Eosinophils Absolute 0.0 0.0 - 0.7 K/uL   Basophils Relative 0 %   Basophils Absolute 0.0 0.0 - 0.1 K/uL  Comprehensive metabolic panel  Result Value Ref Range   Sodium 136 135 - 145 mmol/L   Potassium 4.4 3.5 - 5.1 mmol/L   Chloride 101 101 - 111 mmol/L   CO2 25 22 - 32 mmol/L   Glucose, Bld 185 (H) 65 - 99 mg/dL   BUN 15 6 - 20 mg/dL   Creatinine, Ser 0.84 0.44 - 1.00 mg/dL   Calcium 8.5 (L) 8.9 - 10.3 mg/dL   Total Protein 5.9 (L) 6.5 - 8.1 g/dL   Albumin 3.2 (L) 3.5 - 5.0 g/dL   AST 41 15 - 41 U/L   ALT 56 (H) 14 - 54 U/L   Alkaline Phosphatase 95 38 - 126 U/L   Total Bilirubin 1.2 0.3 - 1.2 mg/dL   GFR calc non Af Amer 60 (L) >60 mL/min   GFR calc Af Amer >60 >60 mL/min   Anion gap  10 5 - 15  Urinalysis, Routine w reflex microscopic (not at [facility])  Result Value Ref Range   Color, Urine YELLOW YELLOW   APPearance HAZY (A) CLEAR   Specific Gravity, Urine 1.010 1.005 - 1.030   pH 6.5 5.0 - 8.0   Glucose, UA NEGATIVE NEGATIVE mg/dL   Hgb urine dipstick TRACE (A) NEGATIVE   Bilirubin Urine NEGATIVE NEGATIVE   Ketones, ur NEGATIVE NEGATIVE mg/dL   Protein, ur NEGATIVE NEGATIVE mg/dL   Nitrite POSITIVE (A) NEGATIVE   Leukocytes, UA LARGE (A) NEGATIVE  Urine microscopic-add on  Result Value Ref Range   Squamous Epithelial / LPF 0-5 (A) NONE SEEN   WBC, UA TOO NUMEROUS TO COUNT 0 - 5 WBC/hpf   RBC / HPF 0-5 0 - 5 RBC/hpf   Bacteria, UA MANY (A) NONE SEEN  CBC  Result Value Ref Range   WBC 17.1 (H) 4.0 - 10.5 K/uL   RBC 4.24 3.87 - 5.11 MIL/uL   Hemoglobin 11.7 (L) 12.0 - 15.0 g/dL   HCT 35.8 (L) 36.0 - 46.0 %   MCV 84.4 78.0 - 100.0 fL   MCH 27.6 26.0 - 34.0 pg   MCHC 32.7 30.0 - 36.0 g/dL   RDW 14.1 11.5 - 15.5 %   Platelets 355 150 - 400 K/uL  Basic metabolic panel  Result Value Ref Range   Sodium 141 135 - 145 mmol/L   Potassium 4.4 3.5 - 5.1 mmol/L   Chloride 105 101 - 111 mmol/L   CO2 26 22 - 32 mmol/L   Glucose, Bld 175 (H) 65 - 99 mg/dL   BUN 17 6 - 20  mg/dL   Creatinine, Ser 1.01 (H) 0.44 - 1.00 mg/dL   Calcium 8.9 8.9 - 10.3 mg/dL   GFR calc non Af Amer 48 (L) >60 mL/min   GFR calc Af Amer 55 (L) >60 mL/min   Anion gap 10 5 - 15  Glucose, capillary  Result Value Ref Range   Glucose-Capillary 148 (H) 65 - 99 mg/dL  Glucose, capillary  Result Value Ref Range   Glucose-Capillary 146 (H) 65 - 99 mg/dL  Glucose, capillary  Result Value Ref Range   Glucose-Capillary 152 (H) 65 - 99 mg/dL  Glucose, capillary  Result Value Ref Range   Glucose-Capillary 134 (H) 65 - 99 mg/dL  Glucose, capillary  Result Value Ref Range   Glucose-Capillary 129 (H) 65 - 99 mg/dL  Glucose, capillary  Result Value Ref Range   Glucose-Capillary 126 (H) 65 - 99 mg/dL    Exam - Neurologically intact ABD soft Neurovascular intact Sensation intact distally Intact pulses distally Dorsiflexion/Plantar flexion intact No cellulitis present Compartment soft no calf pain or sign of DVT Motor function intact - moving foot and toes well on exam.   Assessment/Plan: Right hip fracture (femoral neck)  Past Medical History:  Diagnosis Date  . Arthritis   . Cancer ([place])    Rt Breast  . Coronary artery disease   . Cyst of breast    Right - at 8 yrs of age.  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 Diabetes mellitus without complication ([place])   . Diverticulitis   . Elevated cholesterol   . GERD (gastroesophageal reflux disease)   . Hardening of the arteries of the brain   . Heart disease   . Herniated disc   . Hx Breast cancer, DCIS, Right 08/19/2010  . Hypertension   .  Migraine headache   . SBO (small bowel obstruction) ([place]) 09/02/11  . Shingles   . Stroke [facility]) 2004   cerebral hemmorhage   EKG this AM due to tachycardia. Amy to update Dr. Wendee Beavers with any changes from EKG in ER. Holding Eliquis and Lovenox No vaccines Possible OR today (Dr. Alvan Dame) depending on timing of his already scheduled cases. Otherwise  discussed with Dr. Lyla Glassing for OR tomorrow. Keep NPO for possible surgery today (hemiarthroplasty) Discussed with Dr. Tonita Cong and Dr. Lyla Glassing this AM  Lacie Draft M. 11/25/2015, 8:56 AM  Updated - EKG with A Fib with RVR Dr. Wendee Beavers aware and pt to be transferred to 4th floor Will need anticoagulation - Dr. Tonita Cong discussed with Amy who will discuss with Dr. Wendee Beavers- can rate be controlled to allow pt to get to OR vs. Will we need to hold off on OR for now and anticoagulate?

## 2015-11-25 NOTE — Evaluation (Signed)
SLP Cancellation Note  Patient Details Name: KINLYNN WIATROWSKI MRN: [id number] DOB: [birth year]   Cancelled treatment:       Reason Eval/Treat Not Completed: Medical issues which prohibited therapy (pt npo for potential surgery today, will reattempt next date)   Luanna Salk, [facility] SLP [phone]

## 2015-11-25 NOTE — Progress Notes (Signed)
Notified NP Baltazar Najjar about pt's heart rate fluctuating between 110s to 130s. Heart rate can increase to 140s when agitated. Pt has history or atrial fibrillation. Ordered to call back if heart rate sustains in the 120s. Continuous pulse ox placed on patient. Will continue to monitor.

## 2015-11-25 NOTE — Progress Notes (Signed)
PROGRESS NOTE    Caroline Aguirre  [id number] DOB: 02-28-1925 DOA: 11/23/2015 PCP: Alonza Bogus, MD   Brief Narrative:  82 year old woman with dementia, coronary artery disease, recent admission for GI bleed, presented to the emergency department after a fall onto her right side at the nursing home. Imaging revealed right hip fracture  Assessment & Plan:   Principal Problem:   Closed right hip fracture ([place]) - ortho on board and assisting.  - Continue supportive therapy  Afib with RVR - resolved once patient was given her oral B blocker. Will go up on her oral B blocker dose - transfer to telemetry - anticoagulated on lovenox. Plan is to hold lovenox 12 hours prior to operation.   UTI - continue Rocephin   Active Problems:   Hearing loss   Acute encephalopathy - most likely secondary to infectious etiology    DM type 2 (diabetes mellitus, type 2) ([place]) - Continue diabetic diet once patient okay to eat by surgeon    Coronary artery disease - Continue statin    Atrial fibrillation (HCC) - Continue metoprolol   DVT prophylaxis: Lovenox Code Status: DNR Family Communication: d/c family member at bedside Disposition Plan: pending recommendations by ortho   Consultants:   Orthopedic surgery   Procedures: Pending   Antimicrobials: Rocephin, trimethoprim   Subjective: Pt has no complaints  Objective: Vitals:   11/25/15 0845 11/25/15 0918 11/25/15 1020 11/25/15 1100  BP:  125/75 119/65 132/87  Pulse: (!) 118 (!) 110 91 (!) 113  Resp:    20  Temp:    98.2 F (36.8 C)  TempSrc:    Oral  SpO2:    98%  Weight:      Height:        Intake/Output Summary (Last 24 hours) at 11/25/15 1416 Last data filed at 11/25/15 1301  Gross per 24 hour  Intake              110 ml  Output              725 ml  Net             -615 ml   Filed Weights   11/23/15 1032  Weight: 54.4 kg (120 lb)    Examination:  General exam: Appears calm and comfortable, In no  acute distress  Respiratory system: Clear to auscultation. Respiratory effort normal. No wheezes Cardiovascular system: S1 & S2 No JVD, murmurs, rubs,  Gastrointestinal system: Abdomen is nondistended, soft and nontender. No organomegaly or masses felt. Normal bowel sounds heard. Central nervous system: Alert difficult to examine secondary to altered mental status Extremities: No contractures Skin: No rashes, lesions or ulcers on limited exam Psychiatry: Unable to assess accurately secondary to altered mental status    Data Reviewed: I have personally reviewed following labs and imaging studies  CBC:  Recent Labs Lab 11/23/15 1113 11/24/15 0439  WBC 15.4* 17.1*  NEUTROABS 13.1*  --   HGB 11.7* 11.7*  HCT 35.9* 35.8*  MCV 86.5 84.4  PLT 381 [id number]   Basic Metabolic Panel:  Recent Labs Lab 11/23/15 1113 11/24/15 0439  NA 136 141  K 4.4 4.4  CL 101 105  CO2 25 26  GLUCOSE 185* 175*  BUN 15 17  CREATININE 0.84 1.01*  CALCIUM 8.5* 8.9   GFR: Estimated Creatinine Clearance: 31.2 mL/min (by C-G formula based on SCr of 1.01 mg/dL). Liver Function Tests:  Recent Labs Lab 11/23/15 1113  AST 41  ALT 56*  ALKPHOS 95  BILITOT 1.2  PROT 5.9*  ALBUMIN 3.2*   No results for input(s): LIPASE, AMYLASE in the last 168 hours. No results for input(s): AMMONIA in the last 168 hours. Coagulation Profile: No results for input(s): INR, PROTIME in the last 168 hours. Cardiac Enzymes: No results for input(s): CKTOTAL, CKMB, CKMBINDEX, TROPONINI in the last 168 hours. BNP (last 3 results) No results for input(s): PROBNP in the last 8760 hours. HbA1C: No results for input(s): HGBA1C in the last 72 hours. CBG:  Recent Labs Lab 11/24/15 1217 11/24/15 1627 11/24/15 2258 11/25/15 0729 11/25/15 1149  GLUCAP 152* 134* 129* 126* 130*   Lipid Profile: No results for input(s): CHOL, HDL, LDLCALC, TRIG, CHOLHDL, LDLDIRECT in the last 72 hours. Thyroid Function Tests: No results  for input(s): TSH, T4TOTAL, FREET4, T3FREE, THYROIDAB in the last 72 hours. Anemia Panel: No results for input(s): VITAMINB12, FOLATE, FERRITIN, TIBC, IRON, RETICCTPCT in the last 72 hours. Sepsis Labs: No results for input(s): PROCALCITON, LATICACIDVEN in the last 168 hours.  Recent Results (from the past 240 hour(s))  Culture, Urine     Status: None (Preliminary result)   Collection Time: 11/23/15  4:02 PM  Result Value Ref Range Status   Specimen Description URINE, RANDOM  Final   Special Requests NONE  Final   Culture   Final    CULTURE REINCUBATED FOR BETTER GROWTH Performed at [facility]    Report Status PENDING  Incomplete  Surgical PCR screen     Status: Abnormal   Collection Time: 11/24/15  8:40 AM  Result Value Ref Range Status   MRSA, PCR NEGATIVE NEGATIVE Final   Staphylococcus aureus POSITIVE (A) NEGATIVE Final    Comment:        The Xpert SA Assay (FDA approved for NASAL specimens in patients over 17 years of age), is one component of a comprehensive surveillance program.  Test performance has been validated by [facility] for patients greater than or equal to 76 year old. It is not intended to diagnose infection nor to guide or monitor treatment.          Radiology Studies: No results found.      Scheduled Meds: . atorvastatin  80 mg Oral Daily  . cefTRIAXone (ROCEPHIN)  IV  1 g Intravenous Q24H  . [START ON 11/26/2015] enoxaparin (LOVENOX) injection  50 mg Subcutaneous BID  . feeding supplement (ENSURE ENLIVE)  237 mL Oral BID BM  . furosemide  20 mg Oral Daily  . gabapentin  100 mg Oral BID  . insulin aspart  0-9 Units Subcutaneous TID WC  . losartan  50 mg Oral Daily  . metoprolol  75 mg Oral BID  . pantoprazole  40 mg Oral BID  . senna  1 tablet Oral BID  . trimethoprim  100 mg Oral QHS   Continuous Infusions:    LOS: 2 days    Time spent: > 35 minutes  Velvet Bathe, MD [facility] Pager [phone]  If  7PM-7AM, please contact night-coverage [email or web address] Password [email or web address] 11/25/2015, 2:16 PM

## 2015-11-25 NOTE — Clinical Social Work Note (Signed)
Clinical Social Work Assessment  Patient Details  Name: Caroline Aguirre MRN: [id number] Date of Birth: 05-Aug-1924  Date of referral:  11/25/15               Reason for consult:  Facility Placement, Discharge Planning                Permission sought to share information with:  Facility Art therapist granted to share information::  Yes, Verbal Permission Granted  Name::        Agency::     Relationship::     Contact Information:     Housing/Transportation Living arrangements for the past 2 months:  [place] of Information:  Other (Comment Required) (Cousin / POA Caroline Aguirre) Patient Interpreter Needed:  None Criminal Activity/Legal Involvement Pertinent to Current Situation/Hospitalization:  No - Comment as needed Significant Relationships:  Adult Children, Other Family Members Lives with:  Facility Resident Do you feel safe going back to the place where you live?  Yes Need for family participation in patient care:  Yes (Comment)  Care giving concerns: POA did not report any concerns at this time.   Social Worker assessment / plan:  Pt hospitalized on 11/23/15 from [place] with a right hip femoral fx. Surgery is pending. Pt has dementia and is unable to participate In d/c planning. CSW met with pt's cousin / Caroline Aguirre, [phone] to assist with d/c planning on 11/25/15. POA would like pt to return to [facility] when stable for d/c. Clinicals have been sent to SNF. Confirmation of d/c plan is pending. CSW will continue to follow to assist with d/c planning needs.  Employment status:  Retired Forensic scientist:  Medicare PT Recommendations:  [place] / Referral to community resources:  [place]  Patient/Family's Response to care:  POA agrees continued rehab will be needed at d/c.  Patient/Family's Understanding of and Emotional Response to Diagnosis, Current Treatment, and Prognosis:  POA  is aware of pt's medical status and pending surgery. " My cousin has had several falls recently. She is fortunate that this is the only fall where she has hurt herself. "  Emotional Assessment Appearance:  Appears stated age Attitude/Demeanor/Rapport:  Unable to Assess Affect (typically observed):  Unable to Assess Orientation:   (disoriented x4) Alcohol / Substance use:  Not Applicable Psych involvement (Current and /or in the community):     Discharge Needs  Concerns to be addressed:  Discharge Planning Concerns Readmission within the last 30 days:  Yes Current discharge risk:  None Barriers to Discharge:  No Barriers Identified   Caroline Aguirre  [phone] 11/25/2015, 12:17 PM

## 2015-11-25 NOTE — Progress Notes (Signed)
Patient heart rate above 120 at times. Dierdre Highman PA notified. Order for EKG given. Patient results afib with RVR. Dr. Wendee Beavers notified. New orders for telemetry given.

## 2015-11-25 NOTE — Progress Notes (Signed)
Initial Nutrition Assessment  DOCUMENTATION CODES:   Not applicable  INTERVENTION:  - Will order Ensure Enlive po BID, each supplement provides 350 kcal and 20 grams of protein - Tech/RN to assist with meals and to encourage PO intakes. - RD will continue to monitor for additional nutrition-related needs.  NUTRITION DIAGNOSIS:   Inadequate oral intake related to inability to eat as evidenced by NPO status.  GOAL:   Patient will meet greater than or equal to 90% of their needs  MONITOR:   PO intake, Weight trends, Labs, I & O's  REASON FOR ASSESSMENT:   Malnutrition Screening Tool  ASSESSMENT:   82 year old woman with dementia, coronary artery disease, recent admission for GI bleed, presented to the emergency department after a fall onto her right side at the nursing home. Imaging revealed right hip fracture. Family requested transfer to [facility] for orthopedic care. Orthopedics will see in consultation, requested transfer to [facility].  Pt seen for MST. BMI indicates normal weight. Pt was NPO from admission until 1020 today and lunch tray was delivered a few minutes ago; will alert staff as pt appears unable to feed herself. Pt mumbling incoherently during RD visit and no family/visitors present at this time.   Unable to perform physical assessment d/y pt unable to give consent and concern for startling her given demented state. Some muscle wasting apparent to upper body but unable to specify degree of wasting. CBW consistent with weight from June 2017. History also shows 5 lb weight loss (4% body weight) in the past 1 year which is not significant for time frame.   Will order Ensure Enlive BID and continue to monitor for additional needs. Will monitor for POC concerning R hip fx s/p fall.   Medications reviewed; 20 mg oral Lasix BID, sliding scale Novolog, PRN IV Zofran, 40 mg Protonix BID, PRN Miralax, 1 tablet Senokot BID. Labs reviewed; CBGs: 126 and 130 mg/dL,  creatinine: 1.01 mg/dL, GFR: 48 mL/min.   Diet Order:  Diet Heart Room service appropriate? Yes; Fluid consistency: Thin  Skin:  Reviewed, no issues  Last BM:  PTA  Height:   Ht Readings from Last 1 Encounters:  11/23/15 5\' 3"  (1.6 m)    Weight:   Wt Readings from Last 1 Encounters:  11/23/15 120 lb (54.4 kg)    Ideal Body Weight:  52.27 kg  BMI:  Body mass index is 21.26 kg/m.  Estimated Nutritional Needs:   Kcal:  1100-1300  Protein:  55-65 grams  Fluid:  >/= 1.5 L/day  EDUCATION NEEDS:   No education needs identified at this time    Jarome Matin, MS, RD, LDN Inpatient Clinical Dietitian Pager # [phone] After hours/weekend pager # [phone]

## 2015-11-26 ENCOUNTER — Inpatient Hospital Stay (HOSPITAL_COMMUNITY): Payer: Medicare Other

## 2015-11-26 ENCOUNTER — Encounter (HOSPITAL_COMMUNITY)
Admission: EM | Disposition: A | Discharge status: Skilled Nursing Facility | Payer: Self-pay | Source: Home / Self Care | Attending: Internal Medicine

## 2015-11-26 ENCOUNTER — Inpatient Hospital Stay (HOSPITAL_COMMUNITY): Payer: Medicare Other | Admitting: Anesthesiology

## 2015-11-26 DIAGNOSIS — G9340 Encephalopathy, unspecified: Secondary | ICD-10-CM

## 2015-11-26 DIAGNOSIS — I2510 Atherosclerotic heart disease of native coronary artery without angina pectoris: Secondary | ICD-10-CM

## 2015-11-26 DIAGNOSIS — S72001A Fracture of unspecified part of neck of right femur, initial encounter for closed fracture: Principal | ICD-10-CM | POA: Diagnosis present

## 2015-11-26 DIAGNOSIS — E1169 Type 2 diabetes mellitus with other specified complication: Secondary | ICD-10-CM

## 2015-11-26 DIAGNOSIS — F0390 Unspecified dementia without behavioral disturbance: Secondary | ICD-10-CM

## 2015-11-26 DIAGNOSIS — I482 Chronic atrial fibrillation: Secondary | ICD-10-CM

## 2015-11-26 HISTORY — PX: TOTAL HIP ARTHROPLASTY: SHX124

## 2015-11-26 LAB — GLUCOSE, CAPILLARY
GLUCOSE-CAPILLARY: 122 mg/dL — AB (ref 65–99)
GLUCOSE-CAPILLARY: 111 mg/dL — AB (ref 65–99)
GLUCOSE-CAPILLARY: 111 mg/dL — AB (ref 65–99)
Glucose-Capillary: 154 mg/dL — ABNORMAL HIGH (ref 65–99)

## 2015-11-26 LAB — URINE CULTURE

## 2015-11-26 LAB — ABO/RH: ABO/RH(D): A NEG

## 2015-11-26 LAB — TYPE AND SCREEN
ABO/RH(D): A NEG
ANTIBODY SCREEN: NEGATIVE

## 2015-11-26 SURGERY — ARTHROPLASTY, HIP, TOTAL, ANTERIOR APPROACH
Anesthesia: General | Site: Hip | Laterality: Right

## 2015-11-26 MED ORDER — 0.9 % SODIUM CHLORIDE (POUR BTL) OPTIME
TOPICAL | Status: DC | PRN
  Administered 2015-11-26: 1000 mL

## 2015-11-26 MED ORDER — OXYCODONE HCL 5 MG/5ML PO SOLN
5.0000 mg | Freq: Once | ORAL | Status: DC | PRN
  Filled 2015-11-26: qty 5

## 2015-11-26 MED ORDER — PROPOFOL 10 MG/ML IV BOLUS
INTRAVENOUS | Status: DC | PRN
  Administered 2015-11-26: 100 mg via INTRAVENOUS

## 2015-11-26 MED ORDER — ONDANSETRON HCL 4 MG PO TABS: 4.0000 mg | ORAL_TABLET | Freq: Four times a day (QID) | ORAL | Status: DC | PRN

## 2015-11-26 MED ORDER — CEFAZOLIN SODIUM-DEXTROSE 2-4 GM/100ML-% IV SOLN
INTRAVENOUS | Status: AC
  Filled 2015-11-26: qty 100

## 2015-11-26 MED ORDER — SUCCINYLCHOLINE CHLORIDE 200 MG/10ML IV SOSY
PREFILLED_SYRINGE | INTRAVENOUS | Status: DC | PRN
  Administered 2015-11-26: 100 mg via INTRAVENOUS

## 2015-11-26 MED ORDER — LIDOCAINE 2% (20 MG/ML) 5 ML SYRINGE
INTRAMUSCULAR | Status: DC | PRN
  Administered 2015-11-26: 50 mg via INTRAVENOUS

## 2015-11-26 MED ORDER — ROCURONIUM BROMIDE 10 MG/ML (PF) SYRINGE
PREFILLED_SYRINGE | INTRAVENOUS | Status: DC | PRN
  Administered 2015-11-26: 20 mg via INTRAVENOUS

## 2015-11-26 MED ORDER — HYDROGEN PEROXIDE 3 % EX SOLN
CUTANEOUS | Status: AC
  Filled 2015-11-26: qty 473

## 2015-11-26 MED ORDER — LACTATED RINGERS IV SOLN
INTRAVENOUS | Status: DC | PRN
  Administered 2015-11-26 (×2): via INTRAVENOUS

## 2015-11-26 MED ORDER — KETOROLAC TROMETHAMINE 30 MG/ML IJ SOLN
INTRAMUSCULAR | Status: DC | PRN
  Administered 2015-11-26: 30 mg via INTRAMUSCULAR

## 2015-11-26 MED ORDER — SODIUM CHLORIDE 0.9 % IR SOLN
Status: DC | PRN
  Administered 2015-11-26: 3000 mL

## 2015-11-26 MED ORDER — PHENOL 1.4 % MT LIQD: 1.0000 | OROMUCOSAL | Status: DC | PRN

## 2015-11-26 MED ORDER — PHENYLEPHRINE 40 MCG/ML (10ML) SYRINGE FOR IV PUSH (FOR BLOOD PRESSURE SUPPORT)
PREFILLED_SYRINGE | INTRAVENOUS | Status: DC | PRN
  Administered 2015-11-26 (×3): 120 ug via INTRAVENOUS

## 2015-11-26 MED ORDER — TRANEXAMIC ACID 1000 MG/10ML IV SOLN
1000.0000 mg | INTRAVENOUS | Status: AC
  Administered 2015-11-26: 1000 mg via INTRAVENOUS
  Filled 2015-11-26: qty 10

## 2015-11-26 MED ORDER — PHENYLEPHRINE 40 MCG/ML (10ML) SYRINGE FOR IV PUSH (FOR BLOOD PRESSURE SUPPORT)
PREFILLED_SYRINGE | INTRAVENOUS | Status: AC
  Filled 2015-11-26: qty 10

## 2015-11-26 MED ORDER — ACETAMINOPHEN 10 MG/ML IV SOLN
INTRAVENOUS | Status: AC
  Filled 2015-11-26: qty 100

## 2015-11-26 MED ORDER — ROCURONIUM BROMIDE 10 MG/ML (PF) SYRINGE
PREFILLED_SYRINGE | INTRAVENOUS | Status: AC
  Filled 2015-11-26: qty 10

## 2015-11-26 MED ORDER — ONDANSETRON HCL 4 MG/2ML IJ SOLN
INTRAMUSCULAR | Status: AC
  Filled 2015-11-26: qty 2

## 2015-11-26 MED ORDER — CEFAZOLIN SODIUM-DEXTROSE 2-4 GM/100ML-% IV SOLN
2.0000 g | INTRAVENOUS | Status: AC
  Administered 2015-11-26: 2 g via INTRAVENOUS

## 2015-11-26 MED ORDER — ONDANSETRON HCL 4 MG/2ML IJ SOLN: 4.0000 mg | Freq: Four times a day (QID) | INTRAMUSCULAR | Status: DC | PRN

## 2015-11-26 MED ORDER — BUPIVACAINE-EPINEPHRINE 0.25% -1:200000 IJ SOLN
INTRAMUSCULAR | Status: DC | PRN
  Administered 2015-11-26: 30 mL

## 2015-11-26 MED ORDER — DEXAMETHASONE SODIUM PHOSPHATE 10 MG/ML IJ SOLN
INTRAMUSCULAR | Status: AC
  Filled 2015-11-26: qty 1

## 2015-11-26 MED ORDER — KETOROLAC TROMETHAMINE 30 MG/ML IJ SOLN
INTRAMUSCULAR | Status: AC
  Filled 2015-11-26: qty 1

## 2015-11-26 MED ORDER — APIXABAN 2.5 MG PO TABS
2.5000 mg | ORAL_TABLET | Freq: Two times a day (BID) | ORAL | Status: DC
  Administered 2015-11-27 – 2015-11-28 (×3): 2.5 mg via ORAL
  Filled 2015-11-26 (×3): qty 1

## 2015-11-26 MED ORDER — BUPIVACAINE-EPINEPHRINE (PF) 0.25% -1:200000 IJ SOLN
INTRAMUSCULAR | Status: AC
  Filled 2015-11-26: qty 30

## 2015-11-26 MED ORDER — PHENYLEPHRINE HCL 10 MG/ML IJ SOLN
INTRAMUSCULAR | Status: AC
  Filled 2015-11-26: qty 1

## 2015-11-26 MED ORDER — METOCLOPRAMIDE HCL 5 MG/ML IJ SOLN: 5.0000 mg | Freq: Three times a day (TID) | INTRAMUSCULAR | Status: DC | PRN

## 2015-11-26 MED ORDER — OXYCODONE HCL 5 MG PO TABS: 5.0000 mg | ORAL_TABLET | Freq: Once | ORAL | Status: DC | PRN

## 2015-11-26 MED ORDER — MENTHOL 3 MG MT LOZG: 1.0000 | LOZENGE | OROMUCOSAL | Status: DC | PRN

## 2015-11-26 MED ORDER — SUGAMMADEX SODIUM 200 MG/2ML IV SOLN
INTRAVENOUS | Status: DC | PRN
  Administered 2015-11-26: 200 mg via INTRAVENOUS

## 2015-11-26 MED ORDER — SODIUM CHLORIDE 0.9 % IJ SOLN
INTRAMUSCULAR | Status: DC | PRN
  Administered 2015-11-26: 30 mL

## 2015-11-26 MED ORDER — FENTANYL CITRATE (PF) 100 MCG/2ML IJ SOLN
INTRAMUSCULAR | Status: DC | PRN
  Administered 2015-11-26 (×2): 25 ug via INTRAVENOUS

## 2015-11-26 MED ORDER — ISOPROPYL ALCOHOL 70 % SOLN
Status: DC | PRN
  Administered 2015-11-26: 1 via TOPICAL

## 2015-11-26 MED ORDER — ENOXAPARIN SODIUM 60 MG/0.6ML ~~LOC~~ SOLN: 50.0000 mg | Freq: Two times a day (BID) | SUBCUTANEOUS | Status: DC

## 2015-11-26 MED ORDER — WATER FOR IRRIGATION, STERILE IR SOLN
Status: DC | PRN
  Administered 2015-11-26: 2000 mL via SURGICAL_CAVITY

## 2015-11-26 MED ORDER — ONDANSETRON HCL 4 MG/2ML IJ SOLN: 4.0000 mg | Freq: Once | INTRAMUSCULAR | Status: DC | PRN

## 2015-11-26 MED ORDER — PHENYLEPHRINE HCL 10 MG/ML IJ SOLN
INTRAMUSCULAR | Status: DC | PRN
  Administered 2015-11-26: 10 ug/min via INTRAVENOUS

## 2015-11-26 MED ORDER — ACETAMINOPHEN 650 MG RE SUPP: 650.0000 mg | Freq: Four times a day (QID) | RECTAL | Status: DC | PRN

## 2015-11-26 MED ORDER — ISOPROPYL ALCOHOL 70 % SOLN
Status: AC
  Filled 2015-11-26: qty 480

## 2015-11-26 MED ORDER — LIDOCAINE 2% (20 MG/ML) 5 ML SYRINGE
INTRAMUSCULAR | Status: AC
  Filled 2015-11-26: qty 5

## 2015-11-26 MED ORDER — ACETAMINOPHEN 325 MG PO TABS: 650.0000 mg | ORAL_TABLET | Freq: Four times a day (QID) | ORAL | Status: DC | PRN

## 2015-11-26 MED ORDER — MEPERIDINE HCL 50 MG/ML IJ SOLN: 6.2500 mg | INTRAMUSCULAR | Status: DC | PRN

## 2015-11-26 MED ORDER — ONDANSETRON HCL 4 MG/2ML IJ SOLN
INTRAMUSCULAR | Status: DC | PRN
  Administered 2015-11-26: 4 mg via INTRAVENOUS

## 2015-11-26 MED ORDER — SODIUM CHLORIDE 0.9 % IJ SOLN
INTRAMUSCULAR | Status: AC
  Filled 2015-11-26: qty 50

## 2015-11-26 MED ORDER — HYDROMORPHONE HCL 1 MG/ML IJ SOLN: 0.2500 mg | INTRAMUSCULAR | Status: DC | PRN

## 2015-11-26 MED ORDER — ACETAMINOPHEN 10 MG/ML IV SOLN
1000.0000 mg | Freq: Once | INTRAVENOUS | Status: AC
  Administered 2015-11-26: 1000 mg via INTRAVENOUS

## 2015-11-26 MED ORDER — CEFAZOLIN SODIUM-DEXTROSE 2-4 GM/100ML-% IV SOLN
2.0000 g | Freq: Four times a day (QID) | INTRAVENOUS | Status: AC
Start: 2015-11-26 — End: 2015-11-27
  Administered 2015-11-27 (×2): 2 g via INTRAVENOUS
  Filled 2015-11-26 (×2): qty 100

## 2015-11-26 MED ORDER — SUGAMMADEX SODIUM 200 MG/2ML IV SOLN
INTRAVENOUS | Status: AC
  Filled 2015-11-26: qty 2

## 2015-11-26 MED ORDER — LACTATED RINGERS IV SOLN: INTRAVENOUS | Status: DC

## 2015-11-26 MED ORDER — METOCLOPRAMIDE HCL 5 MG PO TABS: 5.0000 mg | ORAL_TABLET | Freq: Three times a day (TID) | ORAL | Status: DC | PRN

## 2015-11-26 MED ORDER — FENTANYL CITRATE (PF) 100 MCG/2ML IJ SOLN
INTRAMUSCULAR | Status: AC
  Filled 2015-11-26: qty 2

## 2015-11-26 MED ORDER — DEXAMETHASONE SODIUM PHOSPHATE 10 MG/ML IJ SOLN
INTRAMUSCULAR | Status: DC | PRN
  Administered 2015-11-26: 10 mg via INTRAVENOUS

## 2015-11-26 SURGICAL SUPPLY — 46 items
BAG DECANTER FOR FLEXI CONT (MISCELLANEOUS) IMPLANT
BAG SPEC THK2 15X12 ZIP CLS (MISCELLANEOUS) ×2
BAG ZIPLOCK 12X15 (MISCELLANEOUS) ×4 IMPLANT
CABLE 1.7 (Orthopedic Implant) ×1 IMPLANT
CABLE 1.7MM (Orthopedic Implant) ×1 IMPLANT
CAPT HIP HEMI 2 ×2 IMPLANT
CHLORAPREP W/TINT 26ML (MISCELLANEOUS) ×3 IMPLANT
CLOTH BEACON ORANGE TIMEOUT ST (SAFETY) ×3 IMPLANT
COVER PERINEAL POST (MISCELLANEOUS) ×3 IMPLANT
DECANTER SPIKE VIAL GLASS SM (MISCELLANEOUS) ×3 IMPLANT
DRAPE SHEET LG 3/4 BI-LAMINATE (DRAPES) ×6 IMPLANT
DRAPE STERI IOBAN 125X83 (DRAPES) ×3 IMPLANT
DRAPE U-SHAPE 47X51 STRL (DRAPES) ×6 IMPLANT
DRSG AQUACEL AG ADV 3.5X10 (GAUZE/BANDAGES/DRESSINGS) ×3 IMPLANT
ELECT PENCIL ROCKER SW 15FT (MISCELLANEOUS) ×3 IMPLANT
ELECT REM PT RETURN 15FT ADLT (MISCELLANEOUS) ×3 IMPLANT
GAUZE SPONGE 4X4 12PLY STRL (GAUZE/BANDAGES/DRESSINGS) ×3 IMPLANT
GLOVE BIO SURGEON STRL SZ8.5 (GLOVE) ×6 IMPLANT
GLOVE BIOGEL PI IND STRL 8.5 (GLOVE) ×1 IMPLANT
GLOVE BIOGEL PI INDICATOR 8.5 (GLOVE) ×2
GOWN SPEC L3 XXLG W/TWL (GOWN DISPOSABLE) ×3 IMPLANT
HANDPIECE INTERPULSE COAX TIP (DISPOSABLE) ×3
HOLDER FOLEY CATH W/STRAP (MISCELLANEOUS) ×3 IMPLANT
HOOD PEEL AWAY FLYTE STAYCOOL (MISCELLANEOUS) ×6 IMPLANT
LIQUID BAND (GAUZE/BANDAGES/DRESSINGS) ×6 IMPLANT
MARKER SKIN DUAL TIP RULER LAB (MISCELLANEOUS) ×3 IMPLANT
NDL SPNL 18GX3.5 QUINCKE PK (NEEDLE) ×1 IMPLANT
NEEDLE SPNL 18GX3.5 QUINCKE PK (NEEDLE) ×3 IMPLANT
PACK ANTERIOR HIP CUSTOM (KITS) ×3 IMPLANT
SAW OSC TIP CART 19.5X105X1.3 (SAW) ×3 IMPLANT
SEALER BIPOLAR AQUA 6.0 (INSTRUMENTS) ×3 IMPLANT
SET HNDPC FAN SPRY TIP SCT (DISPOSABLE) ×1 IMPLANT
SOL PREP POV-IOD 4OZ 10% (MISCELLANEOUS) ×3 IMPLANT
SUT ETHIBOND NAB CT1 #1 30IN (SUTURE) ×6 IMPLANT
SUT MNCRL AB 3-0 PS2 18 (SUTURE) ×3 IMPLANT
SUT MON AB 2-0 CT1 36 (SUTURE) ×6 IMPLANT
SUT STRATAFIX PDO 1 14 VIOLET (SUTURE) ×3
SUT STRATFX PDO 1 14 VIOLET (SUTURE) ×1
SUT VIC AB 2-0 CT1 27 (SUTURE) ×3
SUT VIC AB 2-0 CT1 TAPERPNT 27 (SUTURE) ×1 IMPLANT
SUTURE STRATFX PDO 1 14 VIOLET (SUTURE) ×1 IMPLANT
SYR 50ML LL SCALE MARK (SYRINGE) ×3 IMPLANT
TRAY FOLEY W/METER SILVER 14FR (SET/KITS/TRAYS/PACK) IMPLANT
TRAY FOLEY W/METER SILVER 16FR (SET/KITS/TRAYS/PACK) IMPLANT
WATER STERILE IRR 1500ML POUR (IV SOLUTION) ×3 IMPLANT
YANKAUER SUCT BULB TIP 10FT TU (MISCELLANEOUS) ×3 IMPLANT

## 2015-11-26 NOTE — Interval H&P Note (Signed)
History and Physical Interval Note:  11/26/2015 5:31 PM  Caroline Aguirre  has presented today for surgery, with the diagnosis of RIGHT FEMORAL NECK FRACTURE  The various methods of treatment have been discussed with the patient and family. After consideration of risks, benefits and other options for treatment, the patient has consented to  Procedure(s): TOTAL HIP ARTHROPLASTY ANTERIOR APPROACH (Right) as a surgical intervention .  The patient's history has been reviewed, patient examined, no change in status, stable for surgery.  I have reviewed the patient's chart and labs.  Questions were answered to the patient's satisfaction.    The risks, benefits, and alternatives were discussed with the patient / family. There are risks associated with the surgery including, but not limited to, problems with anesthesia (death), infection, instability (giving out of the joint), dislocation, differences in leg length/angulation/rotation, fracture of bones, loosening or failure of implants, hematoma (blood accumulation) which may require surgical drainage, blood clots, pulmonary embolism, nerve injury (foot drop and lateral thigh numbness), and blood vessel injury. The patient / family understand these risks and elect to proceed.    Galo Sayed, Horald Pollen

## 2015-11-26 NOTE — H&P (View-Only) (Signed)
Reason for Consult: Right hip fracture  Referring Physician: EDP  Caroline Aguirre is an 82 y.o. female.  HPI: Fell multiple times at Rehab. Recent mini strokes.  Past Medical History:  Diagnosis Date  . Arthritis   . Cancer (HCC)    Rt Breast  . Coronary artery disease   . Cyst of breast    Right - at 23 yrs of age.  . DCIS (ductal carcinoma in situ) of breast 2012   High grade DCIS; Rt breast  . Diabetes mellitus without complication (HCC)   . Diverticulitis   . Elevated cholesterol   . GERD (gastroesophageal reflux disease)   . Hardening of the arteries of the brain   . Heart disease   . Herniated disc   . Hx Breast cancer, DCIS, Right 08/19/2010  . Hypertension   . Migraine headache   . SBO (small bowel obstruction) (HCC) 09/02/11  . Shingles   . Stroke (HCC) 2004   cerebral hemmorhage    Past Surgical History:  Procedure Laterality Date  . BRAIN SURGERY     hemorrhage  . BREAST SURGERY  09/25/2010   mastectomy  . CYSTECTOMY    . ESOPHAGOGASTRODUODENOSCOPY N/A 11/13/2015   Procedure: ESOPHAGOGASTRODUODENOSCOPY (EGD);  Surgeon: Najeeb U Rehman, MD;  Location: [facility];  Service: Endoscopy;  Laterality: N/A;  . fibroid tumors    . KNEE ARTHROSCOPY Right    right knee x 3  . LAPAROSCOPIC BILATERAL SALPINGO OOPHERECTOMY    . SPINE SURGERY     repair herniated disc    Family History  Problem Relation Age of Onset  . Cancer Mother   . Heart disease Father   . Spinal muscular atrophy Sister     spinal meningitis    Social History:  reports that she has never smoked. She has never used smokeless tobacco. She reports that she does not drink alcohol or use drugs.  Allergies:  Allergies  Allergen Reactions  . Tape Rash  . Ciprofloxacin   . Sulfa Antibiotics Hives  . Codeine Nausea Only    nausea.  . Morphine And Related Nausea Only    Nausea only.    Medications: I have reviewed the patient's current medications.  Results for orders placed or  performed during the hospital encounter of 11/23/15 (from the past 48 hour(s))  CBC with Differential/Platelet     Status: Abnormal   Collection Time: 11/23/15 11:13 AM  Result Value Ref Range   WBC 15.4 (H) 4.0 - 10.5 K/uL   RBC 4.15 3.87 - 5.11 MIL/uL   Hemoglobin 11.7 (L) 12.0 - 15.0 g/dL   HCT 35.9 (L) 36.0 - 46.0 %   MCV 86.5 78.0 - 100.0 fL   MCH 28.2 26.0 - 34.0 pg   MCHC 32.6 30.0 - 36.0 g/dL   RDW 14.1 11.5 - 15.5 %   Platelets 381 150 - 400 K/uL   Neutrophils Relative % 84 %   Neutro Abs 13.1 (H) 1.7 - 7.7 K/uL   Lymphocytes Relative 8 %   Lymphs Abs 1.2 0.7 - 4.0 K/uL   Monocytes Relative 8 %   Monocytes Absolute 1.2 (H) 0.1 - 1.0 K/uL   Eosinophils Relative 0 %   Eosinophils Absolute 0.0 0.0 - 0.7 K/uL   Basophils Relative 0 %   Basophils Absolute 0.0 0.0 - 0.1 K/uL  Comprehensive metabolic panel     Status: Abnormal   Collection Time: 11/23/15 11:13 AM  Result Value Ref Range   Sodium   136 135 - 145 mmol/L   Potassium 4.4 3.5 - 5.1 mmol/L   Chloride 101 101 - 111 mmol/L   CO2 25 22 - 32 mmol/L   Glucose, Bld 185 (H) 65 - 99 mg/dL   BUN 15 6 - 20 mg/dL   Creatinine, Ser 0.84 0.44 - 1.00 mg/dL   Calcium 8.5 (L) 8.9 - 10.3 mg/dL   Total Protein 5.9 (L) 6.5 - 8.1 g/dL   Albumin 3.2 (L) 3.5 - 5.0 g/dL   AST 41 15 - 41 U/L   ALT 56 (H) 14 - 54 U/L   Alkaline Phosphatase 95 38 - 126 U/L   Total Bilirubin 1.2 0.3 - 1.2 mg/dL   GFR calc non Af Amer 60 (L) >60 mL/min   GFR calc Af Amer >60 >60 mL/min    Comment: (NOTE) The eGFR has been calculated using the CKD EPI equation. This calculation has not been validated in all clinical situations. eGFR's persistently <60 mL/min signify possible Chronic Kidney Disease.    Anion gap 10 5 - 15    Dg Chest 1 View  Result Date: 11/23/2015 CLINICAL DATA:  Status post fall today with right hip pain EXAM: CHEST 1 VIEW COMPARISON:  September 03, 2015 FINDINGS: The heart size and mediastinal contours are stable. The heart size is  enlarged. There is consolidation of bilateral lung bases with bilateral pleural effusions. There is mild interstitial edema. The bones are stable. IMPRESSION: Mild congestive heart failure. Patchy consolidation of bilateral lung bases with bilateral pleural effusions, underlying pneumonia is not excluded. Electronically Signed   By: Wei-Chen  Lin M.D.   On: 11/23/2015 11:40   Ct Head Wo Contrast  Result Date: 11/23/2015 CLINICAL DATA:  82-year-old female with a history of fall. Right hip pain EXAM: CT HEAD WITHOUT CONTRAST CT CERVICAL SPINE WITHOUT CONTRAST TECHNIQUE: Multidetector CT imaging of the head and cervical spine was performed following the standard protocol without intravenous contrast. Multiplanar CT image reconstructions of the cervical spine were also generated. COMPARISON:  MR brain 03/18/2015, head CT 03/16/2015 FINDINGS: CT HEAD FINDINGS Unremarkable appearance of the calvarium without acute fracture or aggressive lesion. Unremarkable appearance of the scalp soft tissues. Unremarkable appearance of the bilateral orbits. Mastoid air cells are clear. No significant paranasal sinus disease Small calcified meningioma along the inner table of the left temporal bone, unchanged. No local mass effect. No acute intracranial hemorrhage, midline shift, or mass effect. Unchanged configuration of the ventricles. Gray-white differentiation maintained. Confluent hypodensity in the periventricular white matter, most pronounced in the posterior right parietal and occipital lobe. Hyperdense cortex of the right occipital region unchanged from the comparison. No local mass effect. Dense intracranial atherosclerosis. CT CERVICAL SPINE FINDINGS Craniocervical junction aligned. No acute fracture at the skullbase identified. Anatomic alignment of the cervical elements is relatively maintained. No subluxation. Vertebral body heights maintained. No fracture line identified. Alignment of the facets maintain. Multilevel  degenerative changes throughout the cervical spine with disc space narrowing, uncovertebral joint disease, and posterior disc osteophyte complexes. No significant bony canal narrowing. Uncovertebral joint disease and facet disease contributes to varying degrees of bilateral neural foraminal narrowing. No evidence of epidural hemorrhage. Right thyroid cyst. Irregular parenchyma of the bilateral thyroid. Calcifications of the carotid vasculature. Bilateral pleural effusions layer dependently at the lung apices. IMPRESSION: Head CT: No CT evidence of acute intracranial abnormality. Similar changes of chronic white matter disease and mineralization of cortex in the right parieto-occipital region. Intracranial atherosclerosis. Cervical spine CT: No CT evidence   of acute fracture or malalignment of the cervical spine. Multilevel degenerative disc disease, uncovertebral joint disease, as well as facet changes contribute to varying degrees of bilateral neural foraminal narrowing of the cervical spine. Bilateral pleural effusions layered at the lung apices. Nodular and cystic changes of the thyroid. If clinically warranted, further evaluation with ultrasound may be considered. Signed, Jaime S. Wagner, DO [facility] Electronically Signed   By: Jaime  Wagner D.O.   On: 11/23/2015 12:13   Ct Cervical Spine Wo Contrast  Result Date: 11/23/2015 CLINICAL DATA:  82-year-old female with a history of fall. Right hip pain EXAM: CT HEAD WITHOUT CONTRAST CT CERVICAL SPINE WITHOUT CONTRAST TECHNIQUE: Multidetector CT imaging of the head and cervical spine was performed following the standard protocol without intravenous contrast. Multiplanar CT image reconstructions of the cervical spine were also generated. COMPARISON:  MR brain 03/18/2015, head CT 03/16/2015 FINDINGS: CT HEAD FINDINGS Unremarkable appearance of the calvarium without acute fracture or aggressive lesion.  Unremarkable appearance of the scalp soft tissues. Unremarkable appearance of the bilateral orbits. Mastoid air cells are clear. No significant paranasal sinus disease Small calcified meningioma along the inner table of the left temporal bone, unchanged. No local mass effect. No acute intracranial hemorrhage, midline shift, or mass effect. Unchanged configuration of the ventricles. Gray-white differentiation maintained. Confluent hypodensity in the periventricular white matter, most pronounced in the posterior right parietal and occipital lobe. Hyperdense cortex of the right occipital region unchanged from the comparison. No local mass effect. Dense intracranial atherosclerosis. CT CERVICAL SPINE FINDINGS Craniocervical junction aligned. No acute fracture at the skullbase identified. Anatomic alignment of the cervical elements is relatively maintained. No subluxation. Vertebral body heights maintained. No fracture line identified. Alignment of the facets maintain. Multilevel degenerative changes throughout the cervical spine with disc space narrowing, uncovertebral joint disease, and posterior disc osteophyte complexes. No significant bony canal narrowing. Uncovertebral joint disease and facet disease contributes to varying degrees of bilateral neural foraminal narrowing. No evidence of epidural hemorrhage. Right thyroid cyst. Irregular parenchyma of the bilateral thyroid. Calcifications of the carotid vasculature. Bilateral pleural effusions layer dependently at the lung apices. IMPRESSION: Head CT: No CT evidence of acute intracranial abnormality. Similar changes of chronic white matter disease and mineralization of cortex in the right parieto-occipital region. Intracranial atherosclerosis. Cervical spine CT: No CT evidence of acute fracture or malalignment of the cervical spine. Multilevel degenerative disc disease, uncovertebral joint disease, as well as facet changes contribute to varying degrees of bilateral  neural foraminal narrowing of the cervical spine. Bilateral pleural effusions layered at the lung apices. Nodular and cystic changes of the thyroid. If clinically warranted, further evaluation with ultrasound may be considered. Signed, Jaime S. Wagner, DO [facility] Electronically Signed   By: Jaime  Wagner D.O.   On: 11/23/2015 12:13   Dg Hip Unilat  With Pelvis 2-3 Views Right  Result Date: 11/23/2015 CLINICAL DATA:  Status post fall today with right hip pain EXAM: DG HIP (WITH OR WITHOUT PELVIS) 2-3V RIGHT COMPARISON:  None. FINDINGS: There is displaced fracture at the base of the right femoral neck. There is no dislocation. IMPRESSION: Fracture at the base of the right femoral neck. Electronically Signed   By: Wei-Chen  Lin M.D.   On: 11/23/2015 11:39    Review of Systems  Constitutional: Positive for malaise/fatigue.  Musculoskeletal: Positive for joint pain.  All other systems reviewed and are negative.  Blood pressure 131/62, pulse 89,   temperature 97.7 F (36.5 C), temperature source Oral, resp. rate 17, height 5' 3" (1.6 m), weight 54.4 kg (120 lb), SpO2 98 %. Physical Exam  Constitutional: She appears well-developed.  HENT:  Head: Normocephalic.  Eyes: Pupils are equal, round, and reactive to light.  Neck: Normal range of motion.  Cardiovascular: Normal rate.   Respiratory: Effort normal.  GI: Soft.  Musculoskeletal: She exhibits edema and tenderness.  Right leg shortened and externally rotated. Neurovascularly intact. No DVT.  Neurological: She is alert.  Skin: Skin is warm and dry.  Psychiatric: She has a normal mood and affect.    Assessment/Plan: Right femoral neck fracture displaced, Will require Hemiarthroplasty after coagulopathy resolves. Discussed with family.  Sanvi Ehler C 11/23/2015, 2:05 PM  [phone]   

## 2015-11-26 NOTE — Anesthesia Procedure Notes (Signed)
Procedure Name: Intubation Date/Time: 11/26/2015 5:54 PM Performed by: Dione Booze Pre-anesthesia Checklist: Emergency Drugs available, Suction available, Patient being monitored and Patient identified Patient Re-evaluated:Patient Re-evaluated prior to inductionOxygen Delivery Method: Circle system utilized Preoxygenation: Pre-oxygenation with 100% oxygen Intubation Type: IV induction Ventilation: Mask ventilation without difficulty Laryngoscope Size: Mac and 4 Grade View: Grade I Tube type: Oral Tube size: 7.5 mm Number of attempts: 1 Airway Equipment and Method: Stylet Placement Confirmation: ETT inserted through vocal cords under direct vision,  positive ETCO2 and breath sounds checked- equal and bilateral Secured at: 21 cm Tube secured with: Tape Dental Injury: Teeth and Oropharynx as per pre-operative assessment

## 2015-11-26 NOTE — Progress Notes (Signed)
PROGRESS NOTE    Caroline Aguirre  [id number] DOB: 26-Feb-1925 DOA: 11/23/2015 PCP: Alonza Bogus, MD   Chief Complaint  Patient presents with  . Fall    Brief Narrative:  HPI on 11/23/2015 by Dr. Murray Hodgkins 82 year old woman with dementia, coronary artery disease, recent admission for GI bleed, presented to the emergency department after a fall onto her right side at the nursing home. Imaging revealed right hip fracture. Family requested transfer to [facility] for orthopedic care. Orthopedics will see in consultation, requested transfer to [facility] long.  Patient has dementia. Her healthcare power of attorney/second cousin [name] is at bedside and reports that since EGD patient has been confused. Patient cannot provide any history. Patient has been confused at the skilled nursing facility. Per [name] patient has had multiple falls at the skilled nursing facility since discharge. Patient fell this morning and she was notified. She is not aware of any other particular medical problems or acute issues at this point. [facility] [facility] called by no answer.  Assessment & Plan   Closed right hip fracture -s/p fall -Orthopedics consulted and appreciated -Pending surgical intervention this evening -Continue supportive care, pain control  Atrial fibrillation with RVR -Currently rate controlled -Last Eliquis dose was Sunday -Currently on Lovneox (needs to be held 12hrs prior to surgery) -Continue metoprolol -CHADSVASC 7 (h/o stroke 2004, DM, HTN, gender, age)  UTI -Continue rocephin -Urine culture: >100K staphylococcus aureus  Dementia/ Acute encephalopathy -Likely complicated by pain, UTI, hip fracture  Coronary artery disease -continue statin, losartan, metoprolol  Diabetes mellitus, type II -Currently diet controlled  DVT Prophylaxis  Lovenox  Code Status: DNR  Family Communication: Son at bedside  Disposition Plan: Admitted, pending surgery.   Consultants Orthopedic  surgery  Procedures  None  Antibiotics   Anti-infectives    Start     Dose/Rate Route Frequency Ordered Stop   11/23/15 2200  trimethoprim (TRIMPEX) tablet 100 mg     100 mg Oral Daily at bedtime 11/23/15 1939     11/23/15 1630  cefTRIAXone (ROCEPHIN) 1 g in dextrose 5 % 50 mL IVPB     1 g 100 mL/hr over 30 Minutes Intravenous Every 24 hours 11/23/15 1606        Subjective:   Caroline Aguirre seen and examined today.  Patient demented and cannot answer any questions.  Just yells out.  Objective:   Vitals:   11/25/15 1659 11/25/15 2116 11/26/15 0547 11/26/15 0932  BP: (!) 149/71  (!) 148/79   Pulse: (!) 113 (!) 107 100 (!) 120  Resp:  20 20   Temp:  98.1 F (36.7 C) 98.3 F (36.8 C)   TempSrc:  Oral Oral   SpO2:  95% 98%   Weight:      Height:        Intake/Output Summary (Last 24 hours) at 11/26/15 1259 Last data filed at 11/26/15 0711  Gross per 24 hour  Intake              330 ml  Output              42 5 ml  Net              -95 ml   Filed Weights   11/23/15 1032  Weight: 54.4 kg (120 lb)    Exam  General: Well developed,elder, NAD  HEENT: NCAT, mucous membranes moist.   Cardiovascular: S1 S2 auscultated, irregular  Respiratory: Clear to auscultation bilaterally with equal chest rise  Abdomen:  Soft, nontender, nondistended, + bowel sounds  Extremities: warm dry without cyanosis clubbing or edema  Neuro:Awake, cannot assess orientation.  Does not respond to questions or commands. Yells.  Psych: cannot assess due to current mental state   Data Reviewed: I have personally reviewed following labs and imaging studies  CBC:  Recent Labs Lab 11/23/15 1113 11/24/15 0439  WBC 15.4* 17.1*  NEUTROABS 13.1*  --   HGB 11.7* 11.7*  HCT 35.9* 35.8*  MCV 86.5 84.4  PLT 381 [id number]   Basic Metabolic Panel:  Recent Labs Lab 11/23/15 1113 11/24/15 0439  NA 136 141  K 4.4 4.4  CL 101 105  CO2 25 26  GLUCOSE 185* 175*  BUN 15 17  CREATININE 0.84  1.01*  CALCIUM 8.5* 8.9   GFR: Estimated Creatinine Clearance: 31.2 mL/min (by C-G formula based on SCr of 1.01 mg/dL). Liver Function Tests:  Recent Labs Lab 11/23/15 1113  AST 41  ALT 56*  ALKPHOS 95  BILITOT 1.2  PROT 5.9*  ALBUMIN 3.2*   No results for input(s): LIPASE, AMYLASE in the last 168 hours. No results for input(s): AMMONIA in the last 168 hours. Coagulation Profile: No results for input(s): INR, PROTIME in the last 168 hours. Cardiac Enzymes: No results for input(s): CKTOTAL, CKMB, CKMBINDEX, TROPONINI in the last 168 hours. BNP (last 3 results) No results for input(s): PROBNP in the last 8760 hours. HbA1C: No results for input(s): HGBA1C in the last 72 hours. CBG:  Recent Labs Lab 11/25/15 1149 11/25/15 1839 11/25/15 2111 11/26/15 0742 11/26/15 1148  GLUCAP 130* 189* 129* 122* 111*   Lipid Profile: No results for input(s): CHOL, HDL, LDLCALC, TRIG, CHOLHDL, LDLDIRECT in the last 72 hours. Thyroid Function Tests: No results for input(s): TSH, T4TOTAL, FREET4, T3FREE, THYROIDAB in the last 72 hours. Anemia Panel: No results for input(s): VITAMINB12, FOLATE, FERRITIN, TIBC, IRON, RETICCTPCT in the last 72 hours. Urine analysis:    Component Value Date/Time   COLORURINE YELLOW 11/23/2015 1310   APPEARANCEUR HAZY (A) 11/23/2015 1310   LABSPEC 1.010 11/23/2015 1310   PHURINE 6.5 11/23/2015 1310   GLUCOSEU NEGATIVE 11/23/2015 1310   HGBUR TRACE (A) 11/23/2015 1310   BILIRUBINUR NEGATIVE 11/23/2015 1310   KETONESUR NEGATIVE 11/23/2015 1310   PROTEINUR NEGATIVE 11/23/2015 1310   UROBILINOGEN 0.2 11/16/2014 0939   NITRITE POSITIVE (A) 11/23/2015 1310   LEUKOCYTESUR LARGE (A) 11/23/2015 1310   Sepsis Labs: @LABRCNTIP (procalcitonin:4,lacticidven:4)  ) Recent Results (from the past 240 hour(s))  Culture, Urine     Status: Abnormal   Collection Time: 11/23/15  4:02 PM  Result Value Ref Range Status   Specimen Description URINE, RANDOM  Final    Special Requests NONE  Final   Culture >=100,000 COLONIES/mL STAPHYLOCOCCUS AUREUS (A)  Final   Report Status 11/26/2015 FINAL  Final   Organism ID, Bacteria STAPHYLOCOCCUS AUREUS (A)  Final      Susceptibility   Staphylococcus aureus - MIC*    CIPROFLOXACIN >=8 RESISTANT Resistant     GENTAMICIN <=0.5 SENSITIVE Sensitive     NITROFURANTOIN <=16 SENSITIVE Sensitive     OXACILLIN 0.5 SENSITIVE Sensitive     TETRACYCLINE <=1 SENSITIVE Sensitive     VANCOMYCIN <=0.5 SENSITIVE Sensitive     TRIMETH/SULFA <=10 SENSITIVE Sensitive     CLINDAMYCIN <=0.25 SENSITIVE Sensitive     RIFAMPIN <=0.5 SENSITIVE Sensitive     Inducible Clindamycin NEGATIVE Sensitive     * >=100,000 COLONIES/mL STAPHYLOCOCCUS AUREUS  Surgical PCR screen  Status: Abnormal   Collection Time: 11/24/15  8:40 AM  Result Value Ref Range Status   MRSA, PCR NEGATIVE NEGATIVE Final   Staphylococcus aureus POSITIVE (A) NEGATIVE Final    Comment:        The Xpert SA Assay (FDA approved for NASAL specimens in patients over 60 years of age), is one component of a comprehensive surveillance program.  Test performance has been validated by [facility] for patients greater than or equal to 12 year old. It is not intended to diagnose infection nor to guide or monitor treatment.       Radiology Studies: No results found.   Scheduled Meds: . atorvastatin  80 mg Oral Daily  . cefTRIAXone (ROCEPHIN)  IV  1 g Intravenous Q24H  . [START ON 11/27/2015] enoxaparin (LOVENOX) injection  50 mg Subcutaneous BID  . feeding supplement (ENSURE ENLIVE)  237 mL Oral BID BM  . furosemide  20 mg Oral Daily  . gabapentin  100 mg Oral BID  . insulin aspart  0-9 Units Subcutaneous TID WC  . losartan  50 mg Oral Daily  . metoprolol  75 mg Oral BID  . pantoprazole  40 mg Oral BID  . senna  1 tablet Oral BID  . trimethoprim  100 mg Oral QHS   Continuous Infusions:    LOS: 3 days   Time Spent in minutes   30 minutes  Jacorey Donaway,  Orell Hurtado D.O. on 11/26/2015 at 12:59 PM  Between 7am to 7pm - Pager - [phone]  After 7pm go to [email or web address] - password [email or web address]  And look for the night coverage person covering for me after hours  [facility] Office  [phone]

## 2015-11-26 NOTE — Evaluation (Signed)
SLP Cancellation Note  Patient Details Name: Caroline Aguirre MRN: [id number] DOB: June 29, 1924   Cancelled treatment:       Reason Eval/Treat Not Completed: Medical issues which prohibited therapy (pt currently npo, ? if due to possible surgery, will continue efforts)   Luanna Salk, [facility] SLP [phone]

## 2015-11-26 NOTE — Anesthesia Preprocedure Evaluation (Signed)
Anesthesia Evaluation  Patient identified by MRN, date of birth, ID band Patient confused    Reviewed: Allergy & Precautions, NPO status , Patient's Chart, lab work & pertinent test results, reviewed documented beta blocker date and time   Airway Mallampati: I  TM Distance: >3 FB Neck ROM: Full    Dental  (+) Teeth Intact, Dental Advisory Given   Pulmonary    breath sounds clear to auscultation       Cardiovascular hypertension, + CAD and + Peripheral Vascular Disease   Rhythm:Regular Rate:Normal     Neuro/Psych CVA, Residual Symptoms    GI/Hepatic GERD  Medicated and Controlled,  Endo/Other  diabetes, Well Controlled, Type 2, Oral Hypoglycemic Agents  Renal/GU      Musculoskeletal   Abdominal   Peds  Hematology   Anesthesia Other Findings   Reproductive/Obstetrics                            Anesthesia Physical Anesthesia Plan  ASA: IV  Anesthesia Plan: General   Post-op Pain Management:    Induction: Intravenous  Airway Management Planned: Oral ETT  Additional Equipment:   Intra-op Plan:   Post-operative Plan: Extubation in OR  Informed Consent: I have reviewed the patients History and Physical, chart, labs and discussed the procedure including the risks, benefits and alternatives for the proposed anesthesia with the patient or authorized representative who has indicated his/her understanding and acceptance.   Dental advisory given  Plan Discussed with: CRNA, Anesthesiologist and Surgeon  Anesthesia Plan Comments:         Anesthesia Quick Evaluation

## 2015-11-26 NOTE — Op Note (Signed)
OPERATIVE REPORT  SURGEON: Rod Can, MD   ASSISTANT: None.  PREOPERATIVE DIAGNOSIS: Displaced Right basicervical femoral neck fracture.   POSTOPERATIVE DIAGNOSIS: Displaced Right basicervical femoral neck fracture.   PROCEDURE: Right hip hemiarthroplasty, anterior approach.   IMPLANTS: DePuy AML large stature stem, size 13.5 x 160 mm, with a 0 mm spacer and a 45 mm monopolar head ball.  ANESTHESIA:  General  ANTIBIOTICS: 2 g Ancef.  ESTIMATED BLOOD LOSS: 200 mL.  DRAINS: None.  COMPLICATIONS: None   CONDITION: PACU - hemodynamically stable.   BRIEF CLINICAL NOTE: Caroline Aguirre is a 82 y.o. female with a displaced Right basicervical femoral neck fracture. The patient was admitted to the hospitalist service and underwent perioperative risk stratification and medical optimization. The risks, benefits, and alternatives to hemiarthroplasty vs ORIF were explained, and the patient / family elected to proceed.  PROCEDURE IN DETAIL: The patient was taken to the operating room and general anesthesia was induced on the hospital bed. The patient was then positioned on the Hana table. All bony prominences were well padded. The hip was prepped and draped in the normal sterile surgical fashion. A time-out was called verifying side and site of surgery. Antibiotics were given within 60 minutes of beginning the procedure.  The direct anterior approach to the hip was performed through the Hueter interval. Lateral femoral circumflex vessels were treated with the Auqumantys. The anterior capsule was exposed and an inverted T capsulotomy was made. Fracture hematoma was encountered and evacuated. The patient was found to have a comminuted Right basicervical femoral neck fracture. I freshened the femoral neck cut with a saw. I removed the femoral neck fragment. A corkscrew was placed into the head and the head was removed. This was passed to the back table and was  measured.  Acetabular exposure was achieved. I examined the articular cartilage which was intact. The labrum was intact. A 45 mm trial head was placed and found to have excellent fit.  I then gained femoral exposure taking care to protect the abductors and greater trochanter. This was performed using standard external rotation, extension, and adduction. The capsule was peeled off the inner aspect of the greater trochanter, taking care to preserve the short external rotators. The fracture involved the posterior cortex of the femoral neck, and extended to the lesser trochanter. There was no substantial supportive metaphyseal bone. The posterior aspect of the greater trochanter was fractured. A cookie cutter was used to enter the femoral canal, and then the femoral canal finder was used to confirm location. I chose to prepare for an AML stem in order to obtain distal fixation. I placed a single adult reconstruction cable subperiosteally below the lesser trochanter. I reamed sequentially up to a 13.5 mm reamer. I broached up to a size 13.5 large stature stem in order to achieve better fill. I paced a neck and a 36+ 0 head ball.The hip was reduced. Leg lengths were checked fluoroscopically. The hip was dislocated and trial components were removed. I placed the real stem followed by the real spacer and head ball. A single reduction maneuver was performed and the hip was reduced. Fluoroscopy was used to confirm component position and leg lengths. At 90 degrees of external rotation and extension, the hip was stable to an anterior directed force.  The wound was copiously irrigated with normal saline solution. Marcaine solution was injected into the periarticular soft tissue. The wound was closed in layers using #1 Vicryl and V-Loc for the fascia, 2-0 Vicryl for  the subcutaneous fat, 2-0 Monocryl for the deep dermal layer, 3-0 running Monocryl subcuticular stitch and glue for the skin. Once the glue  was fully dried, an Aquacell Ag dressing was applied. The patient was then awakened from anesthesia and transported to the recovery room in stable condition. Sponge, needle, and instrument counts were correct at the end of the case x2. The patient tolerated the procedure well and there were no known complications.

## 2015-11-26 NOTE — Transfer of Care (Signed)
Immediate Anesthesia Transfer of Care Note  Patient: Caroline Aguirre  Procedure(s) Performed: Procedure(s): HEMI HIP ARTHROPLASTY ANTERIOR APPROACH (Right)  Patient Location: PACU  Anesthesia Type:General  Level of Consciousness: awake and patient cooperative  Airway & Oxygen Therapy: Patient Spontanous Breathing and Patient connected to face mask oxygen  Post-op Assessment: Report given to RN and Post -op Vital signs reviewed and stable  Post vital signs: Reviewed and stable  Last Vitals:  Vitals:   11/26/15 0932 11/26/15 1608  BP:  118/78  Pulse: (!) 120 88  Resp:  19  Temp:  36.4 C    Last Pain:  Vitals:   11/26/15 1608  TempSrc: Oral  PainSc:       Patients Stated Pain Goal: 4 ([id number])  Complications: No apparent anesthesia complications

## 2015-11-27 ENCOUNTER — Encounter (HOSPITAL_COMMUNITY): Payer: Self-pay | Admitting: Orthopedic Surgery

## 2015-11-27 LAB — GLUCOSE, CAPILLARY
GLUCOSE-CAPILLARY: 171 mg/dL — AB (ref 65–99)
Glucose-Capillary: 170 mg/dL — ABNORMAL HIGH (ref 65–99)
Glucose-Capillary: 152 mg/dL — ABNORMAL HIGH (ref 65–99)
Glucose-Capillary: 185 mg/dL — ABNORMAL HIGH (ref 65–99)

## 2015-11-27 LAB — BASIC METABOLIC PANEL
Anion gap: 10 (ref 5–15)
BUN: 36 mg/dL — ABNORMAL HIGH (ref 6–20)
CALCIUM: 8.6 mg/dL — AB (ref 8.9–10.3)
CO2: 27 mmol/L (ref 22–32)
CREATININE: 0.97 mg/dL (ref 0.44–1.00)
Chloride: 103 mmol/L (ref 101–111)
GFR, EST AFRICAN AMERICAN: 58 mL/min — AB (ref >60)
GFR, EST NON AFRICAN AMERICAN: 50 mL/min — AB (ref >60)
Glucose, Bld: 161 mg/dL — ABNORMAL HIGH (ref 65–99)
Potassium: 4.4 mmol/L (ref 3.5–5.1)
SODIUM: 140 mmol/L (ref 135–145)

## 2015-11-27 LAB — CBC
HCT: 35.5 % — ABNORMAL LOW (ref 36.0–46.0)
Hemoglobin: 11.8 g/dL — ABNORMAL LOW (ref 12.0–15.0)
MCH: 27.9 pg (ref 26.0–34.0)
MCHC: 33.2 g/dL (ref 30.0–36.0)
MCV: 83.9 fL (ref 78.0–100.0)
PLATELETS: 318 10*3/uL (ref 150–400)
RBC: 4.23 MIL/uL (ref 3.87–5.11)
RDW: 14 % (ref 11.5–15.5)
WBC: 13.6 10*3/uL — AB (ref 4.0–10.5)

## 2015-11-27 MED ORDER — TRAMADOL HCL 50 MG PO TABS: 50.0000 mg | ORAL_TABLET | Freq: Four times a day (QID) | ORAL | Status: DC | PRN

## 2015-11-27 MED ORDER — CEFUROXIME AXETIL 250 MG PO TABS
250.0000 mg | ORAL_TABLET | Freq: Two times a day (BID) | ORAL | Status: DC
  Administered 2015-11-27 – 2015-11-28 (×2): 250 mg via ORAL
  Filled 2015-11-27 (×4): qty 1

## 2015-11-27 MED ORDER — MORPHINE SULFATE (PF) 2 MG/ML IV SOLN
1.0000 mg | Freq: Once | INTRAVENOUS | Status: AC
  Administered 2015-11-27: 1 mg via INTRAVENOUS
  Filled 2015-11-27: qty 1

## 2015-11-27 MED ORDER — OXYCODONE HCL 5 MG PO TABS: 5.0000 mg | ORAL_TABLET | Freq: Four times a day (QID) | ORAL | Status: DC | PRN

## 2015-11-27 MED ORDER — MORPHINE SULFATE (PF) 2 MG/ML IV SOLN
2.0000 mg | INTRAVENOUS | Status: DC | PRN
  Administered 2015-11-27 – 2015-11-28 (×5): 2 mg via INTRAVENOUS
  Filled 2015-11-27 (×5): qty 1

## 2015-11-27 NOTE — Progress Notes (Signed)
   Subjective:  Patient reports pain as mild to moderate.    Objective:   VITALS:   Vitals:   11/26/15 2130 11/26/15 2145 11/27/15 0500 11/27/15 1329  BP: 134/69 122/87 (!) 134/119 104/69  Pulse: 92 (!) 116 (!) 128 (!) 128  Resp: 18 17 18 20   Temp: 97.6 F (36.4 C) 97.6 F (36.4 C) 98.5 F (36.9 C) 97.5 F (36.4 C)  TempSrc:   Oral Axillary  SpO2: 96% 97% 100% 91%  Weight:      Height:        NAD, confused ABD soft Intact pulses distally Dorsiflexion/Plantar flexion intact Incision: dressing C/D/I Compartment soft   Lab Results  Component Value Date   WBC 13.6 (H) 11/27/2015   HGB 11.8 (L) 11/27/2015   HCT 35.5 (L) 11/27/2015   MCV 83.9 11/27/2015   PLT 318 11/27/2015   BMET    Component Value Date/Time   NA 140 11/27/2015 0524   K 4.4 11/27/2015 0524   CL 103 11/27/2015 0524   CO2 27 11/27/2015 0524   GLUCOSE 161 (H) 11/27/2015 0524   BUN 36 (H) 11/27/2015 0524   CREATININE 0.97 11/27/2015 0524   CREATININE 1.27 (H) 09/04/2015 1413   CALCIUM 8.6 (L) 11/27/2015 0524   GFRNONAA 50 (L) 11/27/2015 0524   GFRAA 58 (L) 11/27/2015 0524     Assessment/Plan: 1 Day Post-Op   Principal Problem:   Closed right hip fracture (HCC) Active Problems:   Hearing loss   Acute encephalopathy   DM type 2 (diabetes mellitus, type 2) (HCC)   Coronary artery disease   Atrial fibrillation (HCC)   Femoral neck fracture, right, closed, initial encounter   WBAT with walker DVT ppx: resume Eliquis PO pain control PT/OT Dispo: most appropriate dispo likely d/c to memory care with PT 3x/week, currently not a candidate for skilled services given severe confusion; please discuss d/c planning with Leonides Grills, RN case manager with [facility]   Hawken Bielby, Horald Pollen 11/27/2015, 6:52 PM   Rod Can, MD Cell [phone]

## 2015-11-27 NOTE — Progress Notes (Signed)
PROGRESS NOTE    Caroline Aguirre  [id number] DOB: 1925-03-01 DOA: 11/23/2015 PCP: Alonza Bogus, MD   Chief Complaint  Patient presents with  . Fall    Brief Narrative:  HPI on 11/23/2015 by Dr. Murray Hodgkins 82 year old woman with dementia, coronary artery disease, recent admission for GI bleed, presented to the emergency department after a fall onto her right side at the nursing home. Imaging revealed right hip fracture. Family requested transfer to [facility] for orthopedic care. Orthopedics will see in consultation, requested transfer to [facility] long.  Patient has dementia. Her healthcare power of attorney/second cousin Caroline Aguirre is at bedside and reports that since EGD patient has been confused. Patient cannot provide any history. Patient has been confused at the skilled nursing facility. Per Caroline Aguirre patient has had multiple falls at the skilled nursing facility since discharge. Patient fell this morning and she was notified. She is not aware of any other particular medical problems or acute issues at this point. [place] called by no answer.  Assessment & Plan   Closed right hip fracture -s/p fall -Orthopedics consulted and appreciated, s/p Right hip hemiarthroplasty -Pending surgical intervention this evening -Continue supportive care, pain control  Atrial fibrillation with RVR -Currently rate controlled -Was initially placed on lovenox, Eliquis was held for surgery -Will restart Eliquis today -Continue metoprolol -CHADSVASC 7 (h/o stroke 2004, DM, HTN, gender, age)  UTI -Initially placed on rocephin- will transition to Ceftin -Urine culture: >100K staphylococcus aureus  Dementia/ Acute encephalopathy -Likely complicated by pain, UTI, hip fracture -Currently has hand mitts in place  Coronary artery disease -continue statin, losartan, metoprolol  Diabetes mellitus, type II -Currently diet controlled  DVT Prophylaxis  Lovenox  Code Status: DNR  Family  Communication: Son at bedside  Disposition Plan: Admitted, pending surgery.   Consultants Orthopedic surgery  Procedures  Right hip hemiarthroplasty  Antibiotics   Anti-infectives    Start     Dose/Rate Route Frequency Ordered Stop   11/27/15 1200  cefUROXime (CEFTIN) tablet 250 mg     250 mg Oral 2 times daily with meals 11/27/15 1118     11/26/15 2359  ceFAZolin (ANCEF) IVPB 2g/100 mL premix     2 g 200 mL/hr over 30 Minutes Intravenous Every 6 hours 11/26/15 2245 11/27/15 0540   11/26/15 1800  ceFAZolin (ANCEF) IVPB 2g/100 mL premix     2 g 200 mL/hr over 30 Minutes Intravenous On call to O.R. 11/26/15 1741 11/26/15 1807   11/23/15 2200  trimethoprim (TRIMPEX) tablet 100 mg     100 mg Oral Daily at bedtime 11/23/15 1939     11/23/15 1630  cefTRIAXone (ROCEPHIN) 1 g in dextrose 5 % 50 mL IVPB  Status:  Discontinued     1 g 100 mL/hr over 30 Minutes Intravenous Every 24 hours 11/23/15 1606 11/27/15 1118      Subjective:   Caroline Aguirre seen and examined today.  Patient demented and cannot answer any questions.  Just yells out.  Objective:   Vitals:   11/26/15 2115 11/26/15 2130 11/26/15 2145 11/27/15 0500  BP: 139/84 134/69 122/87 (!) 134/119  Pulse: (!) 104 92 (!) 116 (!) 128  Resp: 16 18 17 18   Temp:  97.6 F (36.4 C) 97.6 F (36.4 C) 98.5 F (36.9 C)  TempSrc:    Oral  SpO2: 97% 96% 97% 100%  Weight:      Height:        Intake/Output Summary (Last 24 hours) at 11/27/15 1139 Last  data filed at 11/27/15 0659  Gross per 24 hour  Intake             1075 ml  Output              850 ml  Net              225 ml   Filed Weights   11/23/15 1032  Weight: 54.4 kg (120 lb)    Exam  General: Well developed,elder, NAD  HEENT: NCAT, mucous membranes moist.   Cardiovascular: S1 S2 auscultated, irregular  Respiratory: Clear to auscultation bilaterally with equal chest rise  Abdomen: Soft, nontender, nondistended, + bowel sounds  Extremities: warm dry  without cyanosis clubbing or edema  Neuro:Awake, cannot assess orientation.  Does not respond to questions or commands. Yells.  Psych: cannot assess due to current mental state   Data Reviewed: I have personally reviewed following labs and imaging studies  CBC:  Recent Labs Lab 11/23/15 1113 11/24/15 0439 11/27/15 0524  WBC 15.4* 17.1* 13.6*  NEUTROABS 13.1*  --   --   HGB 11.7* 11.7* 11.8*  HCT 35.9* 35.8* 35.5*  MCV 86.5 84.4 83.9  PLT 381 355 0000000   Basic Metabolic Panel:  Recent Labs Lab 11/23/15 1113 11/24/15 0439 11/27/15 0524  NA 136 141 140  K 4.4 4.4 4.4  CL 101 105 103  CO2 25 26 27   GLUCOSE 185* 175* 161*  BUN 15 17 36*  CREATININE 0.84 1.01* 0.97  CALCIUM 8.5* 8.9 8.6*   GFR: Estimated Creatinine Clearance: 32.5 mL/min (by C-G formula based on SCr of 0.97 mg/dL). Liver Function Tests:  Recent Labs Lab 11/23/15 1113  AST 41  ALT 56*  ALKPHOS 95  BILITOT 1.2  PROT 5.9*  ALBUMIN 3.2*   No results for input(s): LIPASE, AMYLASE in the last 168 hours. No results for input(s): AMMONIA in the last 168 hours. Coagulation Profile: No results for input(s): INR, PROTIME in the last 168 hours. Cardiac Enzymes: No results for input(s): CKTOTAL, CKMB, CKMBINDEX, TROPONINI in the last 168 hours. BNP (last 3 results) No results for input(s): PROBNP in the last 8760 hours. HbA1C: No results for input(s): HGBA1C in the last 72 hours. CBG:  Recent Labs Lab 11/26/15 0742 11/26/15 1148 11/26/15 1634 11/26/15 2113 11/27/15 0804  GLUCAP 122* 111* 111* 154* 170*   Lipid Profile: No results for input(s): CHOL, HDL, LDLCALC, TRIG, CHOLHDL, LDLDIRECT in the last 72 hours. Thyroid Function Tests: No results for input(s): TSH, T4TOTAL, FREET4, T3FREE, THYROIDAB in the last 72 hours. Anemia Panel: No results for input(s): VITAMINB12, FOLATE, FERRITIN, TIBC, IRON, RETICCTPCT in the last 72 hours. Urine analysis:    Component Value Date/Time   COLORURINE  YELLOW 11/23/2015 1310   APPEARANCEUR HAZY (A) 11/23/2015 1310   LABSPEC 1.010 11/23/2015 1310   PHURINE 6.5 11/23/2015 1310   GLUCOSEU NEGATIVE 11/23/2015 1310   HGBUR TRACE (A) 11/23/2015 1310   BILIRUBINUR NEGATIVE 11/23/2015 1310   KETONESUR NEGATIVE 11/23/2015 1310   PROTEINUR NEGATIVE 11/23/2015 1310   UROBILINOGEN 0.2 11/16/2014 0939   NITRITE POSITIVE (A) 11/23/2015 1310   LEUKOCYTESUR LARGE (A) 11/23/2015 1310   Sepsis Labs: @LABRCNTIP (procalcitonin:4,lacticidven:4)  ) Recent Results (from the past 240 hour(s))  Culture, Urine     Status: Abnormal   Collection Time: 11/23/15  4:02 PM  Result Value Ref Range Status   Specimen Description URINE, RANDOM  Final   Special Requests NONE  Final   Culture >=100,000 COLONIES/mL STAPHYLOCOCCUS AUREUS (  A)  Final   Report Status 11/26/2015 FINAL  Final   Organism ID, Bacteria STAPHYLOCOCCUS AUREUS (A)  Final      Susceptibility   Staphylococcus aureus - MIC*    CIPROFLOXACIN >=8 RESISTANT Resistant     GENTAMICIN <=0.5 SENSITIVE Sensitive     NITROFURANTOIN <=16 SENSITIVE Sensitive     OXACILLIN 0.5 SENSITIVE Sensitive     TETRACYCLINE <=1 SENSITIVE Sensitive     VANCOMYCIN <=0.5 SENSITIVE Sensitive     TRIMETH/SULFA <=10 SENSITIVE Sensitive     CLINDAMYCIN <=0.25 SENSITIVE Sensitive     RIFAMPIN <=0.5 SENSITIVE Sensitive     Inducible Clindamycin NEGATIVE Sensitive     * >=100,000 COLONIES/mL STAPHYLOCOCCUS AUREUS  Surgical PCR screen     Status: Abnormal   Collection Time: 11/24/15  8:40 AM  Result Value Ref Range Status   MRSA, PCR NEGATIVE NEGATIVE Final   Staphylococcus aureus POSITIVE (A) NEGATIVE Final    Comment:        The Xpert SA Assay (FDA approved for NASAL specimens in patients over 45 years of age), is one component of a comprehensive surveillance program.  Test performance has been validated by [facility] for patients greater than or equal to 29 year old. It is not intended to diagnose infection nor  to guide or monitor treatment.       Radiology Studies: Pelvis Portable  Result Date: 11/26/2015 CLINICAL DATA:  82 year old female status post right hip replacement EXAM: PORTABLE PELVIS 1-2 VIEWS COMPARISON:  Preoperative radiographs obtained 11/23/2015 FINDINGS: Interval surgical changes of right total hip arthroplasty. A single cerclage wire is present surrounding the proximal femoral metaphysis. No evidence of immediate hardware complication. Extensive subcutaneous emphysema consistent with recent surgical intervention. The bones appear osteopenic. IMPRESSION: Status post right hip arthroplasty with expected postsurgical changes. Electronically Signed   By: Jacqulynn Cadet M.D.   On: 11/26/2015 21:03   Dg C-arm 61-120 Min-no Report  Result Date: 11/26/2015 CLINICAL DATA: right anterior hemi hip C-ARM 61-120 MINUTES Fluoroscopy was utilized by the requesting physician.  No radiographic interpretation.   Dg Hip Operative Unilat W Or W/o Pelvis Right  Result Date: 11/26/2015 CLINICAL DATA:  82 year old female undergoing right hip arthroplasty EXAM: DG C-ARM 61-120 MIN-NO REPORT; OPERATIVE RIGHT HIP WITH PELVIS COMPARISON:  Preoperative radiographs 11/23/2015 FINDINGS: Intraoperative spot image demonstrates surgical changes of right hip arthroplasty with cerclage wire about the proximal femoral metaphysis. No evidence of immediate hardware complication. IMPRESSION: Intraoperative radiographs as above. Electronically Signed   By: Jacqulynn Cadet M.D.   On: 11/26/2015 20:39     Scheduled Meds: . apixaban  2.5 mg Oral BID  . atorvastatin  80 mg Oral Daily  . cefUROXime  250 mg Oral BID WC  . feeding supplement (ENSURE ENLIVE)  237 mL Oral BID BM  . furosemide  20 mg Oral Daily  . gabapentin  100 mg Oral BID  . insulin aspart  0-9 Units Subcutaneous TID WC  . losartan  50 mg Oral Daily  . metoprolol  75 mg Oral BID  . pantoprazole  40 mg Oral BID  . trimethoprim  100 mg Oral QHS    Continuous Infusions:    LOS: 4 days   Time Spent in minutes   30 minutes  Malachi Kinzler D.O. on 11/27/2015 at 11:39 AM  Between 7am to 7pm - Pager - [phone]  After 7pm go to [email or web address] - password [email or web address]  And look for the night coverage person covering for me after  hours  [employer]  [phone]

## 2015-11-27 NOTE — Progress Notes (Signed)
OT Cancellation Note  Patient Details Name: Caroline Aguirre MRN: [id number] DOB: [birth year]   Cancelled Treatment:    Noted pt from a SNF and returing to SNF.  Will defer to OT eval to SNF. Kari Baars, [place] Payton Mccallum D 11/27/2015, 12:32 PM

## 2015-11-27 NOTE — Care Management Important Message (Signed)
Important Message  Patient Details  Name: Caroline Aguirre MRN: [id number] Date of Birth: 1925-03-18   Medicare Important Message Given:  Yes    [name] 11/27/2015, 10:29 AM

## 2015-11-27 NOTE — Evaluation (Signed)
Clinical/Bedside Swallow Evaluation Patient Details  Name: Caroline Aguirre MRN: [id number] Date of Birth: Dec 07, 1924  Today's Date: 11/27/2015 Time: SLP Start Time (ACUTE ONLY): 1110 SLP Stop Time (ACUTE ONLY): 1130 SLP Time Calculation (min) (ACUTE ONLY): 20 min  Past Medical History:  Past Medical History:  Diagnosis Date  . Arthritis   . Cancer ([place])    Rt Breast  . Coronary artery disease   . Cyst of breast    Right - at 61 yrs of age.  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 Diabetes mellitus without complication ([place])   . Diverticulitis   . Elevated cholesterol   . GERD (gastroesophageal reflux disease)   . Hardening of the arteries of the brain   . Heart disease   . Herniated disc   . Hx Breast cancer, DCIS, Right 08/19/2010  . Hypertension   . Migraine headache   . SBO (small bowel obstruction) ([place]) 09/02/11  . Shingles   . Stroke [facility]) 2004   cerebral hemmorhage   Past Surgical History:  Past Surgical History:  Procedure Laterality Date  . BRAIN SURGERY     hemorrhage  . BREAST SURGERY  09/25/2010   mastectomy  . CYSTECTOMY    . ESOPHAGOGASTRODUODENOSCOPY N/A 11/13/2015   Procedure: ESOPHAGOGASTRODUODENOSCOPY (EGD);  Surgeon: Rogene Houston, MD;  Location: [facility] ENDO SUITE;  Service: Endoscopy;  Laterality: N/A;  . fibroid tumors    . KNEE ARTHROSCOPY Right    right knee x 3  . LAPAROSCOPIC BILATERAL SALPINGO OOPHERECTOMY    . SPINE SURGERY     repair herniated disc  . TOTAL HIP ARTHROPLASTY Right 11/26/2015   Procedure: HEMI HIP ARTHROPLASTY ANTERIOR APPROACH;  Surgeon: Rod Can, MD;  Location: [facility] ORS;  Service: Orthopedics;  Laterality: Right;   HPI:  83 yo female adm to [facility] after fall resulting in hip fx s/p right hip sx.  PMH + for dementia, recent GI bleed requiring endoscopy, AVR.  Pt resided independently prior to last GI bleed per family, she transferred to SNF after GI bleed admit.    Swallow evaluation  ordered.  Pt has either been lethargic or agitated during SLP attempts for evaluation.       Assessment / Plan / Recommendation Clinical Impression  Pt presents with surprisingly functional swallow.  She was able to self feed *very impulsively* with timely swallow, no oral residuals and clear voice throughout. Pt was eager to eat and consumed all of her ice cream, a few crackers and water.  Pt's cousin Diane in room and was educated to aspiration precautions/dysphagia compensations including having pt self feed.  Recommend regular thin diet, medicine with ice cream.  NO SLP follow up indicated.     Aspiration Risk       Diet Recommendation Regular;Thin liquid   Liquid Administration via: Cup;Straw Medication Administration: Whole meds with puree Supervision: Patient able to self feed;Intermittent supervision to cue for compensatory strategies Compensations: Minimize environmental distractions;Slow rate;Small sips/bites Postural Changes: Seated upright at 90 degrees;Remain upright for at least 30 minutes after po intake    Other  Recommendations Oral Care Recommendations: Oral care BID   Follow up Recommendations  None    Frequency and Duration            Prognosis        Swallow Study   General Date of Onset: 11/27/15 HPI: 82 yo female adm to [facility] after fall resulting in  hip fx s/p right hip sx.  PMH + for dementia, recent GI bleed requiring endoscopy, AVR.  Pt resided independently prior to last GI bleed per family, she transferred to SNF after GI bleed admit.    Swallow evaluation ordered.  Pt has either been lethargic or agitated during SLP attempts for evaluation.     Type of Study: Bedside Swallow Evaluation Diet Prior to this Study: NPO Temperature Spikes Noted: No Respiratory Status: Nasal cannula History of Recent Intubation: No Behavior/Cognition: Alert;Confused;Agitated;Uncooperative;Doesn't follow directions Oral Cavity Assessment: Within Functional Limits Oral Care  Completed by SLP: No Oral Cavity - Dentition: Adequate natural dentition Vision: Functional for self-feeding Self-Feeding Abilities: Able to feed self;Needs set up Patient Positioning: Upright in bed Baseline Vocal Quality: Normal Volitional Cough: Cognitively unable to elicit Volitional Swallow: Unable to elicit    Oral/Motor/Sensory Function Overall Oral Motor/Sensory Function: Generalized oral weakness   Ice Chips Ice chips: Not tested   Thin Liquid Thin Liquid: Within functional limits Presentation: Self Fed;Straw    Nectar Thick Nectar Thick Liquid: Not tested   Honey Thick Honey Thick Liquid: Not tested   Puree Puree: Within functional limits Presentation: Self Fed;Spoon   Solid   GO   Solid: Within functional limits Presentation: [place], [place], [facility] SLP [phone]

## 2015-11-27 NOTE — Evaluation (Signed)
Physical Therapy Evaluation Patient Details Name: Caroline Aguirre MRN: [id number] DOB: 1924/08/31 Today's Date: 11/27/2015   History of Present Illness  82 y.o. female with medical history significant of HTN, prior CVA, DCIS, DM, Migraine, prior SBO, who sustained a mechanical fall at the SNF and sustained a fractured R hip. S/P direct anterior Hip hemiarthroplasty.  Clinical Impression  The patient  Resisted with mobility to sitting on the edge of the bed. Unable to attempt transfer due to poor balance and pushing /leaning posture. Pt admitted with above diagnosis. Pt currently with functional limitations due to the deficits listed below (see PT Problem List).  Pt will benefit from skilled PT to increase their independence and safety with mobility to allow discharge to the venue listed below.   Per EPIC notes, the [patient was ambulating with mod assist 1 week  Prior to DC to SNF.      Follow Up Recommendations SNF;Supervision/Assistance - 24 hour    Equipment Recommendations  None recommended by PT    Recommendations for Other Services       Precautions / Restrictions Precautions Precautions: Fall Precaution Comments: due to AMS, and general debility,  monitor High HR in 150's Restrictions RLE Weight Bearing: Weight bearing as tolerated      Mobility  Bed Mobility Overal bed mobility: Needs Assistance Bed Mobility: Supine to Sit;Sit to Supine     Supine to sit: Total assist;+2 for physical assistance;HOB elevated;+2 for safety/equipment Sit to supine: Total assist;+2 for physical assistance;+2 for safety/equipment;HOB elevated   General bed mobility comments: used bed pad to support and turn patient on the bed and assisted trunk to upright.Ppatient sat x 2 minutes with 2 total assist., HR 151. RN informed. returned to supine with bed pad use and total assist.  Transfers                    Ambulation/Gait                Stairs            Wheelchair  Mobility    Modified Rankin (Stroke Patients Only)       Balance Overall balance assessment: Needs assistance;History of Falls Sitting-balance support: Feet supported;Bilateral upper extremity supported Sitting balance-Leahy Scale: Zero   Postural control: Right lateral lean;Posterior lean                                   Pertinent Vitals/Pain Pain Assessment: Faces Faces Pain Scale: Hurts little more Pain Location: ? hip with movement Pain Descriptors / Indicators: Guarding;Crying Pain Intervention(s): Limited activity within patient's tolerance;Monitored during session    Home Living Family/patient expects to be discharged to:: Skilled nursing facility                      Prior Function     Gait / Transfers Assistance Needed: per EPIC notes 1 week ago, patient was ambulating with mod assistance and poor balance x 80'           Hand Dominance        Extremity/Trunk Assessment   Upper Extremity Assessment: Generalized weakness           Lower Extremity Assessment: RLE deficits/detail RLE Deficits / Details: Leg is positioned in external rotation. Does not move either leg.    Cervical / Trunk Assessment: Kyphotic  Communication      Cognition  Arousal/Alertness: Awake/alert Behavior During Therapy: Agitated;Restless Overall Cognitive Status: Impaired/Different from baseline  Per family, patient was more able to communicate prior to DC to SNF.                 General Comments: patient does not follow caommands, does not make eye conatact.    General Comments      Exercises        Assessment/Plan    PT Assessment Patient needs continued PT services  PT Diagnosis Difficulty walking;Altered mental status;Acute pain   PT Problem List Decreased strength;Decreased range of motion;Decreased activity tolerance;Decreased balance;Decreased safety awareness;Decreased knowledge of use of DME;Decreased cognition  PT Treatment  Interventions Functional mobility training;Therapeutic activities;Therapeutic exercise;Patient/family education   PT Goals (Current goals can be found in the Care Plan section) Acute Rehab PT Goals Patient Stated Goal: per family, to return to being able to ambulate PT Goal Formulation: With family Time For Goal Achievement: 12/11/15 Potential to Achieve Goals: Fair    Frequency Min 2X/week   Barriers to discharge        Co-evaluation               End of Session Equipment Utilized During Treatment: Oxygen Activity Tolerance: Treatment limited secondary to agitation;Treatment limited secondary to medical complications (Comment) (HR 151) Patient left: in bed;with call bell/phone within reach;with bed alarm set;with family/visitor present Nurse Communication: Mobility status;Need for lift equipment         Time: 1208-1235 PT Time Calculation (min) (ACUTE ONLY): 27 min   Charges:   PT Evaluation $PT Eval Moderate Complexity: 1 Procedure PT Treatments $Therapeutic Activity: 8-22 mins   PT G Codes:        Marcelino Freestone PT [id number]  11/27/2015, 1:23 PM

## 2015-11-27 NOTE — Care Management Note (Signed)
Case Management Note  Patient Details  Name: ASHIAH LOCICERO MRN: [id number] Date of Birth: 07-04-1924  Subjective/Objective:  Patient from SNF-[facility]. R femoral neck fx. POD#1 R Hip Hemi.Bundled patient per Leonides Grills. D/c plan return back to SNF-CSW following.                  Action/Plan:d/c SNF.   Expected Discharge Date:                  Expected Discharge Plan:  Skilled Nursing Facility  In-House Referral:  Clinical Social Work  Discharge planning Services  CM Consult  Post Acute Care Choice:    Choice offered to:     DME Arranged:    DME Agency:     HH Arranged:    [place] Agency:     Status of Service:  In process, will continue to follow  If discussed at Long Length of Stay Meetings, dates discussed:    Additional Comments:  Dessa Phi, RN 11/27/2015, 11:44 AM

## 2015-11-27 NOTE — Anesthesia Postprocedure Evaluation (Signed)
Anesthesia Post Note  Patient: Caroline Aguirre  Procedure(s) Performed: Procedure(s) (LRB): HEMI HIP ARTHROPLASTY ANTERIOR APPROACH (Right)  Patient location during evaluation: PACU Anesthesia Type: General Level of consciousness: awake and alert Pain management: pain level controlled Vital Signs Assessment: post-procedure vital signs reviewed and stable Respiratory status: spontaneous breathing, nonlabored ventilation, respiratory function stable and patient connected to nasal cannula oxygen Cardiovascular status: blood pressure returned to baseline and stable Postop Assessment: no signs of nausea or vomiting Anesthetic complications: no    Last Vitals:  Vitals:   11/26/15 2130 11/26/15 2145  BP: 134/69 122/87  Pulse: 92 (!) 116  Resp: 18 17  Temp: 36.4 C 36.4 C    Last Pain:  Vitals:   11/27/15 0531  TempSrc:   PainSc: 9                  Emonii Wienke A

## 2015-11-28 ENCOUNTER — Ambulatory Visit: Payer: Medicare Other | Admitting: Urology

## 2015-11-28 DIAGNOSIS — H409 Unspecified glaucoma: Secondary | ICD-10-CM | POA: Diagnosis not present

## 2015-11-28 DIAGNOSIS — R339 Retention of urine, unspecified: Secondary | ICD-10-CM | POA: Diagnosis not present

## 2015-11-28 DIAGNOSIS — R259 Unspecified abnormal involuntary movements: Secondary | ICD-10-CM | POA: Diagnosis not present

## 2015-11-28 DIAGNOSIS — R488 Other symbolic dysfunctions: Secondary | ICD-10-CM | POA: Diagnosis not present

## 2015-11-28 DIAGNOSIS — S72091D Other fracture of head and neck of right femur, subsequent encounter for closed fracture with routine healing: Secondary | ICD-10-CM | POA: Diagnosis not present

## 2015-11-28 DIAGNOSIS — Z471 Aftercare following joint replacement surgery: Secondary | ICD-10-CM | POA: Diagnosis not present

## 2015-11-28 DIAGNOSIS — S72001A Fracture of unspecified part of neck of right femur, initial encounter for closed fracture: Secondary | ICD-10-CM | POA: Diagnosis not present

## 2015-11-28 DIAGNOSIS — I2510 Atherosclerotic heart disease of native coronary artery without angina pectoris: Secondary | ICD-10-CM | POA: Diagnosis not present

## 2015-11-28 DIAGNOSIS — G9340 Encephalopathy, unspecified: Secondary | ICD-10-CM | POA: Diagnosis not present

## 2015-11-28 DIAGNOSIS — S72009A Fracture of unspecified part of neck of unspecified femur, initial encounter for closed fracture: Secondary | ICD-10-CM | POA: Diagnosis not present

## 2015-11-28 DIAGNOSIS — F482 Pseudobulbar affect: Secondary | ICD-10-CM | POA: Diagnosis not present

## 2015-11-28 DIAGNOSIS — Z9181 History of falling: Secondary | ICD-10-CM | POA: Diagnosis not present

## 2015-11-28 DIAGNOSIS — F0391 Unspecified dementia with behavioral disturbance: Secondary | ICD-10-CM | POA: Diagnosis not present

## 2015-11-28 DIAGNOSIS — I10 Essential (primary) hypertension: Secondary | ICD-10-CM | POA: Diagnosis not present

## 2015-11-28 DIAGNOSIS — N390 Urinary tract infection, site not specified: Secondary | ICD-10-CM | POA: Diagnosis not present

## 2015-11-28 DIAGNOSIS — F419 Anxiety disorder, unspecified: Secondary | ICD-10-CM | POA: Diagnosis not present

## 2015-11-28 DIAGNOSIS — M6281 Muscle weakness (generalized): Secondary | ICD-10-CM | POA: Diagnosis not present

## 2015-11-28 DIAGNOSIS — R293 Abnormal posture: Secondary | ICD-10-CM | POA: Diagnosis not present

## 2015-11-28 DIAGNOSIS — E43 Unspecified severe protein-calorie malnutrition: Secondary | ICD-10-CM | POA: Diagnosis not present

## 2015-11-28 DIAGNOSIS — G4700 Insomnia, unspecified: Secondary | ICD-10-CM | POA: Diagnosis not present

## 2015-11-28 DIAGNOSIS — F0390 Unspecified dementia without behavioral disturbance: Secondary | ICD-10-CM | POA: Diagnosis not present

## 2015-11-28 DIAGNOSIS — R262 Difficulty in walking, not elsewhere classified: Secondary | ICD-10-CM | POA: Diagnosis not present

## 2015-11-28 DIAGNOSIS — E119 Type 2 diabetes mellitus without complications: Secondary | ICD-10-CM | POA: Diagnosis not present

## 2015-11-28 DIAGNOSIS — S72041D Displaced fracture of base of neck of right femur, subsequent encounter for closed fracture with routine healing: Secondary | ICD-10-CM | POA: Diagnosis not present

## 2015-11-28 DIAGNOSIS — I482 Chronic atrial fibrillation: Secondary | ICD-10-CM | POA: Diagnosis not present

## 2015-11-28 DIAGNOSIS — R278 Other lack of coordination: Secondary | ICD-10-CM | POA: Diagnosis not present

## 2015-11-28 DIAGNOSIS — K219 Gastro-esophageal reflux disease without esophagitis: Secondary | ICD-10-CM | POA: Diagnosis not present

## 2015-11-28 DIAGNOSIS — K922 Gastrointestinal hemorrhage, unspecified: Secondary | ICD-10-CM | POA: Diagnosis not present

## 2015-11-28 LAB — CBC
HEMATOCRIT: 34 % — AB (ref 36.0–46.0)
HEMOGLOBIN: 10.9 g/dL — AB (ref 12.0–15.0)
MCH: 27.6 pg (ref 26.0–34.0)
MCHC: 32.1 g/dL (ref 30.0–36.0)
MCV: 86.1 fL (ref 78.0–100.0)
Platelets: 414 10*3/uL — ABNORMAL HIGH (ref 150–400)
RBC: 3.95 MIL/uL (ref 3.87–5.11)
RDW: 14.3 % (ref 11.5–15.5)
WBC: 16.3 10*3/uL — AB (ref 4.0–10.5)

## 2015-11-28 LAB — BASIC METABOLIC PANEL
ANION GAP: 9 (ref 5–15)
BUN: 56 mg/dL — ABNORMAL HIGH (ref 6–20)
CO2: 28 mmol/L (ref 22–32)
Calcium: 8.8 mg/dL — ABNORMAL LOW (ref 8.9–10.3)
Chloride: 101 mmol/L (ref 101–111)
Creatinine, Ser: 1.12 mg/dL — ABNORMAL HIGH (ref 0.44–1.00)
GFR calc Af Amer: 49 mL/min — ABNORMAL LOW (ref >60)
GFR, EST NON AFRICAN AMERICAN: 42 mL/min — AB (ref >60)
GLUCOSE: 151 mg/dL — AB (ref 65–99)
POTASSIUM: 4.7 mmol/L (ref 3.5–5.1)
Sodium: 138 mmol/L (ref 135–145)

## 2015-11-28 LAB — GLUCOSE, CAPILLARY
GLUCOSE-CAPILLARY: 164 mg/dL — AB (ref 65–99)
Glucose-Capillary: 144 mg/dL — ABNORMAL HIGH (ref 65–99)

## 2015-11-28 MED ORDER — OXYCODONE HCL 5 MG PO TABS
5.0000 mg | ORAL_TABLET | Freq: Four times a day (QID) | ORAL | 0 refills | Status: DC | PRN
Start: 2015-11-28 — End: 2016-11-29

## 2015-11-28 MED ORDER — CEFUROXIME AXETIL 250 MG PO TABS: 250.0000 mg | ORAL_TABLET | Freq: Two times a day (BID) | ORAL | 0 refills | Status: DC

## 2015-11-28 MED ORDER — METOPROLOL TARTRATE 75 MG PO TABS: 75.0000 mg | ORAL_TABLET | Freq: Two times a day (BID) | ORAL | 0 refills | Status: AC

## 2015-11-28 MED ORDER — BISACODYL 10 MG RE SUPP
10.0000 mg | Freq: Once | RECTAL | Status: AC
  Administered 2015-11-28: 10 mg via RECTAL
  Filled 2015-11-28: qty 1

## 2015-11-28 MED ORDER — TRAMADOL HCL 50 MG PO TABS: 50.0000 mg | ORAL_TABLET | Freq: Four times a day (QID) | ORAL | 0 refills | Status: DC | PRN

## 2015-11-28 MED ORDER — ENSURE ENLIVE PO LIQD: 237.0000 mL | Freq: Two times a day (BID) | ORAL | 12 refills | Status: DC

## 2015-11-28 MED ORDER — POLYETHYLENE GLYCOL 3350 17 G PO PACK: 17.0000 g | PACK | Freq: Every day | ORAL | 0 refills | Status: DC | PRN

## 2015-11-28 MED ORDER — LORAZEPAM 0.5 MG PO TABS: 0.5000 mg | ORAL_TABLET | Freq: Three times a day (TID) | ORAL | 0 refills | Status: DC | PRN

## 2015-11-28 NOTE — Care Management Note (Signed)
Case Management Note  Patient Details  Name: TAHARI CALI MRN: [id number] Date of Birth: December 05, 1924  Subjective/Objective:                    Action/Plan:d/c snf.   Expected Discharge Date:                  Expected Discharge Plan:  Skilled Nursing Facility  In-House Referral:  Clinical Social Work  Discharge planning Services  CM Consult  Post Acute Care Choice:    Choice offered to:     DME Arranged:    DME Agency:     HH Arranged:    [place] Agency:     Status of Service:  Completed, signed off  If discussed at [employer], dates discussed:    Additional Comments:  Dessa Phi, RN 11/28/2015, 11:29 AM

## 2015-11-28 NOTE — Progress Notes (Signed)
Report called to nurse Inez Catalina at [facility]. All questions answered. FC D/D'd as ordered- no complications. No BM charted since admission- MD aware. Dulcolax supp administered as ordered. [facility] aware. Patient dressed in paper gown, resting comfortably at this time. Awaiting transport. Will continue to monitor.

## 2015-11-28 NOTE — Discharge Summary (Signed)
Physician Discharge Summary  Caroline Aguirre [id number] DOB: 1924/05/12 DOA: 11/23/2015  PCP: Alonza Bogus, MD  Admit date: 11/23/2015 Discharge date: 11/28/2015  Time spent: 45 minutes  Recommendations for Outpatient Follow-up:  Patient will be discharged to [facility].  Continue physical and occupational therapy. Patient will need to follow up with primary care provider within one week of discharge, repeat BMP/CBC.  Follow up with Dr. Lyla Glassing in 1-2 weeks. Patient should continue medications as prescribed.  Patient should follow a heart healthy/carb modified diet.   Discharge Diagnoses:  Closed right hip fracture Atrial fibrillation with RVR UTI Dementia/ Acute encephalopathy Coronary artery disease Diabetes mellitus, type II Leukocytosis Chronic kidney disease, stage III  Discharge Condition: Stable  Diet recommendation: Heart healthy/carb modified  Filed Weights   11/23/15 1032  Weight: 54.4 kg (120 lb)    History of present illness:  on 11/23/2015 by Dr. Murray Hodgkins 82 year old woman with dementia, coronary artery disease, recent admission for GI bleed, presented to the emergency department after a fall onto her right side at the nursing home. Imaging revealed right hip fracture. Family requested transfer to [facility] for orthopedic care. Orthopedics will see in consultation, requested transfer to [facility] long.  Patient has dementia. Her healthcare power of attorney/second cousin [name] is at bedside and reports that since EGD patient has been confused. Patient cannot provide any history. Patient has been confused at the skilled nursing facility. Per [name] patient has had multiple falls at the skilled nursing facility since discharge. Patient fell this morning and she was notified. She is not aware of any other particular medical problems or acute issues at this point. [facility] called by no answer.  Hospital Course:  Closed right hip fracture -s/p  fall -Orthopedics consulted and appreciated, s/p Right hip hemiarthroplasty -Continue supportive care, pain control -PT/OT consulted, recommended SNF  Atrial fibrillation with RVR -Currently rate controlled -Was initially placed on lovenox, Eliquis was held for surgery -Continue metoprolol (increased dose) and Eliquis -CHADSVASC 7 (h/o stroke 2004, DM, HTN, gender, age)  UTI -Initially placed on rocephin-transitioned to Ceftin -Urine culture: >100K staphylococcus aureus  Dementia/ Acute encephalopathy -Likely complicated by pain, UTI, hip fracture -Currently has hand mitts in place  Coronary artery disease -continue statin, losartan, metoprolol  Diabetes mellitus, type II -Currently diet controlled  Leukocytosis -Likely secondary to recent surgery vs UTI -Currently afebrile -Continue antibiotics for UTI -Repeat CBC in one week  Chronic kidney disease, stage III -GFR remains in stage III.  Creatinine 1.12 -Upon reviewing records, patient has been in stage III since 2014 -Repeat BMP periodically  Code status: DNR  Consultants Orthopedic surgery  Procedures  Right hip hemiarthroplasty  Discharge Exam: Vitals:   11/28/15 0534 11/28/15 0734  BP: 121/69   Pulse: 96 (!) 101  Resp: 20   Temp: 97.9 F (36.6 C)     Exam  General: Well developed,elderly, NAD  HEENT: NCAT, mucous membranes moist.   Cardiovascular: S1 S2 auscultated, irregular  Respiratory: Clear to auscultation bilaterally with equal chest rise  Abdomen: Soft, nontender, nondistended, + bowel sounds  Extremities: warm dry without cyanosis clubbing or edema  Neuro:Awake, cannot assess orientation.  Does not respond to questions or commands. Constantly yells.  Psych: cannot assess due to current mental state  Discharge Instructions Discharge Instructions    Discharge instructions    Complete by:  As directed   Patient will be discharged to [facility]  Continue physical and  occupational therapy. Patient will need to follow up  with primary care provider within one week of discharge, repeat BMP/CBC.  Folow up with Dr. Lyla Glassing in 1-2 weeks. Patient should continue medications as prescribed.  Patient should follow a heart healthy/carb modified diet.     Current Discharge Medication List    START taking these medications   Details  cefUROXime (CEFTIN) 250 MG tablet Take 1 tablet (250 mg total) by mouth 2 (two) times daily with a meal. Qty: 6 tablet, Refills: 0    feeding supplement, ENSURE ENLIVE, (ENSURE ENLIVE) LIQD Take 237 mLs by mouth 2 (two) times daily between meals. Qty: 237 mL, Refills: 12    oxyCODONE (OXY IR/ROXICODONE) 5 MG immediate release tablet Take 1 tablet (5 mg total) by mouth every 6 (six) hours as needed (moderate to severe pain). Qty: 10 tablet, Refills: 0    polyethylene glycol (MIRALAX / GLYCOLAX) packet Take 17 g by mouth daily as needed for mild constipation. Qty: 14 each, Refills: 0    traMADol (ULTRAM) 50 MG tablet Take 1 tablet (50 mg total) by mouth every 6 (six) hours as needed for moderate pain (or Headache unrelieved by tylenol). Qty: 10 tablet, Refills: 0      CONTINUE these medications which have CHANGED   Details  LORazepam (ATIVAN) 0.5 MG tablet Take 1 tablet (0.5 mg total) by mouth every 8 (eight) hours as needed for anxiety. Qty: 30 tablet, Refills: 0    metoprolol 75 MG TABS Take 75 mg by mouth 2 (two) times daily. Qty: 60 tablet, Refills: 0      CONTINUE these medications which have NOT CHANGED   Details  acetaminophen (TYLENOL) 325 MG tablet Take 325 mg by mouth daily as needed for moderate pain.    apixaban (ELIQUIS) 2.5 MG TABS tablet Take 1 tablet (2.5 mg total) by mouth 2 (two) times daily. Qty: 60 tablet, Refills: 0    atorvastatin (LIPITOR) 80 MG tablet Take 1 tablet (80 mg total) by mouth every morning. Qty: 30 tablet, Refills: 12    calcium carbonate (TUMS EX) 750 MG chewable tablet Chew 2 tablets  by mouth daily.     furosemide (LASIX) 20 MG tablet Take 1 tablet (20 mg total) by mouth daily. Qty: 90 tablet, Refills: 3    gabapentin (NEURONTIN) 100 MG capsule Take 1 capsule (100 mg total) by mouth 2 (two) times daily.    levalbuterol (XOPENEX) 0.63 MG/3ML nebulizer solution Take 3 mLs (0.63 mg total) by nebulization every 6 (six) hours as needed for wheezing or shortness of breath. Qty: 3 mL, Refills: 12    losartan (COZAAR) 50 MG tablet Take 1 tablet (50 mg total) by mouth daily. Qty: 90 tablet, Refills: 3    Methylcellulose, Laxative, (CITRUCEL) 500 MG TABS Take 1 tablet by mouth daily.     Multiple Vitamin (MULTIVITAMIN WITH MINERALS) TABS Take 1 tablet by mouth every morning. *Contains NO Iron (Ferrous Sulfate and/or Iodine)*    oxybutynin (DITROPAN) 5 MG tablet Take 0.5 tablets (2.5 mg total) by mouth 3 (three) times daily.    pantoprazole (PROTONIX) 40 MG tablet Take 1 tablet (40 mg total) by mouth 2 (two) times daily.    trimethoprim (TRIMPEX) 100 MG tablet Take 100 mg by mouth at bedtime.    vitamin C (ASCORBIC ACID) 500 MG tablet Take 500 mg by mouth every morning.        Allergies  Allergen Reactions  . Tape Rash  . Ciprofloxacin   . Sulfa Antibiotics Hives  . Codeine Nausea Only  nausea.  . Morphine And Related Nausea Only    Nausea only.   Follow-up Information    HAWKINS,EDWARD L, MD. Schedule an appointment as soon as possible for a visit in 1 week(s).   Specialty:  Pulmonary Disease Why:  Hosptial follow up, repeat CBC and BMP in one week Contact information: [address] [place] [address] [phone]        Swinteck, Horald Pollen, MD. Schedule an appointment as soon as possible for a visit in 2 week(s).   Specialty:  Orthopedic Surgery Why:  Hospital follow up Contact information: [address] [place] [address] [phone]            The results of significant diagnostics from this  hospitalization (including imaging, microbiology, ancillary and laboratory) are listed below for reference.    Significant Diagnostic Studies: Dg Chest 1 View  Result Date: 11/23/2015 CLINICAL DATA:  Status post fall today with right hip pain EXAM: CHEST 1 VIEW COMPARISON:  September 03, 2015 FINDINGS: The heart size and mediastinal contours are stable. The heart size is enlarged. There is consolidation of bilateral lung bases with bilateral pleural effusions. There is mild interstitial edema. The bones are stable. IMPRESSION: Mild congestive heart failure. Patchy consolidation of bilateral lung bases with bilateral pleural effusions, underlying pneumonia is not excluded. Electronically Signed   By: Abelardo Diesel M.D.   On: 11/23/2015 11:40   Ct Head Wo Contrast  Result Date: 11/23/2015 CLINICAL DATA:  82 year old female with a history of fall. Right hip pain EXAM: CT HEAD WITHOUT CONTRAST CT CERVICAL SPINE WITHOUT CONTRAST TECHNIQUE: Multidetector CT imaging of the head and cervical spine was performed following the standard protocol without intravenous contrast. Multiplanar CT image reconstructions of the cervical spine were also generated. COMPARISON:  MR brain 03/18/2015, head CT 03/16/2015 FINDINGS: CT HEAD FINDINGS Unremarkable appearance of the calvarium without acute fracture or aggressive lesion. Unremarkable appearance of the scalp soft tissues. Unremarkable appearance of the bilateral orbits. Mastoid air cells are clear. No significant paranasal sinus disease Small calcified meningioma along the inner table of the left temporal bone, unchanged. No local mass effect. No acute intracranial hemorrhage, midline shift, or mass effect. Unchanged configuration of the ventricles. Gray-white differentiation maintained. Confluent hypodensity in the periventricular white matter, most pronounced in the posterior right parietal and occipital lobe. Hyperdense cortex of the right occipital region unchanged from the  comparison. No local mass effect. Dense intracranial atherosclerosis. CT CERVICAL SPINE FINDINGS Craniocervical junction aligned. No acute fracture at the skullbase identified. Anatomic alignment of the cervical elements is relatively maintained. No subluxation. Vertebral body heights maintained. No fracture line identified. Alignment of the facets maintain. Multilevel degenerative changes throughout the cervical spine with disc space narrowing, uncovertebral joint disease, and posterior disc osteophyte complexes. No significant bony canal narrowing. Uncovertebral joint disease and facet disease contributes to varying degrees of bilateral neural foraminal narrowing. No evidence of epidural hemorrhage. Right thyroid cyst. Irregular parenchyma of the bilateral thyroid. Calcifications of the carotid vasculature. Bilateral pleural effusions layer dependently at the lung apices. IMPRESSION: Head CT: No CT evidence of acute intracranial abnormality. Similar changes of chronic white matter disease and mineralization of cortex in the right parieto-occipital region. Intracranial atherosclerosis. Cervical spine CT: No CT evidence of acute fracture or malalignment of the cervical spine. Multilevel degenerative disc disease, uncovertebral joint disease, as well as facet changes contribute to varying degrees of bilateral neural foraminal narrowing of the cervical spine. Bilateral pleural effusions  layered at the lung apices. Nodular and cystic changes of the thyroid. If clinically warranted, further evaluation with ultrasound may be considered. Signed, Dulcy Fanny. Earleen Newport, DO [facility] Radiology Electronically Signed   By: Corrie Mckusick D.O.   On: 11/23/2015 12:13   Ct Cervical Spine Wo Contrast  Result Date: 11/23/2015 CLINICAL DATA:  82 year old female with a history of fall. Right hip pain EXAM: CT HEAD WITHOUT CONTRAST CT CERVICAL SPINE WITHOUT CONTRAST TECHNIQUE:  Multidetector CT imaging of the head and cervical spine was performed following the standard protocol without intravenous contrast. Multiplanar CT image reconstructions of the cervical spine were also generated. COMPARISON:  MR brain 03/18/2015, head CT 03/16/2015 FINDINGS: CT HEAD FINDINGS Unremarkable appearance of the calvarium without acute fracture or aggressive lesion. Unremarkable appearance of the scalp soft tissues. Unremarkable appearance of the bilateral orbits. Mastoid air cells are clear. No significant paranasal sinus disease Small calcified meningioma along the inner table of the left temporal bone, unchanged. No local mass effect. No acute intracranial hemorrhage, midline shift, or mass effect. Unchanged configuration of the ventricles. Gray-white differentiation maintained. Confluent hypodensity in the periventricular white matter, most pronounced in the posterior right parietal and occipital lobe. Hyperdense cortex of the right occipital region unchanged from the comparison. No local mass effect. Dense intracranial atherosclerosis. CT CERVICAL SPINE FINDINGS Craniocervical junction aligned. No acute fracture at the skullbase identified. Anatomic alignment of the cervical elements is relatively maintained. No subluxation. Vertebral body heights maintained. No fracture line identified. Alignment of the facets maintain. Multilevel degenerative changes throughout the cervical spine with disc space narrowing, uncovertebral joint disease, and posterior disc osteophyte complexes. No significant bony canal narrowing. Uncovertebral joint disease and facet disease contributes to varying degrees of bilateral neural foraminal narrowing. No evidence of epidural hemorrhage. Right thyroid cyst. Irregular parenchyma of the bilateral thyroid. Calcifications of the carotid vasculature. Bilateral pleural effusions layer dependently at the lung apices. IMPRESSION: Head CT: No CT evidence of acute intracranial  abnormality. Similar changes of chronic white matter disease and mineralization of cortex in the right parieto-occipital region. Intracranial atherosclerosis. Cervical spine CT: No CT evidence of acute fracture or malalignment of the cervical spine. Multilevel degenerative disc disease, uncovertebral joint disease, as well as facet changes contribute to varying degrees of bilateral neural foraminal narrowing of the cervical spine. Bilateral pleural effusions layered at the lung apices. Nodular and cystic changes of the thyroid. If clinically warranted, further evaluation with ultrasound may be considered. Signed, Dulcy Fanny. Earleen Newport, DO [facility] Radiology Electronically Signed   By: Corrie Mckusick D.O.   On: 11/23/2015 12:13   Pelvis Portable  Result Date: 11/26/2015 CLINICAL DATA:  82 year old female status post right hip replacement EXAM: PORTABLE PELVIS 1-2 VIEWS COMPARISON:  Preoperative radiographs obtained 11/23/2015 FINDINGS: Interval surgical changes of right total hip arthroplasty. A single cerclage wire is present surrounding the proximal femoral metaphysis. No evidence of immediate hardware complication. Extensive subcutaneous emphysema consistent with recent surgical intervention. The bones appear osteopenic. IMPRESSION: Status post right hip arthroplasty with expected postsurgical changes. Electronically Signed   By: Jacqulynn Cadet M.D.   On: 11/26/2015 21:03   Dg C-arm 61-120 Min-no Report  Result Date: 11/26/2015 CLINICAL DATA: right anterior hemi hip C-ARM 61-120 MINUTES Fluoroscopy was utilized by the requesting physician.  No radiographic interpretation.   Dg Hip Operative Unilat W Or W/o Pelvis Right  Result Date: 11/26/2015 CLINICAL DATA:  82 year old female undergoing right hip arthroplasty EXAM:  DG C-ARM 61-120 MIN-NO REPORT; OPERATIVE RIGHT HIP WITH PELVIS COMPARISON:  Preoperative radiographs 11/23/2015 FINDINGS: Intraoperative spot  image demonstrates surgical changes of right hip arthroplasty with cerclage wire about the proximal femoral metaphysis. No evidence of immediate hardware complication. IMPRESSION: Intraoperative radiographs as above. Electronically Signed   By: Jacqulynn Cadet M.D.   On: 11/26/2015 20:39   Dg Hip Unilat  With Pelvis 2-3 Views Right  Result Date: 11/23/2015 CLINICAL DATA:  Status post fall today with right hip pain EXAM: DG HIP (WITH OR WITHOUT PELVIS) 2-3V RIGHT COMPARISON:  None. FINDINGS: There is displaced fracture at the base of the right femoral neck. There is no dislocation. IMPRESSION: Fracture at the base of the right femoral neck. Electronically Signed   By: Abelardo Diesel M.D.   On: 11/23/2015 11:39    Microbiology: Recent Results (from the past 240 hour(s))  Culture, Urine     Status: Abnormal   Collection Time: 11/23/15  4:02 PM  Result Value Ref Range Status   Specimen Description URINE, RANDOM  Final   Special Requests NONE  Final   Culture >=100,000 COLONIES/mL STAPHYLOCOCCUS AUREUS (A)  Final   Report Status 11/26/2015 FINAL  Final   Organism ID, Bacteria STAPHYLOCOCCUS AUREUS (A)  Final      Susceptibility   Staphylococcus aureus - MIC*    CIPROFLOXACIN >=8 RESISTANT Resistant     GENTAMICIN <=0.5 SENSITIVE Sensitive     NITROFURANTOIN <=16 SENSITIVE Sensitive     OXACILLIN 0.5 SENSITIVE Sensitive     TETRACYCLINE <=1 SENSITIVE Sensitive     VANCOMYCIN <=0.5 SENSITIVE Sensitive     TRIMETH/SULFA <=10 SENSITIVE Sensitive     CLINDAMYCIN <=0.25 SENSITIVE Sensitive     RIFAMPIN <=0.5 SENSITIVE Sensitive     Inducible Clindamycin NEGATIVE Sensitive     * >=100,000 COLONIES/mL STAPHYLOCOCCUS AUREUS  Surgical PCR screen     Status: Abnormal   Collection Time: 11/24/15  8:40 AM  Result Value Ref Range Status   MRSA, PCR NEGATIVE NEGATIVE Final   Staphylococcus aureus POSITIVE (A) NEGATIVE Final    Comment:        The Xpert SA Assay (FDA approved for NASAL specimens in  patients over 44 years of age), is one component of a comprehensive surveillance program.  Test performance has been validated by [facility] for patients greater than or equal to 61 year old. It is not intended to diagnose infection nor to guide or monitor treatment.      Labs: Basic Metabolic Panel:  Recent Labs Lab 11/23/15 1113 11/24/15 0439 11/27/15 0524 11/28/15 0547  NA 136 141 140 138  K 4.4 4.4 4.4 4.7  CL 101 105 103 101  CO2 25 26 27 28   GLUCOSE 185* 175* 161* 151*  BUN 15 17 36* 56*  CREATININE 0.84 1.01* 0.97 1.12*  CALCIUM 8.5* 8.9 8.6* 8.8*   Liver Function Tests:  Recent Labs Lab 11/23/15 1113  AST 41  ALT 56*  ALKPHOS 95  BILITOT 1.2  PROT 5.9*  ALBUMIN 3.2*   No results for input(s): LIPASE, AMYLASE in the last 168 hours. No results for input(s): AMMONIA in the last 168 hours. CBC:  Recent Labs Lab 11/23/15 1113 11/24/15 0439 11/27/15 0524 11/28/15 0547  WBC 15.4* 17.1* 13.6* 16.3*  NEUTROABS 13.1*  --   --   --   HGB 11.7* 11.7* 11.8* 10.9*  HCT 35.9* 35.8* 35.5* 34.0*  MCV 86.5 84.4 83.9 86.1  PLT 381 355 318 414*  Cardiac Enzymes: No results for input(s): CKTOTAL, CKMB, CKMBINDEX, TROPONINI in the last 168 hours. BNP: BNP (last 3 results)  Recent Labs  09/03/15 0827  BNP 312.0*    ProBNP (last 3 results) No results for input(s): PROBNP in the last 8760 hours.  CBG:  Recent Labs Lab 11/27/15 0804 11/27/15 1151 11/27/15 1647 11/27/15 2258 11/28/15 0750  GLUCAP 170* 152* 185* 171* 144*       Signed:  Judith Campillo  [facility] 11/28/2015, 10:37 AM

## 2015-11-28 NOTE — Discharge Instructions (Signed)

## 2015-11-28 NOTE — Progress Notes (Signed)
   Subjective:  Patient reports pain as mild to moderate.    Objective:   VITALS:   Vitals:   11/27/15 2139 11/27/15 2157 11/28/15 0534 11/28/15 0734  BP: (!) 90/50 111/67 121/69   Pulse: (!) 58 (!) 101 96 (!) 101  Resp: 16  20   Temp: 97.5 F (36.4 C)  97.9 F (36.6 C)   TempSrc: Axillary  Oral   SpO2: 97%  94%   Weight:      Height:        NAD, confused ABD soft Intact pulses distally Dorsiflexion/Plantar flexion intact Incision: dressing C/D/I Compartment soft   Lab Results  Component Value Date   WBC 16.3 (H) 11/28/2015   HGB 10.9 (L) 11/28/2015   HCT 34.0 (L) 11/28/2015   MCV 86.1 11/28/2015   PLT 414 (H) 11/28/2015   BMET    Component Value Date/Time   NA 138 11/28/2015 0547   K 4.7 11/28/2015 0547   CL 101 11/28/2015 0547   CO2 28 11/28/2015 0547   GLUCOSE 151 (H) 11/28/2015 0547   BUN 56 (H) 11/28/2015 0547   CREATININE 1.12 (H) 11/28/2015 0547   CREATININE 1.27 (H) 09/04/2015 1413   CALCIUM 8.8 (L) 11/28/2015 0547   GFRNONAA 42 (L) 11/28/2015 0547   GFRAA 49 (L) 11/28/2015 0547     Assessment/Plan: 2 Days Post-Op   Principal Problem:   Closed right hip fracture (HCC) Active Problems:   Hearing loss   Acute encephalopathy   DM type 2 (diabetes mellitus, type 2) (HCC)   Coronary artery disease   Atrial fibrillation (HCC)   Femoral neck fracture, right, closed, initial encounter   WBAT with walker DVT ppx: chronic Eliquis PO pain control PT/OT Dispo: most appropriate dispo likely d/c to memory care with PT 3x/week, currently not a candidate for skilled services given severe confusion; please discuss d/c planning with Leonides Grills, RN case manager with [facility]   Kimra Kantor, Horald Pollen 11/28/2015, 7:48 AM   Rod Can, MD Cell [phone]

## 2015-11-28 NOTE — Consult Note (Signed)
   [facility] CM Inpatient Consult   11/28/2015  Caroline Aguirre Sep 23, 1924 [id number]    Patient screened for potential [facility] Care Management services. Chart reviewed. Noted discharge plan is for  SNF.  There are no identifiable [facility] Care Management needs at this time. Confirmed with inpatient Licensed CSW and inpatient RNCM.  Marthenia Rolling, MSN-Ed, RN,BSN [facility] Liaison [phone]

## 2015-11-28 NOTE — NC FL2 (Signed)
[place] LEVEL OF CARE SCREENING TOOL     IDENTIFICATION  Patient Name: Caroline Aguirre Birthdate: 09-Nov-1924 Sex: female Admission Date (Current Location): 11/23/2015  [facility] and [place] Number:  Engineer, manufacturing systems and Address:  [facility],  [place] [address] [place]      Provider Number: [id number]  Attending Physician Name and Address:  Cristal Ford, DO  Relative Name and Phone Number:       Current Level of Care: Hospital Recommended Level of Care: [place] Prior Approval Number:    Date Approved/Denied:   PASRR Number: [id number]  Discharge Plan: SNF    Current Diagnoses: Patient Active Problem List   Diagnosis Date Noted  . Femoral neck fracture, right, closed, initial encounter 11/26/2015  . Closed right hip fracture ([place]) 11/23/2015  . Atrial fibrillation ([place]) 11/17/2015  . Personal history of stroke with current residual effects 11/17/2015  . Protein-calorie malnutrition, severe ([place]) 11/17/2015  . Upper GI bleed 11/12/2015  . Acute encephalopathy 03/16/2015  . DM type 2 (diabetes mellitus, type 2) ([place]) 03/16/2015  . Urinary retention 03/16/2015  . Coronary artery disease 03/16/2015  . Essential hypertension 03/16/2015  . Sinus bradycardia 03/16/2015  . Vulvar irritation 07/17/2013  . SBO (small bowel obstruction) ([employer]) 09/03/2011  . Cyst of breast, right, solitary 09/07/2010  . Fatigue/loss of sleep 09/07/2010  . High blood pressure 09/07/2010  . Bronchitis 09/07/2010  . Skin moles (abnormal) 09/07/2010  . Gastroesophageal reflux disease with hiatal hernia 09/07/2010  . Hearing loss 09/07/2010  . Wears glasses and/or contacts 09/07/2010  . Glaucoma 09/07/2010  . Menopause 09/07/2010  . Herniated disc   . Shingles   . Hardening of the arteries of the brain   . Stroke ([place])   . Hx Breast cancer, DCIS, Right 08/19/2010    Orientation RESPIRATION BLADDER Height & Weight     Self,  Place  Normal Incontinent Weight: 120 lb (54.4 kg) Height:  5\' 3"  (160 cm)  BEHAVIORAL SYMPTOMS/MOOD NEUROLOGICAL BOWEL NUTRITION STATUS      Incontinent Diet (Heart Healthy/Carb Modified)  AMBULATORY STATUS COMMUNICATION OF NEEDS Skin   Total Care Verbally Normal                       Personal Care Assistance Level of Assistance  Bathing, Feeding, Dressing, Total care Bathing Assistance: Maximum assistance Feeding assistance: Maximum assistance Dressing Assistance: Maximum assistance Total Care Assistance: Maximum assistance   Functional Limitations Info    Sight Info: Adequate Hearing Info: Impaired Speech Info: Adequate    SPECIAL CARE FACTORS FREQUENCY  PT (By licensed PT)                    Contractures      Additional Factors Info  Psychotropic, Code Status, Allergies Code Status Info: Fullcode Allergies Info: Tape, Ciprofloxacin, Sulfa Antibiotics, Codeine, Morphine And Related           Current Medications (11/28/2015):  This is the current hospital active medication list Current Facility-Administered Medications  Medication Dose Route Frequency Provider Last Rate Last Dose  . acetaminophen (TYLENOL) tablet 650 mg  650 mg Oral Q6H PRN Rod Can, MD       Or  . acetaminophen (TYLENOL) suppository 650 mg  650 mg Rectal Q6H PRN Rod Can, MD      . apixaban Arne Cleveland) tablet 2.5 mg  2.5 mg Oral BID Rod Can, MD   2.5 mg at 11/28/15  [id number]  . atorvastatin (LIPITOR) tablet 80 mg  80 mg Oral Daily Samuella Cota, MD   80 mg at 11/28/15 0954  . cefUROXime (CEFTIN) tablet 250 mg  250 mg Oral BID WC Maryann Mikhail, DO   250 mg at 11/28/15 [id number]  . feeding supplement (ENSURE ENLIVE) (ENSURE ENLIVE) liquid 237 mL  237 mL Oral BID BM Velvet Bathe, MD   237 mL at 11/28/15 0951  . furosemide (LASIX) tablet 20 mg  20 mg Oral Daily Samuella Cota, MD   20 mg at 11/28/15 0955  . gabapentin (NEURONTIN) capsule 100 mg  100 mg Oral BID Samuella Cota, MD   100 mg at 11/28/15 0954  . insulin aspart (novoLOG) injection 0-9 Units  0-9 Units Subcutaneous TID WC Samuella Cota, MD   2 Units at 11/28/15 1213  . levalbuterol (XOPENEX) nebulizer solution 0.63 mg  0.63 mg Nebulization Q6H PRN Samuella Cota, MD      . losartan (COZAAR) tablet 50 mg  50 mg Oral Daily Samuella Cota, MD   50 mg at 11/28/15 0956  . menthol-cetylpyridinium (CEPACOL) lozenge 3 mg  1 lozenge Oral PRN Rod Can, MD       Or  . phenol (CHLORASEPTIC) mouth spray 1 spray  1 spray Mouth/Throat PRN Rod Can, MD      . metoCLOPramide (REGLAN) tablet 5-10 mg  5-10 mg Oral Q8H PRN Rod Can, MD       Or  . metoCLOPramide (REGLAN) injection 5-10 mg  5-10 mg Intravenous Q8H PRN Rod Can, MD      . metoprolol tartrate (LOPRESSOR) tablet 75 mg  75 mg Oral BID Velvet Bathe, MD   75 mg at 11/28/15 0955  . morphine 2 MG/ML injection 2 mg  2 mg Intravenous Q4H PRN Gardiner Barefoot, NP   2 mg at 11/28/15 1142  . ondansetron (ZOFRAN) tablet 4 mg  4 mg Oral Q6H PRN Rod Can, MD       Or  . ondansetron [facility]) injection 4 mg  4 mg Intravenous Q6H PRN Rod Can, MD      . oxyCODONE (Oxy IR/ROXICODONE) immediate release tablet 5 mg  5 mg Oral Q6H PRN Gardiner Barefoot, NP      . pantoprazole (PROTONIX) EC tablet 40 mg  40 mg Oral BID Samuella Cota, MD   40 mg at 11/28/15 0955  . polyethylene glycol (MIRALAX / GLYCOLAX) packet 17 g  17 g Oral Daily PRN Samuella Cota, MD      . traMADol Veatrice Bourbon) tablet 50 mg  50 mg Oral Q6H PRN Gardiner Barefoot, NP      . trimethoprim (TRIMPEX) tablet 100 mg  100 mg Oral QHS Samuella Cota, MD   100 mg at 11/27/15 2158     Discharge Medications: Please see discharge summary for a list of discharge medications.  Relevant Imaging Results:  Relevant Lab Results:   Additional Information SSN: [id number]  Standley Brooking, LCSW

## 2015-11-28 NOTE — Progress Notes (Signed)
Patient is set to discharge to [facility] SNF today. Patient & Caroline Aguirre made aware. Discharge packet given to RN, Marolyn Hammock. [facility] called for transport.     Raynaldo Opitz, [facility] Clinical Social Worker cell #: [phone]

## 2015-12-04 ENCOUNTER — Inpatient Hospital Stay
Admission: RE | Admit: 2015-12-04 | Discharge: 2016-07-30 | Disposition: A | Discharge status: Other Acute Inpatient Hospital | Payer: Medicare Other | Source: Ambulatory Visit | Attending: Pulmonary Disease | Admitting: Pulmonary Disease

## 2015-12-05 ENCOUNTER — Encounter (HOSPITAL_COMMUNITY)
Admission: RE | Admit: 2015-12-05 | Discharge: 2015-12-05 | Disposition: A | Discharge status: Home / Self Care | Payer: Medicare Other | Source: Skilled Nursing Facility | Attending: Pulmonary Disease | Admitting: Pulmonary Disease

## 2015-12-05 LAB — BASIC METABOLIC PANEL
Anion gap: 9 (ref 5–15)
BUN: 28 mg/dL — AB (ref 6–20)
CALCIUM: 8.6 mg/dL — AB (ref 8.9–10.3)
CO2: 27 mmol/L (ref 22–32)
Chloride: 99 mmol/L — ABNORMAL LOW (ref 101–111)
Creatinine, Ser: 0.77 mg/dL (ref 0.44–1.00)
GFR calc Af Amer: >60 mL/min (ref >60)
GLUCOSE: 103 mg/dL — AB (ref 65–99)
Potassium: 4.1 mmol/L (ref 3.5–5.1)
Sodium: 135 mmol/L (ref 135–145)

## 2015-12-05 LAB — CBC
HEMATOCRIT: 39.7 % (ref 36.0–46.0)
Hemoglobin: 12.4 g/dL (ref 12.0–15.0)
MCH: 26.8 pg (ref 26.0–34.0)
MCHC: 31.2 g/dL (ref 30.0–36.0)
MCV: 85.9 fL (ref 78.0–100.0)
PLATELETS: 426 10*3/uL — AB (ref 150–400)
RBC: 4.62 MIL/uL (ref 3.87–5.11)
RDW: 14.4 % (ref 11.5–15.5)
WBC: 16.1 10*3/uL — AB (ref 4.0–10.5)

## 2015-12-07 DIAGNOSIS — K922 Gastrointestinal hemorrhage, unspecified: Secondary | ICD-10-CM | POA: Diagnosis not present

## 2015-12-09 NOTE — H&P (Signed)
Caroline Aguirre, BODENHAMER              ACCOUNT NO.:  [id number]  MEDICAL RECORD NO.:  [id number]  LOCATION:  LAB                           FACILITY:  [facility]  PHYSICIAN:  Lania Zawistowski L. Luan Pulling, M.D.DATE OF BIRTH:  24-Sep-1924  DATE OF ADMISSION:  12/05/2015 DATE OF DISCHARGE:  09/15/2017LH                             HISTORY & PHYSICAL   The patient is in the [facility] after hip fracture.  HISTORY OF PRESENT ILLNESS:  This is an 82 year old, who has history of atrial fibrillation with rapid ventricular response, urinary tract infection, acute encephalopathy, coronary artery occlusive disease, chronic kidney disease stage 3, previous history of stroke, and previous history of breast cancer.  She had been admitted with GI bleeding about a month ago.  She was sent to the skilled care facility for rehabilitation after that admission, but fell and had a right hip fracture.  She was transferred to [facility] and had open reduction and internal fixation.  She is still confused.  She is participating with physical therapy.  She says she is afraid of falling.  PAST MEDICAL HISTORY:  Positive for previous stroke, coronary artery disease, breast cancer, probably some mild dementia and she has had acute encephalopathy.  She has chronic kidney disease.  She has had atrial fibrillation with rapid ventricular response.  SOCIAL HISTORY:  She is a recent admission to the nursing home.  She does not smoke.  She does not drink any alcohol.  She had previously lived with her son.  FAMILY HISTORY:  Positive for diabetes and hypertension.  REVIEW OF SYSTEMS:  Except as mentioned is pretty much unobtainable because she is still confused.  PHYSICAL EXAMINATION:  GENERAL:  She is a thin female, who is in no acute distress. HEENT:  Her pupils are reactive.  Nose and throat are clear.  Mucous membranes are moist.  She does appear to be in atrial fibrillation. NECK:  Supple without masses. CHEST:  Clear  without wheezes, rales, or rhonchi. HEART:  Irregularly irregular without gallop. ABDOMEN:  Soft.  No masses are felt. EXTREMITIES:  No edema.  She is still very stiff in the right hip area. CENTRAL NERVOUS SYSTEM:  She is confused, but grossly intact.  She is moving all 4 extremities.  ASSESSMENT:  She has had a hip fracture.  She is undergoing physical therapy and rehabilitation.  The patient at the [facility], documentation of my history and physical done on December 07, 2015, at the [place].     Makayela Secrest L. Luan Pulling, M.D.     ELH/MEDQ  D:  12/08/2015  T:  12/08/2015  Job:  [id number]

## 2015-12-10 DIAGNOSIS — S72091D Other fracture of head and neck of right femur, subsequent encounter for closed fracture with routine healing: Secondary | ICD-10-CM | POA: Diagnosis not present

## 2015-12-15 ENCOUNTER — Encounter (HOSPITAL_COMMUNITY)
Admission: RE | Admit: 2015-12-15 | Discharge: 2015-12-15 | Disposition: A | Discharge status: Home / Self Care | Payer: Medicare Other | Source: Skilled Nursing Facility | Attending: Pulmonary Disease | Admitting: Pulmonary Disease

## 2015-12-15 LAB — URINALYSIS, ROUTINE W REFLEX MICROSCOPIC
Bilirubin Urine: NEGATIVE
GLUCOSE, UA: NEGATIVE mg/dL
Ketones, ur: NEGATIVE mg/dL
Nitrite: NEGATIVE
PH: 7 (ref 5.0–8.0)
Protein, ur: NEGATIVE mg/dL
Specific Gravity, Urine: <1.005 — ABNORMAL LOW (ref 1.005–1.030)

## 2015-12-15 LAB — URINE MICROSCOPIC-ADD ON

## 2015-12-17 LAB — URINE CULTURE

## 2015-12-29 DIAGNOSIS — K922 Gastrointestinal hemorrhage, unspecified: Secondary | ICD-10-CM | POA: Diagnosis not present

## 2015-12-29 NOTE — Progress Notes (Signed)
I went to see her at the nursing home. She has a Actuary at bedside and is sleeping. She is still having episodes of confusion. I don't think it's clear if she is going to be able to go back home yet or not.  She sleeping but arousable.   She is confused. Pupils react. Nose and throat are clear. Her heart is irregular. Abdomen is soft  She has had a hip fracture. She has multiple other medical problems and now is having significant issues with confusion. I think she may have baseline mild dementia and has gotten worse with her acute illness.  Continue rehabilitation. I don't think there is anything to add at this point.

## 2016-01-07 DIAGNOSIS — G4700 Insomnia, unspecified: Secondary | ICD-10-CM | POA: Diagnosis not present

## 2016-01-07 DIAGNOSIS — F419 Anxiety disorder, unspecified: Secondary | ICD-10-CM | POA: Diagnosis not present

## 2016-01-07 DIAGNOSIS — F0391 Unspecified dementia with behavioral disturbance: Secondary | ICD-10-CM | POA: Diagnosis not present

## 2016-01-07 DIAGNOSIS — F482 Pseudobulbar affect: Secondary | ICD-10-CM | POA: Diagnosis not present

## 2016-01-09 ENCOUNTER — Encounter (HOSPITAL_COMMUNITY)
Admission: RE | Admit: 2016-01-09 | Discharge: 2016-01-09 | Disposition: A | Discharge status: Home / Self Care | Payer: Medicare Other | Source: Skilled Nursing Facility | Attending: Pulmonary Disease | Admitting: Pulmonary Disease

## 2016-01-09 DIAGNOSIS — N390 Urinary tract infection, site not specified: Secondary | ICD-10-CM | POA: Diagnosis not present

## 2016-01-09 LAB — CBC
HEMATOCRIT: 36.5 % (ref 36.0–46.0)
HEMOGLOBIN: 11.5 g/dL — AB (ref 12.0–15.0)
MCH: 26.1 pg (ref 26.0–34.0)
MCHC: 31.5 g/dL (ref 30.0–36.0)
MCV: 83 fL (ref 78.0–100.0)
Platelets: 300 10*3/uL (ref 150–400)
RBC: 4.4 MIL/uL (ref 3.87–5.11)
RDW: 15.5 % (ref 11.5–15.5)
WBC: 11.1 10*3/uL — AB (ref 4.0–10.5)

## 2016-01-09 LAB — COMPREHENSIVE METABOLIC PANEL
ALBUMIN: 2.9 g/dL — AB (ref 3.5–5.0)
ALT: 25 U/L (ref 14–54)
ANION GAP: 7 (ref 5–15)
AST: 27 U/L (ref 15–41)
Alkaline Phosphatase: 160 U/L — ABNORMAL HIGH (ref 38–126)
BUN: 27 mg/dL — ABNORMAL HIGH (ref 6–20)
CHLORIDE: 105 mmol/L (ref 101–111)
CO2: 26 mmol/L (ref 22–32)
Calcium: 8.6 mg/dL — ABNORMAL LOW (ref 8.9–10.3)
Creatinine, Ser: 0.78 mg/dL (ref 0.44–1.00)
GFR calc non Af Amer: >60 mL/min (ref >60)
GLUCOSE: 112 mg/dL — AB (ref 65–99)
Potassium: 3.7 mmol/L (ref 3.5–5.1)
SODIUM: 138 mmol/L (ref 135–145)
Total Bilirubin: 1.1 mg/dL (ref 0.3–1.2)
Total Protein: 5.7 g/dL — ABNORMAL LOW (ref 6.5–8.1)

## 2016-01-09 LAB — TSH

## 2016-01-13 DIAGNOSIS — S72091D Other fracture of head and neck of right femur, subsequent encounter for closed fracture with routine healing: Secondary | ICD-10-CM | POA: Diagnosis not present

## 2016-01-14 ENCOUNTER — Encounter (HOSPITAL_COMMUNITY)
Admission: RE | Admit: 2016-01-14 | Discharge: 2016-01-14 | Disposition: A | Discharge status: Home / Self Care | Payer: Medicare Other | Source: Skilled Nursing Facility | Attending: Pulmonary Disease | Admitting: Pulmonary Disease

## 2016-01-14 DIAGNOSIS — N390 Urinary tract infection, site not specified: Secondary | ICD-10-CM | POA: Diagnosis not present

## 2016-01-14 LAB — URINALYSIS, ROUTINE W REFLEX MICROSCOPIC
Bilirubin Urine: NEGATIVE
GLUCOSE, UA: NEGATIVE mg/dL
KETONES UR: NEGATIVE mg/dL
NITRITE: NEGATIVE
PH: 6 (ref 5.0–8.0)
Protein, ur: 30 mg/dL — AB
SPECIFIC GRAVITY, URINE: 1.02 (ref 1.005–1.030)

## 2016-01-14 LAB — URINE MICROSCOPIC-ADD ON: Squamous Epithelial / LPF: NONE SEEN

## 2016-01-16 LAB — URINE CULTURE

## 2016-01-28 DIAGNOSIS — F419 Anxiety disorder, unspecified: Secondary | ICD-10-CM | POA: Diagnosis not present

## 2016-01-28 DIAGNOSIS — G4700 Insomnia, unspecified: Secondary | ICD-10-CM | POA: Diagnosis not present

## 2016-01-28 DIAGNOSIS — F482 Pseudobulbar affect: Secondary | ICD-10-CM | POA: Diagnosis not present

## 2016-01-28 DIAGNOSIS — R0989 Other specified symptoms and signs involving the circulatory and respiratory systems: Secondary | ICD-10-CM | POA: Diagnosis not present

## 2016-01-28 DIAGNOSIS — F0391 Unspecified dementia with behavioral disturbance: Secondary | ICD-10-CM | POA: Diagnosis not present

## 2016-01-28 DIAGNOSIS — R05 Cough: Secondary | ICD-10-CM | POA: Diagnosis not present

## 2016-02-01 DIAGNOSIS — K922 Gastrointestinal hemorrhage, unspecified: Secondary | ICD-10-CM | POA: Diagnosis not present

## 2016-02-02 DIAGNOSIS — S72041D Displaced fracture of base of neck of right femur, subsequent encounter for closed fracture with routine healing: Secondary | ICD-10-CM | POA: Diagnosis not present

## 2016-02-03 DIAGNOSIS — S72041D Displaced fracture of base of neck of right femur, subsequent encounter for closed fracture with routine healing: Secondary | ICD-10-CM | POA: Diagnosis not present

## 2016-02-04 DIAGNOSIS — S72041D Displaced fracture of base of neck of right femur, subsequent encounter for closed fracture with routine healing: Secondary | ICD-10-CM | POA: Diagnosis not present

## 2016-02-05 DIAGNOSIS — S72041D Displaced fracture of base of neck of right femur, subsequent encounter for closed fracture with routine healing: Secondary | ICD-10-CM | POA: Diagnosis not present

## 2016-02-08 NOTE — Progress Notes (Signed)
Subjective: This is documentation of my visit to the skilled care facility on 02/01/2016. She was admitted to the skilled care facility after hip fracture. She has multiple other medical problems including atrial fibrillation with rapid ventricular response multiple UTIs dementia acute on chronic encephalopathy coronary artery occlusive disease and previous breast cancer.  Objective: Vital signs in last 24 hours:   Weight change:     Intake/Output from previous day: No intake/output data recorded.  PHYSICAL EXAM General appearance: alert and Confused mildly agitated. Very thin Resp: alert and Normal respiratory effort. Lungs show rhonchi Cardio: She is in atrial fib with well-controlled ventricular response and with systolic heart murmur no gallop GI: soft, non-tender; bowel sounds normal; no masses,  no organomegaly Extremities: Hip fracture scar well-healed Pupils reactive. EOMI. Skin warm and dry but thin. Mucous membranes are moist  Lab Results:  No results found for this or any previous visit (from the past 48 hour(s)).  ABGS No results for input(s): PHART, PO2ART, TCO2, HCO3 in the last 72 hours.  Invalid input(s): PCO2 CULTURES No results found for this or any previous visit (from the past 240 hour(s)). Studies/Results: No results found.  Medications:  Prior to Admission:  Prescriptions Prior to Admission  Medication Sig Dispense Refill Last Dose  . acetaminophen (TYLENOL) 325 MG tablet Take 325 mg by mouth daily as needed for moderate pain.   unknown  . apixaban (ELIQUIS) 2.5 MG TABS tablet Take 1 tablet (2.5 mg total) by mouth 2 (two) times daily. 60 tablet 0 11/23/2015 at 0800  . atorvastatin (LIPITOR) 80 MG tablet Take 1 tablet (80 mg total) by mouth every morning. 30 tablet 12 11/23/2015 at Unknown time  . calcium carbonate (TUMS EX) 750 MG chewable tablet Chew 2 tablets by mouth daily.    11/23/2015 at Unknown time  . cefUROXime (CEFTIN) 250 MG tablet Take 1 tablet  (250 mg total) by mouth 2 (two) times daily with a meal. 6 tablet 0   . feeding supplement, ENSURE ENLIVE, (ENSURE ENLIVE) LIQD Take 237 mLs by mouth 2 (two) times daily between meals. 237 mL 12   . furosemide (LASIX) 20 MG tablet Take 1 tablet (20 mg total) by mouth daily. 90 tablet 3 11/23/2015 at Unknown time  . gabapentin (NEURONTIN) 100 MG capsule Take 1 capsule (100 mg total) by mouth 2 (two) times daily.   11/23/2015 at Unknown time  . levalbuterol (XOPENEX) 0.63 MG/3ML nebulizer solution Take 3 mLs (0.63 mg total) by nebulization every 6 (six) hours as needed for wheezing or shortness of breath. 3 mL 12 Past Week at Unknown time  . LORazepam (ATIVAN) 0.5 MG tablet Take 1 tablet (0.5 mg total) by mouth every 8 (eight) hours as needed for anxiety. 30 tablet 0   . losartan (COZAAR) 50 MG tablet Take 1 tablet (50 mg total) by mouth daily. 90 tablet 3 11/23/2015 at Unknown time  . Methylcellulose, Laxative, (CITRUCEL) 500 MG TABS Take 1 tablet by mouth daily.    11/23/2015 at Unknown time  . metoprolol 75 MG TABS Take 75 mg by mouth 2 (two) times daily. 60 tablet 0   . Multiple Vitamin (MULTIVITAMIN WITH MINERALS) TABS Take 1 tablet by mouth every morning. *Contains NO Iron (Ferrous Sulfate and/or Iodine)*   11/23/2015 at Unknown time  . oxybutynin (DITROPAN) 5 MG tablet Take 0.5 tablets (2.5 mg total) by mouth 3 (three) times daily.   11/23/2015 at Unknown time  . oxyCODONE (OXY IR/ROXICODONE) 5 MG immediate release tablet  Take 1 tablet (5 mg total) by mouth every 6 (six) hours as needed (moderate to severe pain). 10 tablet 0   . pantoprazole (PROTONIX) 40 MG tablet Take 1 tablet (40 mg total) by mouth 2 (two) times daily.   11/23/2015 at Unknown time  . polyethylene glycol (MIRALAX / GLYCOLAX) packet Take 17 g by mouth daily as needed for mild constipation. 14 each 0   . traMADol (ULTRAM) 50 MG tablet Take 1 tablet (50 mg total) by mouth every 6 (six) hours as needed for moderate pain (or Headache unrelieved by  tylenol). 10 tablet 0   . trimethoprim (TRIMPEX) 100 MG tablet Take 100 mg by mouth at bedtime.   11/22/2015 at Unknown time  . vitamin C (ASCORBIC ACID) 500 MG tablet Take 500 mg by mouth every morning.    11/23/2015 at Unknown time   Scheduled: Continuous: PRN:  Assesment:She was admitted to rehabilitation to try to get her improved from hip fracture. Her rehabilitation has been significantly impacted by her dementia. She's not eating well and has lost weight she is on supplements and on appetite stimulants. Active Problems:   * No active hospital problems. *    Plan: Continue efforts at rehabilitation. I'm concerned that she will not be able to return home    LOS: 66 days   Shaheen Star L 02/08/2016, 8:57 AM

## 2016-02-09 DIAGNOSIS — S72041D Displaced fracture of base of neck of right femur, subsequent encounter for closed fracture with routine healing: Secondary | ICD-10-CM | POA: Diagnosis not present

## 2016-02-10 DIAGNOSIS — S72041D Displaced fracture of base of neck of right femur, subsequent encounter for closed fracture with routine healing: Secondary | ICD-10-CM | POA: Diagnosis not present

## 2016-02-11 DIAGNOSIS — S72041D Displaced fracture of base of neck of right femur, subsequent encounter for closed fracture with routine healing: Secondary | ICD-10-CM | POA: Diagnosis not present

## 2016-02-16 DIAGNOSIS — G4700 Insomnia, unspecified: Secondary | ICD-10-CM | POA: Diagnosis not present

## 2016-02-16 DIAGNOSIS — S72041D Displaced fracture of base of neck of right femur, subsequent encounter for closed fracture with routine healing: Secondary | ICD-10-CM | POA: Diagnosis not present

## 2016-02-16 DIAGNOSIS — F29 Unspecified psychosis not due to a substance or known physiological condition: Secondary | ICD-10-CM | POA: Diagnosis not present

## 2016-02-16 DIAGNOSIS — F0391 Unspecified dementia with behavioral disturbance: Secondary | ICD-10-CM | POA: Diagnosis not present

## 2016-02-16 DIAGNOSIS — F419 Anxiety disorder, unspecified: Secondary | ICD-10-CM | POA: Diagnosis not present

## 2016-02-17 DIAGNOSIS — S72041D Displaced fracture of base of neck of right femur, subsequent encounter for closed fracture with routine healing: Secondary | ICD-10-CM | POA: Diagnosis not present

## 2016-02-18 DIAGNOSIS — S72041D Displaced fracture of base of neck of right femur, subsequent encounter for closed fracture with routine healing: Secondary | ICD-10-CM | POA: Diagnosis not present

## 2016-02-20 DIAGNOSIS — S72041D Displaced fracture of base of neck of right femur, subsequent encounter for closed fracture with routine healing: Secondary | ICD-10-CM | POA: Diagnosis not present

## 2016-02-22 DIAGNOSIS — K922 Gastrointestinal hemorrhage, unspecified: Secondary | ICD-10-CM | POA: Diagnosis not present

## 2016-03-01 DIAGNOSIS — F419 Anxiety disorder, unspecified: Secondary | ICD-10-CM | POA: Diagnosis not present

## 2016-03-01 DIAGNOSIS — F0391 Unspecified dementia with behavioral disturbance: Secondary | ICD-10-CM | POA: Diagnosis not present

## 2016-03-01 DIAGNOSIS — F29 Unspecified psychosis not due to a substance or known physiological condition: Secondary | ICD-10-CM | POA: Diagnosis not present

## 2016-03-01 DIAGNOSIS — G4700 Insomnia, unspecified: Secondary | ICD-10-CM | POA: Diagnosis not present

## 2016-03-04 NOTE — Progress Notes (Signed)
This is redictation of a note from 02/22/2016 that I had done to the telephone call in system after my visit with Caroline Aguirre of 02/22/2016. When I arrived she was in her room at the nursing home sleeping quietly wearing oxygen with a sitter at her bedside. Her sitter said that she had been doing pretty well. She was confused. She gets agitated at times. She is in the skilled care facility after having had a hip fracture and is known to have atrial fib with RVR, dementia coronary artery occlusive disease chronic kidney disease stage III previous stroke and history of breast cancer.  I reviewed the MAR. I reviewed vital signs  Exam shows that she is lying quietly in the bed but is arousable and confused when aroused. Mucous membranes are dry. Pupils react. Her chest is clear. Her heart is irregularly irregular with no gallop. She doesn't have any peripheral edema. Her abdomen is soft. She is very confused  Assessment is that she's had a hip fracture and had surgery. She had what appeared to be acute encephalopathy and now seems to have more of a chronic encephalopathy from dementia. She will continue rehabilitation. Her atrial fib is slower now.  No change in medications.

## 2016-03-18 DIAGNOSIS — B351 Tinea unguium: Secondary | ICD-10-CM | POA: Diagnosis not present

## 2016-03-18 DIAGNOSIS — I70203 Unspecified atherosclerosis of native arteries of extremities, bilateral legs: Secondary | ICD-10-CM | POA: Diagnosis not present

## 2016-03-18 DIAGNOSIS — E119 Type 2 diabetes mellitus without complications: Secondary | ICD-10-CM | POA: Diagnosis not present

## 2016-03-18 DIAGNOSIS — M79674 Pain in right toe(s): Secondary | ICD-10-CM | POA: Diagnosis not present

## 2016-03-25 DIAGNOSIS — K922 Gastrointestinal hemorrhage, unspecified: Secondary | ICD-10-CM | POA: Diagnosis not present

## 2016-04-01 NOTE — Progress Notes (Signed)
This is documentation of my visit at the skilled care facility for 03/25/2016. When I arrived in her room she was sleeping with a sitter at bedside. When I aroused her she became very agitated. She later calmed down. She has had a lot of trouble with agitation and we've adjusted her medications. She has dementia at baseline.  I reviewed medications in the [facility]. Noted complaints have been noted by the nursing staff except for her agitation. Additionally she has had a upper respiratory infection which is better now.  She is awake and alert. Agitated and then calmer. Mucous membranes are moist. Heart shows atrial fib with good ventricular response. She is very thin. Her abdomen is soft. No edema of the extremities.  She has had a hip fracture. She's had severe encephalopathy and has baseline dementia. She has chronic atrial fib. All of this is about the same.  Continue current treatments

## 2016-04-05 DIAGNOSIS — F0391 Unspecified dementia with behavioral disturbance: Secondary | ICD-10-CM | POA: Diagnosis not present

## 2016-04-05 DIAGNOSIS — F29 Unspecified psychosis not due to a substance or known physiological condition: Secondary | ICD-10-CM | POA: Diagnosis not present

## 2016-04-05 DIAGNOSIS — F419 Anxiety disorder, unspecified: Secondary | ICD-10-CM | POA: Diagnosis not present

## 2016-04-05 DIAGNOSIS — G4700 Insomnia, unspecified: Secondary | ICD-10-CM | POA: Diagnosis not present

## 2016-04-12 DIAGNOSIS — R0989 Other specified symptoms and signs involving the circulatory and respiratory systems: Secondary | ICD-10-CM | POA: Diagnosis not present

## 2016-04-12 DIAGNOSIS — R062 Wheezing: Secondary | ICD-10-CM | POA: Diagnosis not present

## 2016-04-22 DIAGNOSIS — R05 Cough: Secondary | ICD-10-CM | POA: Diagnosis not present

## 2016-04-22 DIAGNOSIS — R062 Wheezing: Secondary | ICD-10-CM | POA: Diagnosis not present

## 2016-04-23 ENCOUNTER — Encounter (HOSPITAL_COMMUNITY)
Admission: AD | Admit: 2016-04-23 | Discharge: 2016-04-23 | Disposition: A | Discharge status: Home / Self Care | Payer: Medicare Other | Source: Skilled Nursing Facility | Attending: Pulmonary Disease | Admitting: Pulmonary Disease

## 2016-04-23 DIAGNOSIS — J101 Influenza due to other identified influenza virus with other respiratory manifestations: Secondary | ICD-10-CM | POA: Insufficient documentation

## 2016-04-24 LAB — INFLUENZA PANEL BY PCR (TYPE A & B)
Influenza A By PCR: NEGATIVE
Influenza B By PCR: NEGATIVE

## 2016-04-26 DIAGNOSIS — G4700 Insomnia, unspecified: Secondary | ICD-10-CM | POA: Diagnosis not present

## 2016-04-26 DIAGNOSIS — F419 Anxiety disorder, unspecified: Secondary | ICD-10-CM | POA: Diagnosis not present

## 2016-04-26 DIAGNOSIS — F0391 Unspecified dementia with behavioral disturbance: Secondary | ICD-10-CM | POA: Diagnosis not present

## 2016-04-26 DIAGNOSIS — F482 Pseudobulbar affect: Secondary | ICD-10-CM | POA: Diagnosis not present

## 2016-05-03 ENCOUNTER — Encounter (HOSPITAL_COMMUNITY)
Admission: RE | Admit: 2016-05-03 | Discharge: 2016-05-03 | Disposition: A | Discharge status: Home / Self Care | Payer: Medicare Other | Source: Skilled Nursing Facility | Attending: *Deleted | Admitting: *Deleted

## 2016-05-03 DIAGNOSIS — R05 Cough: Secondary | ICD-10-CM | POA: Diagnosis not present

## 2016-05-03 DIAGNOSIS — J101 Influenza due to other identified influenza virus with other respiratory manifestations: Secondary | ICD-10-CM | POA: Diagnosis not present

## 2016-05-03 DIAGNOSIS — R509 Fever, unspecified: Secondary | ICD-10-CM | POA: Diagnosis not present

## 2016-05-03 DIAGNOSIS — R0989 Other specified symptoms and signs involving the circulatory and respiratory systems: Secondary | ICD-10-CM | POA: Diagnosis not present

## 2016-05-03 LAB — INFLUENZA PANEL BY PCR (TYPE A & B)
INFLAPCR: NEGATIVE
INFLBPCR: NEGATIVE

## 2016-05-08 DIAGNOSIS — K922 Gastrointestinal hemorrhage, unspecified: Secondary | ICD-10-CM | POA: Diagnosis not present

## 2016-05-09 NOTE — Progress Notes (Signed)
This is documentation of my visit at the skilled care facility of 05/08/2016. When I entered her room she is resting quietly. She is arousable and confused. She is not agitated now. She has been having cough and congestion there has been an outbreak of influenza at her facility but she was negative. No other new complaints but she is confused.  I reviewed her medications in the [facility]  Physical examination shows that she is confused. She is resting. She is arousable. She is in atrial fib with heart rate about 80. Her chest shows some rhonchi bilaterally. No edema.  She has history of atrial fib coronary disease previous stroke and breast cancer shingles with prolonged neuropathy dementia and now has upper respiratory infection.  No change in treatment plan. I do not think she's going to be able to go home

## 2016-06-12 DIAGNOSIS — N179 Acute kidney failure, unspecified: Secondary | ICD-10-CM | POA: Diagnosis not present

## 2016-06-12 DIAGNOSIS — E441 Mild protein-calorie malnutrition: Secondary | ICD-10-CM | POA: Diagnosis not present

## 2016-06-12 DIAGNOSIS — I6789 Other cerebrovascular disease: Secondary | ICD-10-CM | POA: Diagnosis not present

## 2016-06-12 DIAGNOSIS — F0391 Unspecified dementia with behavioral disturbance: Secondary | ICD-10-CM | POA: Diagnosis not present

## 2016-06-13 NOTE — Progress Notes (Signed)
This is documentation of my visit of 06/12/2016 at the skilled care facility. This is a 83 year old who's had a significant stroke history of atrial fibrillation rapid ventricular response chronic encephalopathy from dementia coronary disease chronic kidney disease stage 3 and previous history of breast cancer. She has been at the skilled care facility for rehabilitation but has had significant issues with behavior. I have had psychiatric consultation and they've added medications but they have not been very effective. She's been very confused. At about 3:00 every afternoon she begins yelling out and it's very difficult to get her to stop. No other complaints noted by the nursing staff she's not really able to provide any history.  I reviewed medications in the [facility]  Physical examination: Constitutional: She is sleeping been arousable not agitated now very thin. Eyes: Pupils react EOMI. Ears nose mouth and throat: Mucous membranes are moist. Her hearing is diminished. Cardiovascular: She is in atrial fibrillation well-controlled ventricular response and with no gallop. Respiratory: Her lungs are clear. Gastrointestinal: Her abdomen is soft with no masses. Neurological: She is very confused  She has multiple medical problems. She has some element of malnutrition. She had acute encephalopathy that has become more chronic. She has dementia. She has coronary disease and that's pretty stable. She had a previous stroke and no evidence of a new stroke. The biggest problem now is behavioral issues which are difficult to control without increasing her risk of another stroke.  Continue treatments. Discuss again with psychiatry next week when they are available

## 2016-06-16 DIAGNOSIS — F29 Unspecified psychosis not due to a substance or known physiological condition: Secondary | ICD-10-CM | POA: Diagnosis not present

## 2016-06-16 DIAGNOSIS — F0391 Unspecified dementia with behavioral disturbance: Secondary | ICD-10-CM | POA: Diagnosis not present

## 2016-06-16 DIAGNOSIS — F39 Unspecified mood [affective] disorder: Secondary | ICD-10-CM | POA: Diagnosis not present

## 2016-06-16 DIAGNOSIS — F419 Anxiety disorder, unspecified: Secondary | ICD-10-CM | POA: Diagnosis not present

## 2016-06-26 DIAGNOSIS — F0391 Unspecified dementia with behavioral disturbance: Secondary | ICD-10-CM | POA: Diagnosis not present

## 2016-06-26 DIAGNOSIS — I482 Chronic atrial fibrillation: Secondary | ICD-10-CM | POA: Diagnosis not present

## 2016-06-26 DIAGNOSIS — N183 Chronic kidney disease, stage 3 (moderate): Secondary | ICD-10-CM | POA: Diagnosis not present

## 2016-06-26 DIAGNOSIS — G459 Transient cerebral ischemic attack, unspecified: Secondary | ICD-10-CM | POA: Diagnosis not present

## 2016-06-27 DIAGNOSIS — R0989 Other specified symptoms and signs involving the circulatory and respiratory systems: Secondary | ICD-10-CM | POA: Diagnosis not present

## 2016-06-27 NOTE — Progress Notes (Signed)
This is documentation of my visit at the skilled care facility of 4/72018 with additional information after a phone call this morning. When I saw her she was sleepy but confused when aroused. She gets agitated very easily. No new complaints. However this morning I received a phone call that she had been vomiting during the night had a little bit of diarrhea and is congested in her chest suggesting that she has aspirated. She would be high risk for aspiration because of her severe dementia. No other complaints. No chest pain and rapid heartbeat abdominal pain but history is very questionable.  I reviewed medications in the [facility]. We are attempting to get her agitation under control and psychiatry is helping with that but she still gets quite agitated.  Physical examination: Constitutional: She is sleepy when I come in but arouses. She is very thin. She is very agitated when she gets awake. Eyes: Pupils react. Ears nose mouth and throat: She's hard of hearing. Mucous membranes are moist. Cardiovascular: She is in atrial fib with ventricular response of about 70. No edema. Respiratory: Her respiratory effort is normal and her lungs are clear. This is as of yesterday not since she had the episode of presumed aspiration. Gastrointestinal: Her abdomen is soft with no masses. Skin: Warm and dry but tents very easily. Neurological: Very confused and agitated  She has multiple severe medical problems. She's had a pretty severe stroke. She has chronic atrial fibrillation. She has coronary artery occlusive disease which seems pretty stable. She has chronic kidney disease stage III. She had a hip fracture. She has dementia and is very agitated. Treatment of this is difficult because of the risk of stroke with antipsychotics. Based on my phone call this morning she may have aspirated so I've ordered chest x-ray ordered her something for nausea and will start her on Augmentin awaiting the results of the chest x-ray.

## 2016-06-30 DIAGNOSIS — G4700 Insomnia, unspecified: Secondary | ICD-10-CM | POA: Diagnosis not present

## 2016-06-30 DIAGNOSIS — F0391 Unspecified dementia with behavioral disturbance: Secondary | ICD-10-CM | POA: Diagnosis not present

## 2016-06-30 DIAGNOSIS — F482 Pseudobulbar affect: Secondary | ICD-10-CM | POA: Diagnosis not present

## 2016-06-30 DIAGNOSIS — F419 Anxiety disorder, unspecified: Secondary | ICD-10-CM | POA: Diagnosis not present

## 2016-07-19 DIAGNOSIS — F482 Pseudobulbar affect: Secondary | ICD-10-CM | POA: Diagnosis not present

## 2016-07-19 DIAGNOSIS — G4700 Insomnia, unspecified: Secondary | ICD-10-CM | POA: Diagnosis not present

## 2016-07-19 DIAGNOSIS — F0391 Unspecified dementia with behavioral disturbance: Secondary | ICD-10-CM | POA: Diagnosis not present

## 2016-07-19 DIAGNOSIS — F419 Anxiety disorder, unspecified: Secondary | ICD-10-CM | POA: Diagnosis not present

## 2016-07-27 DIAGNOSIS — F0390 Unspecified dementia without behavioral disturbance: Secondary | ICD-10-CM | POA: Diagnosis not present

## 2016-07-27 DIAGNOSIS — R1312 Dysphagia, oropharyngeal phase: Secondary | ICD-10-CM | POA: Diagnosis not present

## 2016-07-27 DIAGNOSIS — E43 Unspecified severe protein-calorie malnutrition: Secondary | ICD-10-CM | POA: Diagnosis not present

## 2016-07-28 DIAGNOSIS — E43 Unspecified severe protein-calorie malnutrition: Secondary | ICD-10-CM | POA: Diagnosis not present

## 2016-07-28 DIAGNOSIS — F0390 Unspecified dementia without behavioral disturbance: Secondary | ICD-10-CM | POA: Diagnosis not present

## 2016-07-28 DIAGNOSIS — R1312 Dysphagia, oropharyngeal phase: Secondary | ICD-10-CM | POA: Diagnosis not present

## 2016-07-30 ENCOUNTER — Emergency Department (HOSPITAL_COMMUNITY)
Admission: EM | Admit: 2016-07-30 | Discharge: 2016-07-30 | Disposition: A | Discharge status: Home / Self Care | Payer: Medicare Other | Attending: Emergency Medicine | Admitting: Emergency Medicine

## 2016-07-30 ENCOUNTER — Emergency Department (HOSPITAL_COMMUNITY): Payer: Medicare Other

## 2016-07-30 ENCOUNTER — Inpatient Hospital Stay
Admission: RE | Admit: 2016-07-30 | Discharge: 2017-05-20 | Disposition: E | Payer: Medicare Other | Source: Ambulatory Visit | Attending: Pulmonary Disease | Admitting: Pulmonary Disease

## 2016-07-30 ENCOUNTER — Encounter (HOSPITAL_COMMUNITY): Payer: Self-pay | Admitting: Emergency Medicine

## 2016-07-30 DIAGNOSIS — Z853 Personal history of malignant neoplasm of breast: Secondary | ICD-10-CM | POA: Diagnosis not present

## 2016-07-30 DIAGNOSIS — Z79899 Other long term (current) drug therapy: Secondary | ICD-10-CM | POA: Diagnosis not present

## 2016-07-30 DIAGNOSIS — W19XXXA Unspecified fall, initial encounter: Secondary | ICD-10-CM | POA: Diagnosis not present

## 2016-07-30 DIAGNOSIS — Y999 Unspecified external cause status: Secondary | ICD-10-CM | POA: Diagnosis not present

## 2016-07-30 DIAGNOSIS — E119 Type 2 diabetes mellitus without complications: Secondary | ICD-10-CM | POA: Diagnosis not present

## 2016-07-30 DIAGNOSIS — I2510 Atherosclerotic heart disease of native coronary artery without angina pectoris: Secondary | ICD-10-CM | POA: Diagnosis not present

## 2016-07-30 DIAGNOSIS — S199XXA Unspecified injury of neck, initial encounter: Secondary | ICD-10-CM | POA: Diagnosis not present

## 2016-07-30 DIAGNOSIS — S0083XA Contusion of other part of head, initial encounter: Secondary | ICD-10-CM | POA: Diagnosis not present

## 2016-07-30 DIAGNOSIS — Y929 Unspecified place or not applicable: Secondary | ICD-10-CM | POA: Insufficient documentation

## 2016-07-30 DIAGNOSIS — Y939 Activity, unspecified: Secondary | ICD-10-CM | POA: Diagnosis not present

## 2016-07-30 DIAGNOSIS — S3993XA Unspecified injury of pelvis, initial encounter: Secondary | ICD-10-CM | POA: Diagnosis not present

## 2016-07-30 DIAGNOSIS — S0990XA Unspecified injury of head, initial encounter: Secondary | ICD-10-CM | POA: Diagnosis present

## 2016-07-30 DIAGNOSIS — S22010A Wedge compression fracture of first thoracic vertebra, initial encounter for closed fracture: Secondary | ICD-10-CM | POA: Diagnosis not present

## 2016-07-30 DIAGNOSIS — I10 Essential (primary) hypertension: Secondary | ICD-10-CM | POA: Insufficient documentation

## 2016-07-30 DIAGNOSIS — R51 Headache: Secondary | ICD-10-CM | POA: Diagnosis not present

## 2016-07-30 NOTE — ED Notes (Signed)
Mild sob noted. Family in room states she does not usually breathe that hard. RN aware.

## 2016-07-30 NOTE — Discharge Instructions (Signed)
Follow-up with her doctor if needed °

## 2016-07-30 NOTE — ED Triage Notes (Signed)
Pt hx of alzheimers from [facility]. Staff states heard pt hollering and found pt in floor. Pt fell out of bed. Alert. Pt c/o pain behind head. No marks noted.

## 2016-07-30 NOTE — ED Provider Notes (Signed)
[facility] DEPT Provider Note   CSN: [id number] Arrival date & time: 08/08/2016  1726     History   Chief Complaint Chief Complaint  Patient presents with  . Fall    HPI Caroline Aguirre is a 83 y.o. female.  Patient was at a nursing home. And had a fall. Unwitnessed. Scott a history of dementia. She does complain of some pain in her head   The history is provided by a relative. No language interpreter was used.  Fall  This is a recurrent problem. The current episode started 12 to 24 hours ago. The problem occurs rarely. The problem has been resolved. Pertinent negatives include no chest pain. Nothing aggravates the symptoms. Nothing relieves the symptoms. She has tried nothing for the symptoms.    Past Medical History:  Diagnosis Date  . Arthritis   . Cancer ([place])    Rt Breast  . Coronary artery disease   . Cyst of breast    Right - at 55 yrs of age.  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 Diabetes mellitus without complication ([place])   . Diverticulitis   . Elevated cholesterol   . GERD (gastroesophageal reflux disease)   . Hardening of the arteries of the brain   . Heart disease   . Herniated disc   . Hx Breast cancer, DCIS, Right 08/19/2010  . Hypertension   . Migraine headache   . SBO (small bowel obstruction) ([place]) 09/02/11  . Shingles   . Stroke [facility]) 2004   cerebral hemmorhage    Patient Active Problem List   Diagnosis Date Noted  . Femoral neck fracture, right, closed, initial encounter 11/26/2015  . Closed right hip fracture ([place]) 11/23/2015  . Atrial fibrillation ([place]) 11/17/2015  . Personal history of stroke with current residual effects 11/17/2015  . Protein-calorie malnutrition, severe ([place]) 11/17/2015  . Upper GI bleed 11/12/2015  . Acute encephalopathy 03/16/2015  . DM type 2 (diabetes mellitus, type 2) ([place]) 03/16/2015  . Urinary retention 03/16/2015  . Coronary artery disease 03/16/2015  .  Essential hypertension 03/16/2015  . Sinus bradycardia 03/16/2015  . Vulvar irritation 07/17/2013  . SBO (small bowel obstruction) ([place]) 09/03/2011  . Cyst of breast, right, solitary 09/07/2010  . Fatigue/loss of sleep 09/07/2010  . High blood pressure 09/07/2010  . Bronchitis 09/07/2010  . Skin moles (abnormal) 09/07/2010  . Gastroesophageal reflux disease with hiatal hernia 09/07/2010  . Hearing loss 09/07/2010  . Wears glasses and/or contacts 09/07/2010  . Glaucoma 09/07/2010  . Menopause 09/07/2010  . Herniated disc   . Shingles   . Hardening of the arteries of the brain   . Stroke ([place])   . Hx Breast cancer, DCIS, Right 08/19/2010    Past Surgical History:  Procedure Laterality Date  . BRAIN SURGERY     hemorrhage  . BREAST SURGERY  09/25/2010   mastectomy  . CYSTECTOMY    . ESOPHAGOGASTRODUODENOSCOPY N/A 11/13/2015   Procedure: ESOPHAGOGASTRODUODENOSCOPY (EGD);  Surgeon: Rogene Houston, MD;  Location: [facility];  Service: Endoscopy;  Laterality: N/A;  . fibroid tumors    . KNEE ARTHROSCOPY Right    right knee x 3  . LAPAROSCOPIC BILATERAL SALPINGO OOPHERECTOMY    . SPINE SURGERY     repair herniated disc  . TOTAL HIP ARTHROPLASTY Right 11/26/2015   Procedure: HEMI HIP ARTHROPLASTY ANTERIOR APPROACH;  Surgeon: Rod Can, MD;  Location: [facility] ORS;  Service: Orthopedics;  Laterality: Right;    OB History    No data available       Home Medications    Prior to Admission medications   Medication Sig Start Date End Date Taking? Authorizing Provider  acetaminophen (TYLENOL) 325 MG tablet Take 325 mg by mouth daily as needed for moderate pain.   Yes [provider]  apixaban (ELIQUIS) 2.5 MG TABS tablet Take 1 tablet (2.5 mg total) by mouth 2 (two) times daily. 09/03/15  Yes [name], MD  calcium carbonate (TUMS EX) 750 MG chewable tablet Chew 2 tablets by mouth daily.    Yes [provider]  cetirizine (ZYRTEC) 10 MG tablet Take 10 mg by  mouth at bedtime.   Yes [provider]  gabapentin (NEURONTIN) 100 MG capsule Take 1 capsule (100 mg total) by mouth 2 (two) times daily. 11/19/15  Yes Sinda Du, MD  guaiFENesin-dextromethorphan (ROBITUSSIN DM) 100-10 MG/5ML syrup Take 15 mLs by mouth every 4 (four) hours as needed for cough.   Yes [provider]  levalbuterol (XOPENEX) 0.63 MG/3ML nebulizer solution Take 3 mLs (0.63 mg total) by nebulization every 6 (six) hours as needed for wheezing or shortness of breath. 11/19/15  Yes Sinda Du, MD  losartan (COZAAR) 50 MG tablet Take 1 tablet (50 mg total) by mouth daily. 09/04/15  Yes Josue Hector, MD  Methylcellulose, Laxative, (CITRUCEL) 500 MG TABS Take 1 tablet by mouth daily.    Yes [provider]  metoprolol 75 MG TABS Take 75 mg by mouth 2 (two) times daily. 11/28/15  Yes Mikhail, Maryann, DO  miconazole (MONISTAT 7) 2 % vaginal cream Place 1 Applicatorful vaginally at bedtime.   Yes [provider]  mirtazapine (REMERON) 7.5 MG tablet Take 7.5 mg by mouth at bedtime.   Yes [provider]  Multiple Vitamin (MULTIVITAMIN WITH MINERALS) TABS Take 1 tablet by mouth every morning. *Contains NO Iron (Ferrous Sulfate and/or Iodine)*   Yes [provider]  OLANZapine (ZYPREXA) 5 MG tablet Take 5 mg by mouth at bedtime.   Yes [provider]  pantoprazole (PROTONIX) 40 MG tablet Take 1 tablet (40 mg total) by mouth 2 (two) times daily. 11/19/15  Yes Sinda Du, MD  senna (SENOKOT) 8.6 MG TABS tablet Take 1 tablet by mouth at bedtime.   Yes [provider]  sertraline (ZOLOFT) 50 MG tablet Take 50 mg by mouth daily.   Yes [provider]  trimethoprim (TRIMPEX) 100 MG tablet Take 100 mg by mouth at bedtime.   Yes [provider]  vitamin C (ASCORBIC ACID) 500 MG tablet Take 500 mg by mouth every morning.    Yes [provider]  atorvastatin (LIPITOR) 80 MG tablet Take 1 tablet (80  mg total) by mouth every morning. Patient not taking: Reported on 08/14/2016 03/20/15   Sinda Du, MD  cefUROXime (CEFTIN) 250 MG tablet Take 1 tablet (250 mg total) by mouth 2 (two) times daily with a meal. Patient not taking: Reported on 08/18/2016 11/28/15   Cristal Ford, DO  feeding supplement, ENSURE ENLIVE, (ENSURE ENLIVE) LIQD Take 237 mLs by mouth 2 (two) times daily between meals. Patient not taking: Reported on 08/18/2016 11/28/15   Cristal Ford, DO  furosemide (LASIX) 20 MG tablet Take 1 tablet (20 mg total) by mouth daily. Patient not taking: Reported on 08/07/2016 09/04/15   Josue Hector, MD  LORazepam (ATIVAN) 0.5 MG tablet Take 1 tablet (0.5 mg total) by mouth every 8 (eight)  hours as needed for anxiety. Patient not taking: Reported on 08/13/2016 11/28/15   Cristal Ford, DO  oxybutynin (DITROPAN) 5 MG tablet Take 0.5 tablets (2.5 mg total) by mouth 3 (three) times daily. Patient not taking: Reported on 07/31/2016 11/19/15   Sinda Du, MD  oxyCODONE (OXY IR/ROXICODONE) 5 MG immediate release tablet Take 1 tablet (5 mg total) by mouth every 6 (six) hours as needed (moderate to severe pain). Patient not taking: Reported on 07/29/2016 11/28/15   Cristal Ford, DO  polyethylene glycol [facility] / GLYCOLAX) packet Take 17 g by mouth daily as needed for mild constipation. Patient not taking: Reported on 08/14/2016 11/28/15   Cristal Ford, DO  traMADol (ULTRAM) 50 MG tablet Take 1 tablet (50 mg total) by mouth every 6 (six) hours as needed for moderate pain (or Headache unrelieved by tylenol). Patient not taking: Reported on 08/03/2016 11/28/15   Cristal Ford, DO    Family History Family History  Problem Relation Age of Onset  . Cancer Mother   . Heart disease Father   . Spinal muscular atrophy Sister        spinal meningitis    Social History Social History  Substance Use Topics  . Smoking status: Never Smoker  . Smokeless tobacco: Never Used  . Alcohol use No       Allergies   Tape; Ciprofloxacin; Sulfa antibiotics; Codeine; and Morphine and related   Review of Systems Review of Systems  Unable to perform ROS: Dementia  Cardiovascular: Negative for chest pain.     Physical Exam Updated Vital Signs BP 140/75   Pulse 79   Temp 98 F (36.7 C) (Oral)   Resp (!) 30   SpO2 98%   Physical Exam  Constitutional: She appears well-developed.  HENT:  Head: Normocephalic.  Mild bruising before  Eyes: Conjunctivae and EOM are normal. No scleral icterus.  Neck: Neck supple. No thyromegaly present.  Cardiovascular: Normal rate and regular rhythm.  Exam reveals no gallop and no friction rub.   No murmur heard. Pulmonary/Chest: No stridor. She has no wheezes. She has no rales. She exhibits no tenderness.  Abdominal: She exhibits no distension. There is no tenderness. There is no rebound.  Musculoskeletal: Normal range of motion. She exhibits no edema.  Lymphadenopathy:    She has no cervical adenopathy.  Neurological: She is alert. She exhibits normal muscle tone. Coordination normal.  Oriented to person only  Skin: No rash noted. No erythema.     ED Treatments / Results  Labs (all labs ordered are listed, but only abnormal results are displayed) Labs Reviewed - No data to display  EKG  EKG Interpretation None       Radiology Dg Pelvis 1-2 Views  Result Date: 07/21/2016 CLINICAL DATA:  Status post unwitnessed fall, with concern for pelvic injury. Initial encounter. EXAM: PELVIS - 1-2 VIEW COMPARISON:  Pelvic radiograph performed 11/26/2015 FINDINGS: There is no evidence of fracture or dislocation. The patient's right hip arthroplasty is grossly unremarkable in appearance, with surrounding heterotopic bone formation. The sacroiliac joints are grossly unremarkable. Minimal degenerative change is noted at the lower lumbar spine. The visualized bowel gas pattern is grossly unremarkable in appearance. IMPRESSION: 1. No evidence of  fracture or dislocation. 2. Right hip arthroplasty is grossly unremarkable in appearance. No evidence of loosening. Electronically Signed   By: Garald Balding M.D.   On: 08/16/2016 19:09   Ct Head Wo Contrast  Result Date: 08/15/2016 CLINICAL DATA:  Patient was Alzheimer's disease. [place]  out of bed with posterior head pain. EXAM: CT HEAD WITHOUT CONTRAST CT CERVICAL SPINE WITHOUT CONTRAST TECHNIQUE: Multidetector CT imaging of the head and cervical spine was performed following the standard protocol without intravenous contrast. Multiplanar CT image reconstructions of the cervical spine were also generated. COMPARISON:  11/23/2015 FINDINGS: CT HEAD FINDINGS Brain: Ventricles, cisterns and other CSF spaces are unchanged. There is chronic ischemic microvascular disease present. Chronic low-attenuation over the right occipital region with slight hyperdensity to the adjacent cortex unchanged. No focal mass, mass effect, shift of midline structures or acute hemorrhage. Vascular: Calcified plaque over the cavernous segment of the internal carotid arteries bilaterally. Skull: No evidence of fracture. Sinuses/Orbits: Within normal. Other: Small scalp contusion over the high left parietal region. CT CERVICAL SPINE FINDINGS Alignment: Within normal. Skull base and vertebrae: Mild spondylosis throughout the cervical spine. There is a mild anterior wedge compression deformity involving T1 new since the previous exam. There is uncovertebral joint spurring and mild facet arthropathy. There is bilateral neural foramina narrowing at multiple levels due to adjacent bony spurring. Soft tissues and spinal canal: Prevertebral soft tissues are within normal. Spinal canal is within normal. Disc levels: There is mild disc space narrowing throughout the cervical spine. Upper chest: Unremarkable. Other: None. IMPRESSION: No acute intracranial findings. Small soft tissue contusion over the left parietal scalp. Chronic ischemic  microvascular disease. Chronic stable changes over the right occipital region. No acute cervical spine injury. Mild T1 compression fracture which may be acute or chronic and is new since the previous exam from 11/23/2015. Mild spondylosis throughout the cervical spine with multilevel disc disease and multilevel neural foraminal narrowing. Electronically Signed   By: Marin Olp M.D.   On: 07/25/2016 21:16   Ct Cervical Spine Wo Contrast  Result Date: 08/12/2016 CLINICAL DATA:  Patient was Alzheimer's disease. [place] out of bed with posterior head pain. EXAM: CT HEAD WITHOUT CONTRAST CT CERVICAL SPINE WITHOUT CONTRAST TECHNIQUE: Multidetector CT imaging of the head and cervical spine was performed following the standard protocol without intravenous contrast. Multiplanar CT image reconstructions of the cervical spine were also generated. COMPARISON:  11/23/2015 FINDINGS: CT HEAD FINDINGS Brain: Ventricles, cisterns and other CSF spaces are unchanged. There is chronic ischemic microvascular disease present. Chronic low-attenuation over the right occipital region with slight hyperdensity to the adjacent cortex unchanged. No focal mass, mass effect, shift of midline structures or acute hemorrhage. Vascular: Calcified plaque over the cavernous segment of the internal carotid arteries bilaterally. Skull: No evidence of fracture. Sinuses/Orbits: Within normal. Other: Small scalp contusion over the high left parietal region. CT CERVICAL SPINE FINDINGS Alignment: Within normal. Skull base and vertebrae: Mild spondylosis throughout the cervical spine. There is a mild anterior wedge compression deformity involving T1 new since the previous exam. There is uncovertebral joint spurring and mild facet arthropathy. There is bilateral neural foramina narrowing at multiple levels due to adjacent bony spurring. Soft tissues and spinal canal: Prevertebral soft tissues are within normal. Spinal canal is within normal. Disc levels:  There is mild disc space narrowing throughout the cervical spine. Upper chest: Unremarkable. Other: None. IMPRESSION: No acute intracranial findings. Small soft tissue contusion over the left parietal scalp. Chronic ischemic microvascular disease. Chronic stable changes over the right occipital region. No acute cervical spine injury. Mild T1 compression fracture which may be acute or chronic and is new since the previous exam from 11/23/2015. Mild spondylosis throughout the cervical spine with multilevel disc disease and multilevel neural foraminal narrowing. Electronically Signed  By: Marin Olp M.D.   On: 08/09/2016 21:16    Procedures Procedures (including critical care time)  Medications Ordered in ED Medications - No data to display   Initial Impression / Assessment and Plan / ED Course  I have reviewed the triage vital signs and the nursing notes.  Pertinent labs & imaging results that were available during my care of the patient were reviewed by me and considered in my medical decision making (see chart for details).     Fall with mild contusion to forehead. Cervical spine does show some mild T1 compression. Patient will follow-up with her PCP as needed  Final Clinical Impressions(s) / ED Diagnoses   Final diagnoses:  Fall, initial encounter    New Prescriptions New Prescriptions   No medications on file     Milton Ferguson, MD 08/06/2016 2130

## 2016-08-01 DIAGNOSIS — G459 Transient cerebral ischemic attack, unspecified: Secondary | ICD-10-CM | POA: Diagnosis not present

## 2016-08-01 DIAGNOSIS — N183 Chronic kidney disease, stage 3 (moderate): Secondary | ICD-10-CM | POA: Diagnosis not present

## 2016-08-01 DIAGNOSIS — F0391 Unspecified dementia with behavioral disturbance: Secondary | ICD-10-CM | POA: Diagnosis not present

## 2016-08-01 DIAGNOSIS — I482 Chronic atrial fibrillation: Secondary | ICD-10-CM | POA: Diagnosis not present

## 2016-08-02 DIAGNOSIS — E43 Unspecified severe protein-calorie malnutrition: Secondary | ICD-10-CM | POA: Diagnosis not present

## 2016-08-02 DIAGNOSIS — F0390 Unspecified dementia without behavioral disturbance: Secondary | ICD-10-CM | POA: Diagnosis not present

## 2016-08-02 DIAGNOSIS — R1312 Dysphagia, oropharyngeal phase: Secondary | ICD-10-CM | POA: Diagnosis not present

## 2016-08-02 NOTE — Progress Notes (Signed)
NAME:  RASHAY, BARNETTE                   ACCOUNT NO.:  MEDICAL RECORD NO.:  [id number]  LOCATION:                                 FACILITY:  PHYSICIAN:  Clevie Prout L. Luan Pulling, M.D.DATE OF BIRTH:  [birth year]  DATE OF PROCEDURE:  08/01/2016 DATE OF DISCHARGE:                                PROGRESS NOTE   This is progress note from the skilled care facility on Aug 01, 2016.  SUBJECTIVE:  When I come in the room, Ms. Hyde is lying quietly in her bed, asleep.  She has a Actuary with her, who says that she seemed to have had some problem breathing this morning.  Ms. Arenson is in no distress.  She is not agitated now.  No other new complaints.  I reviewed medications in the [facility] and I have recently reviewed pharmacy recommendations.  OBJECTIVE:  CONSTITUTIONAL:  She is sleepy, but arousable.  Not agitated now. CARDIOVASCULAR:  Her heart is regular with normal heart sounds. RESPIRATORY:  Her respiratory effort is normal and her lungs are clear now. GASTROINTESTINAL:  Her abdomen is soft with no masses. MUSCULOSKELETAL:  She is generalized weak, but does not have any focal abnormalities. SKIN:  Warm and dry.  I reviewed record from emergency room visit Jul 30, 2016, after she had fallen.  She does have a history of a hip fracture that has been repaired, but it appeared that all which she had from this episode was a mild contusion to the forehead and mild T1 compression, which I do not think we can really treat.  ASSESSMENT:  She has dementia, which is severe.  She has been very agitated and we have been getting help from Psychiatry to try to treat that and it does seem better.  She has had a recent fall and her injuries from this fall at least are minor.  She has history of coronary disease, which is stable.  She has had episodes of heart failure and that is stable.  She has had what I believe is probably aspiration pneumonia, but nothing recently.  She has had some trouble with  vaginal itching and she is being treated for that.  She had significant nutritional issues and that is being treated.  PLAN:  I do not think she is going to be able to be independent again. Her prognosis is poor.    Raegan Winders L. Luan Pulling, M.D.    ELH/MEDQ  D:  08/01/2016  T:  08/01/2016  Job:  [id number]

## 2016-08-03 DIAGNOSIS — F0390 Unspecified dementia without behavioral disturbance: Secondary | ICD-10-CM | POA: Diagnosis not present

## 2016-08-03 DIAGNOSIS — E43 Unspecified severe protein-calorie malnutrition: Secondary | ICD-10-CM | POA: Diagnosis not present

## 2016-08-03 DIAGNOSIS — R1312 Dysphagia, oropharyngeal phase: Secondary | ICD-10-CM | POA: Diagnosis not present

## 2016-08-10 DIAGNOSIS — R1312 Dysphagia, oropharyngeal phase: Secondary | ICD-10-CM | POA: Diagnosis not present

## 2016-08-10 DIAGNOSIS — F0390 Unspecified dementia without behavioral disturbance: Secondary | ICD-10-CM | POA: Diagnosis not present

## 2016-08-10 DIAGNOSIS — E43 Unspecified severe protein-calorie malnutrition: Secondary | ICD-10-CM | POA: Diagnosis not present

## 2016-08-11 DIAGNOSIS — R1312 Dysphagia, oropharyngeal phase: Secondary | ICD-10-CM | POA: Diagnosis not present

## 2016-08-11 DIAGNOSIS — E43 Unspecified severe protein-calorie malnutrition: Secondary | ICD-10-CM | POA: Diagnosis not present

## 2016-08-11 DIAGNOSIS — F0390 Unspecified dementia without behavioral disturbance: Secondary | ICD-10-CM | POA: Diagnosis not present

## 2016-08-13 DIAGNOSIS — E43 Unspecified severe protein-calorie malnutrition: Secondary | ICD-10-CM | POA: Diagnosis not present

## 2016-08-13 DIAGNOSIS — F0390 Unspecified dementia without behavioral disturbance: Secondary | ICD-10-CM | POA: Diagnosis not present

## 2016-08-13 DIAGNOSIS — R1312 Dysphagia, oropharyngeal phase: Secondary | ICD-10-CM | POA: Diagnosis not present

## 2016-08-16 DIAGNOSIS — R1312 Dysphagia, oropharyngeal phase: Secondary | ICD-10-CM | POA: Diagnosis not present

## 2016-08-16 DIAGNOSIS — F0390 Unspecified dementia without behavioral disturbance: Secondary | ICD-10-CM | POA: Diagnosis not present

## 2016-08-16 DIAGNOSIS — E43 Unspecified severe protein-calorie malnutrition: Secondary | ICD-10-CM | POA: Diagnosis not present

## 2016-08-17 DIAGNOSIS — E43 Unspecified severe protein-calorie malnutrition: Secondary | ICD-10-CM | POA: Diagnosis not present

## 2016-08-17 DIAGNOSIS — R1312 Dysphagia, oropharyngeal phase: Secondary | ICD-10-CM | POA: Diagnosis not present

## 2016-08-17 DIAGNOSIS — F0390 Unspecified dementia without behavioral disturbance: Secondary | ICD-10-CM | POA: Diagnosis not present

## 2016-08-18 DIAGNOSIS — R1312 Dysphagia, oropharyngeal phase: Secondary | ICD-10-CM | POA: Diagnosis not present

## 2016-08-18 DIAGNOSIS — E43 Unspecified severe protein-calorie malnutrition: Secondary | ICD-10-CM | POA: Diagnosis not present

## 2016-08-18 DIAGNOSIS — F0390 Unspecified dementia without behavioral disturbance: Secondary | ICD-10-CM | POA: Diagnosis not present

## 2016-08-19 ENCOUNTER — Encounter (HOSPITAL_COMMUNITY)
Admission: RE | Admit: 2016-08-19 | Discharge: 2016-08-19 | Disposition: A | Discharge status: Home / Self Care | Payer: Medicare Other | Source: Skilled Nursing Facility | Attending: Pulmonary Disease | Admitting: Pulmonary Disease

## 2016-08-19 DIAGNOSIS — X58XXXD Exposure to other specified factors, subsequent encounter: Secondary | ICD-10-CM | POA: Diagnosis not present

## 2016-08-19 DIAGNOSIS — Z471 Aftercare following joint replacement surgery: Secondary | ICD-10-CM | POA: Insufficient documentation

## 2016-08-19 DIAGNOSIS — Z96641 Presence of right artificial hip joint: Secondary | ICD-10-CM | POA: Diagnosis not present

## 2016-08-19 DIAGNOSIS — S72041D Displaced fracture of base of neck of right femur, subsequent encounter for closed fracture with routine healing: Secondary | ICD-10-CM | POA: Insufficient documentation

## 2016-08-19 DIAGNOSIS — E119 Type 2 diabetes mellitus without complications: Secondary | ICD-10-CM | POA: Diagnosis not present

## 2016-08-19 DIAGNOSIS — R1312 Dysphagia, oropharyngeal phase: Secondary | ICD-10-CM | POA: Insufficient documentation

## 2016-08-19 LAB — BASIC METABOLIC PANEL
ANION GAP: 10 (ref 5–15)
BUN: 23 mg/dL — AB (ref 6–20)
CHLORIDE: 102 mmol/L (ref 101–111)
CO2: 27 mmol/L (ref 22–32)
Calcium: 9 mg/dL (ref 8.9–10.3)
Creatinine, Ser: 0.77 mg/dL (ref 0.44–1.00)
GFR calc Af Amer: >60 mL/min (ref >60)
Glucose, Bld: 99 mg/dL (ref 65–99)
POTASSIUM: 3.9 mmol/L (ref 3.5–5.1)
Sodium: 139 mmol/L (ref 135–145)

## 2016-08-19 LAB — CBC WITH DIFFERENTIAL/PLATELET
BASOS ABS: 0 10*3/uL (ref 0.0–0.1)
Basophils Relative: 0 %
Eosinophils Absolute: 0.3 10*3/uL (ref 0.0–0.7)
Eosinophils Relative: 4 %
HCT: 43 % (ref 36.0–46.0)
HEMOGLOBIN: 13.5 g/dL (ref 12.0–15.0)
LYMPHS ABS: 1.6 10*3/uL (ref 0.7–4.0)
LYMPHS PCT: 21 %
MCH: 26 pg (ref 26.0–34.0)
MCHC: 31.4 g/dL (ref 30.0–36.0)
MCV: 82.9 fL (ref 78.0–100.0)
Monocytes Absolute: 0.9 10*3/uL (ref 0.1–1.0)
Monocytes Relative: 12 %
NEUTROS PCT: 63 %
Neutro Abs: 4.8 10*3/uL (ref 1.7–7.7)
PLATELETS: 138 10*3/uL — AB (ref 150–400)
RBC: 5.19 MIL/uL — AB (ref 3.87–5.11)
RDW: 16.5 % — ABNORMAL HIGH (ref 11.5–15.5)
WBC: 7.6 10*3/uL (ref 4.0–10.5)

## 2016-08-20 LAB — HEMOGLOBIN A1C
Hgb A1c MFr Bld: 5.9 % — ABNORMAL HIGH (ref 4.8–5.6)
Mean Plasma Glucose: 123 mg/dL

## 2016-08-20 DEATH — deceased

## 2016-08-25 DIAGNOSIS — F0391 Unspecified dementia with behavioral disturbance: Secondary | ICD-10-CM | POA: Diagnosis not present

## 2016-08-25 DIAGNOSIS — R1312 Dysphagia, oropharyngeal phase: Secondary | ICD-10-CM | POA: Diagnosis not present

## 2016-08-25 DIAGNOSIS — E43 Unspecified severe protein-calorie malnutrition: Secondary | ICD-10-CM | POA: Diagnosis not present

## 2016-08-26 DIAGNOSIS — F0391 Unspecified dementia with behavioral disturbance: Secondary | ICD-10-CM | POA: Diagnosis not present

## 2016-08-26 DIAGNOSIS — R1312 Dysphagia, oropharyngeal phase: Secondary | ICD-10-CM | POA: Diagnosis not present

## 2016-08-26 DIAGNOSIS — E43 Unspecified severe protein-calorie malnutrition: Secondary | ICD-10-CM | POA: Diagnosis not present

## 2016-08-27 DIAGNOSIS — E43 Unspecified severe protein-calorie malnutrition: Secondary | ICD-10-CM | POA: Diagnosis not present

## 2016-08-27 DIAGNOSIS — F0391 Unspecified dementia with behavioral disturbance: Secondary | ICD-10-CM | POA: Diagnosis not present

## 2016-08-27 DIAGNOSIS — R1312 Dysphagia, oropharyngeal phase: Secondary | ICD-10-CM | POA: Diagnosis not present

## 2016-08-28 DIAGNOSIS — N183 Chronic kidney disease, stage 3 (moderate): Secondary | ICD-10-CM | POA: Diagnosis not present

## 2016-08-28 DIAGNOSIS — I482 Chronic atrial fibrillation: Secondary | ICD-10-CM | POA: Diagnosis not present

## 2016-08-28 DIAGNOSIS — F0391 Unspecified dementia with behavioral disturbance: Secondary | ICD-10-CM | POA: Diagnosis not present

## 2016-08-28 DIAGNOSIS — G459 Transient cerebral ischemic attack, unspecified: Secondary | ICD-10-CM | POA: Diagnosis not present

## 2016-08-28 NOTE — Progress Notes (Signed)
Subjective: She is sleeping. She does arouse. She doesn't voice any complaints. I can tell for sure if she's confused now. She is in the skilled care facility.  MAR reviewed. She is on medications for dementia and she is also on medications for her severe agitation. She has had psychiatric consultation for help with the agitation. Medications appear appropriate  Objective: Constitutional: She is sleeping initially but arousable. She is very thin. She is hard of hearing. Cardiovascular: Her heart is irregular with normal heart sounds. Respiratory: Her respiratory effort is increased but she doesn't have any wheezing. Gastrointestinal: Her abdomen is soft. Neurological: She is confused I think and has hemiparesis from her stroke  Assessment: She has had a stroke. She has had a hip fracture. She has dementia and that has caused to be very agitated. She's had some respiratory issues but that seems better. She has chronic atrial fib which is well controlled  Plan: Continue current treatments. No changes in medications

## 2016-08-30 DIAGNOSIS — E43 Unspecified severe protein-calorie malnutrition: Secondary | ICD-10-CM | POA: Diagnosis not present

## 2016-08-30 DIAGNOSIS — R1312 Dysphagia, oropharyngeal phase: Secondary | ICD-10-CM | POA: Diagnosis not present

## 2016-08-30 DIAGNOSIS — F0391 Unspecified dementia with behavioral disturbance: Secondary | ICD-10-CM | POA: Diagnosis not present

## 2016-08-31 DIAGNOSIS — E43 Unspecified severe protein-calorie malnutrition: Secondary | ICD-10-CM | POA: Diagnosis not present

## 2016-08-31 DIAGNOSIS — R1312 Dysphagia, oropharyngeal phase: Secondary | ICD-10-CM | POA: Diagnosis not present

## 2016-08-31 DIAGNOSIS — F0391 Unspecified dementia with behavioral disturbance: Secondary | ICD-10-CM | POA: Diagnosis not present

## 2016-09-19 DIAGNOSIS — N183 Chronic kidney disease, stage 3 (moderate): Secondary | ICD-10-CM | POA: Diagnosis not present

## 2016-09-19 DIAGNOSIS — I482 Chronic atrial fibrillation: Secondary | ICD-10-CM | POA: Diagnosis not present

## 2016-09-19 DIAGNOSIS — G459 Transient cerebral ischemic attack, unspecified: Secondary | ICD-10-CM | POA: Diagnosis not present

## 2016-09-19 DIAGNOSIS — F0391 Unspecified dementia with behavioral disturbance: Secondary | ICD-10-CM | POA: Diagnosis not present

## 2016-09-20 NOTE — Progress Notes (Signed)
This is documentation of my visit at the skilled care facility of 09/19/2016. Dragon is not available at the skilled care facility.  Subjective: When I entered the room she is sleeping quietly. She is very thin. No new issues noted by nursing staff. She has no complaints. She continues to have issues with cough and congestion and very well may be aspirating  Medication list reviewed in the [facility]  Objective: Constitutional: She is very thin. Cardiovascular: She is in atrial fib with good ventricular response. Respiratory: Her respiratory effort is normal and her lungs are pretty clear. Gastrointestinal: Her abdomen is soft with no masses. Neurological: She is very confused psychiatric: She is less agitated than my last visit.  Assessment: She has dementia and has episodes of agitation which seems a little better. She has chronic atrial fib and that's doing better. She has had trouble with cough and congestion I think she's probably been aspirating. She has had a previous stroke which makes her more likely to have that happen. She has had previous fractures but nothing recently. She has malnutrition which is stable she is on nutritional supplements  Plan: I don't think there is anything to add. Continue to watch her closely.

## 2016-09-29 ENCOUNTER — Other Ambulatory Visit (HOSPITAL_COMMUNITY)
Admission: RE | Admit: 2016-09-29 | Discharge: 2016-09-29 | Disposition: A | Discharge status: Home / Self Care | Payer: Medicare Other | Source: Skilled Nursing Facility | Attending: Pulmonary Disease | Admitting: Pulmonary Disease

## 2016-09-29 DIAGNOSIS — I10 Essential (primary) hypertension: Secondary | ICD-10-CM | POA: Insufficient documentation

## 2016-09-29 DIAGNOSIS — I639 Cerebral infarction, unspecified: Secondary | ICD-10-CM | POA: Diagnosis not present

## 2016-09-29 DIAGNOSIS — I482 Chronic atrial fibrillation: Secondary | ICD-10-CM | POA: Insufficient documentation

## 2016-09-29 LAB — LIPID PANEL
CHOL/HDL RATIO: 3.8 ratio
CHOLESTEROL: 118 mg/dL (ref 0–200)
HDL: 31 mg/dL — AB (ref >40)
LDL Cholesterol: 73 mg/dL (ref 0–99)
TRIGLYCERIDES: 68 mg/dL (ref <150)
VLDL: 14 mg/dL (ref 0–40)

## 2016-10-21 ENCOUNTER — Other Ambulatory Visit (HOSPITAL_COMMUNITY)
Admission: RE | Admit: 2016-10-21 | Discharge: 2016-10-21 | Disposition: A | Discharge status: Home / Self Care | Payer: Medicare Other | Source: Skilled Nursing Facility | Attending: Pulmonary Disease | Admitting: Pulmonary Disease

## 2016-10-21 DIAGNOSIS — N390 Urinary tract infection, site not specified: Secondary | ICD-10-CM | POA: Diagnosis not present

## 2016-10-21 LAB — URINALYSIS, COMPLETE (UACMP) WITH MICROSCOPIC
BILIRUBIN URINE: NEGATIVE
Glucose, UA: NEGATIVE mg/dL
Hgb urine dipstick: NEGATIVE
Ketones, ur: NEGATIVE mg/dL
Nitrite: NEGATIVE
PH: 8 (ref 5.0–8.0)
Protein, ur: 30 mg/dL — AB
SPECIFIC GRAVITY, URINE: 1.017 (ref 1.005–1.030)

## 2016-10-23 LAB — URINE CULTURE

## 2016-10-28 ENCOUNTER — Ambulatory Visit (INDEPENDENT_AMBULATORY_CARE_PROVIDER_SITE_OTHER): Payer: Medicare Other | Admitting: Otolaryngology

## 2016-10-28 DIAGNOSIS — H903 Sensorineural hearing loss, bilateral: Secondary | ICD-10-CM | POA: Diagnosis not present

## 2016-10-28 DIAGNOSIS — H6123 Impacted cerumen, bilateral: Secondary | ICD-10-CM

## 2016-10-31 DIAGNOSIS — G459 Transient cerebral ischemic attack, unspecified: Secondary | ICD-10-CM | POA: Diagnosis not present

## 2016-10-31 DIAGNOSIS — I482 Chronic atrial fibrillation: Secondary | ICD-10-CM | POA: Diagnosis not present

## 2016-10-31 DIAGNOSIS — N183 Chronic kidney disease, stage 3 (moderate): Secondary | ICD-10-CM | POA: Diagnosis not present

## 2016-10-31 DIAGNOSIS — F0391 Unspecified dementia with behavioral disturbance: Secondary | ICD-10-CM | POA: Diagnosis not present

## 2016-11-02 ENCOUNTER — Other Ambulatory Visit (HOSPITAL_COMMUNITY)
Admission: AD | Admit: 2016-11-02 | Discharge: 2016-11-02 | Disposition: A | Discharge status: Home / Self Care | Payer: Medicare Other | Source: Skilled Nursing Facility | Attending: Pulmonary Disease | Admitting: Pulmonary Disease

## 2016-11-02 DIAGNOSIS — K219 Gastro-esophageal reflux disease without esophagitis: Secondary | ICD-10-CM | POA: Insufficient documentation

## 2016-11-02 LAB — C DIFFICILE QUICK SCREEN W PCR REFLEX
C Diff antigen: NEGATIVE
C Diff interpretation: NOT DETECTED
C Diff toxin: NEGATIVE

## 2016-11-10 NOTE — Progress Notes (Signed)
When I reviewed her record this morning I found that the progress note that I dictated on the "call in" Earlier this month was not available so I will redictate  Subjective: When I came into her room she was sleeping soundly. She still has had some issues with behavioral problems according to nursing staff but overall has done better.  Objective: Constitutional: She is sleeping soundly. She did wake up but is confused and does not recognize me. Cardiovascular: She is in atrial fib with well-controlled ventricular response. Respiratory: Respiratory effort is normal. Her lungs are clear today. Gastrointestinal: Her abdomen is soft with no masses. Skin: Warm and dry. Neurological: She is very confused. Psychiatric: She has been very anxious but is better today  I reviewed the [facility] and found her medications to be appropriate  Assessment: She has chronic atrial fib. She has had multiple urinary tract infections. She has previous history of stroke probably related to her atrial fib. She has dementia which is getting worse. She's had significant behavioral issues and that is about the same she has had psychiatric consult help with that. She has history of breast cancer which is in remission  Plan: Continue treatments

## 2016-11-16 DIAGNOSIS — F0391 Unspecified dementia with behavioral disturbance: Secondary | ICD-10-CM | POA: Diagnosis not present

## 2016-11-16 DIAGNOSIS — R279 Unspecified lack of coordination: Secondary | ICD-10-CM | POA: Diagnosis not present

## 2016-11-16 DIAGNOSIS — M6281 Muscle weakness (generalized): Secondary | ICD-10-CM | POA: Diagnosis not present

## 2016-11-16 DIAGNOSIS — I639 Cerebral infarction, unspecified: Secondary | ICD-10-CM | POA: Diagnosis not present

## 2016-11-17 DIAGNOSIS — I639 Cerebral infarction, unspecified: Secondary | ICD-10-CM | POA: Diagnosis not present

## 2016-11-17 DIAGNOSIS — R279 Unspecified lack of coordination: Secondary | ICD-10-CM | POA: Diagnosis not present

## 2016-11-17 DIAGNOSIS — M6281 Muscle weakness (generalized): Secondary | ICD-10-CM | POA: Diagnosis not present

## 2016-11-17 DIAGNOSIS — F0391 Unspecified dementia with behavioral disturbance: Secondary | ICD-10-CM | POA: Diagnosis not present

## 2016-11-18 DIAGNOSIS — I639 Cerebral infarction, unspecified: Secondary | ICD-10-CM | POA: Diagnosis not present

## 2016-11-18 DIAGNOSIS — M6281 Muscle weakness (generalized): Secondary | ICD-10-CM | POA: Diagnosis not present

## 2016-11-18 DIAGNOSIS — F0391 Unspecified dementia with behavioral disturbance: Secondary | ICD-10-CM | POA: Diagnosis not present

## 2016-11-18 DIAGNOSIS — R279 Unspecified lack of coordination: Secondary | ICD-10-CM | POA: Diagnosis not present

## 2016-11-19 DIAGNOSIS — F0391 Unspecified dementia with behavioral disturbance: Secondary | ICD-10-CM | POA: Diagnosis not present

## 2016-11-19 DIAGNOSIS — M6281 Muscle weakness (generalized): Secondary | ICD-10-CM | POA: Diagnosis not present

## 2016-11-19 DIAGNOSIS — I639 Cerebral infarction, unspecified: Secondary | ICD-10-CM | POA: Diagnosis not present

## 2016-11-19 DIAGNOSIS — R279 Unspecified lack of coordination: Secondary | ICD-10-CM | POA: Diagnosis not present

## 2016-11-22 DIAGNOSIS — F0391 Unspecified dementia with behavioral disturbance: Secondary | ICD-10-CM | POA: Diagnosis not present

## 2016-11-22 DIAGNOSIS — R279 Unspecified lack of coordination: Secondary | ICD-10-CM | POA: Diagnosis not present

## 2016-11-22 DIAGNOSIS — M6281 Muscle weakness (generalized): Secondary | ICD-10-CM | POA: Diagnosis not present

## 2016-11-22 DIAGNOSIS — I639 Cerebral infarction, unspecified: Secondary | ICD-10-CM | POA: Diagnosis not present

## 2016-11-23 DIAGNOSIS — F0391 Unspecified dementia with behavioral disturbance: Secondary | ICD-10-CM | POA: Diagnosis not present

## 2016-11-23 DIAGNOSIS — M6281 Muscle weakness (generalized): Secondary | ICD-10-CM | POA: Diagnosis not present

## 2016-11-23 DIAGNOSIS — I639 Cerebral infarction, unspecified: Secondary | ICD-10-CM | POA: Diagnosis not present

## 2016-11-23 DIAGNOSIS — R279 Unspecified lack of coordination: Secondary | ICD-10-CM | POA: Diagnosis not present

## 2016-11-24 DIAGNOSIS — I639 Cerebral infarction, unspecified: Secondary | ICD-10-CM | POA: Diagnosis not present

## 2016-11-24 DIAGNOSIS — M6281 Muscle weakness (generalized): Secondary | ICD-10-CM | POA: Diagnosis not present

## 2016-11-24 DIAGNOSIS — F0391 Unspecified dementia with behavioral disturbance: Secondary | ICD-10-CM | POA: Diagnosis not present

## 2016-11-24 DIAGNOSIS — R279 Unspecified lack of coordination: Secondary | ICD-10-CM | POA: Diagnosis not present

## 2016-11-25 DIAGNOSIS — F0391 Unspecified dementia with behavioral disturbance: Secondary | ICD-10-CM | POA: Diagnosis not present

## 2016-11-25 DIAGNOSIS — I639 Cerebral infarction, unspecified: Secondary | ICD-10-CM | POA: Diagnosis not present

## 2016-11-25 DIAGNOSIS — R279 Unspecified lack of coordination: Secondary | ICD-10-CM | POA: Diagnosis not present

## 2016-11-25 DIAGNOSIS — M6281 Muscle weakness (generalized): Secondary | ICD-10-CM | POA: Diagnosis not present

## 2016-11-26 DIAGNOSIS — R279 Unspecified lack of coordination: Secondary | ICD-10-CM | POA: Diagnosis not present

## 2016-11-26 DIAGNOSIS — F0391 Unspecified dementia with behavioral disturbance: Secondary | ICD-10-CM | POA: Diagnosis not present

## 2016-11-26 DIAGNOSIS — I639 Cerebral infarction, unspecified: Secondary | ICD-10-CM | POA: Diagnosis not present

## 2016-11-26 DIAGNOSIS — M6281 Muscle weakness (generalized): Secondary | ICD-10-CM | POA: Diagnosis not present

## 2016-11-29 ENCOUNTER — Non-Acute Institutional Stay (SKILLED_NURSING_FACILITY): Payer: Medicare Other | Admitting: Internal Medicine

## 2016-11-29 ENCOUNTER — Encounter: Payer: Self-pay | Admitting: Internal Medicine

## 2016-11-29 DIAGNOSIS — F0391 Unspecified dementia with behavioral disturbance: Secondary | ICD-10-CM

## 2016-11-29 DIAGNOSIS — I482 Chronic atrial fibrillation, unspecified: Secondary | ICD-10-CM

## 2016-11-29 DIAGNOSIS — E118 Type 2 diabetes mellitus with unspecified complications: Secondary | ICD-10-CM

## 2016-11-29 DIAGNOSIS — R279 Unspecified lack of coordination: Secondary | ICD-10-CM | POA: Diagnosis not present

## 2016-11-29 DIAGNOSIS — I6930 Unspecified sequelae of cerebral infarction: Secondary | ICD-10-CM | POA: Diagnosis not present

## 2016-11-29 DIAGNOSIS — F0392 Unspecified dementia, unspecified severity, with psychotic disturbance: Secondary | ICD-10-CM | POA: Insufficient documentation

## 2016-11-29 DIAGNOSIS — I10 Essential (primary) hypertension: Secondary | ICD-10-CM

## 2016-11-29 DIAGNOSIS — I639 Cerebral infarction, unspecified: Secondary | ICD-10-CM | POA: Diagnosis not present

## 2016-11-29 DIAGNOSIS — M6281 Muscle weakness (generalized): Secondary | ICD-10-CM | POA: Diagnosis not present

## 2016-11-29 NOTE — Progress Notes (Signed)
Provider:  Veleta Miners Location:   [facility] Room Number: 093/G Place of Service:  SNF (31)  PCP: Virgie Dad, MD Patient Care Team: Virgie Dad, MD as PCP - General (Internal Medicine) Neldon Mc, MD (General Surgery) Jonnie Kind, MD as Consulting Physician (Obstetrics and Gynecology) Wille Celeste, PA-C as Physician Assistant (Internal Medicine)  Extended Emergency Contact Information Primary Emergency Contact: Toms,Denise          [place], [place] [address] Johnnette Litter of [place] Phone: [phone] Mobile Phone: [phone] Relation: Relative Secondary Emergency Contact: Nevada Crane Address: [address]          [place], [place] [address] Johnnette Litter of [place] Phone: [phone] Relation: Son  Code Status: DNR Goals of Care: Advanced Directive information Advanced Directives 11/29/2016  Does Patient Have a Medical Advance Directive? Yes  Type of Advance Directive Out of facility DNR (pink MOST or yellow form)  Does patient want to make changes to medical advance directive? No - Patient declined  Copy of [place] in Chart? -  Would patient like information on creating a medical advance directive? -  Pre-existing out of facility DNR order (yellow form or pink MOST form) -      Chief Complaint  Patient presents with  . New Admit To SNF    New admission visit    HPI: Patient is a 83 y.o. female seen today for admission to SNF  Patient is new transfer to our practice. She is long term resident of facility. She has h/o CVA in 2016 , Chronic Atrial fibrillation, Hypertension, recurrent UTI , recurrent Falls , S/p Hip Fracture , GI bleed, CKD,  and Dementia with behavior Issues.  Patient was seen today for routine admission. Per Nurses she is doing well in facility. She has good appetite and her weight is stable at 120 lbs. Patient does have behavior issues and needs ativan PRN. She wa very calm today.  Denied any problems.   Past Medical History:  Diagnosis Date  . Arthritis   . Cancer ([place])    Rt Breast  . Coronary artery disease   . Cyst of breast    Right - at 70 yrs of age.  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 Diabetes mellitus without complication ([place])   . Diverticulitis   . Elevated cholesterol   . GERD (gastroesophageal reflux disease)   . Hardening of the arteries of the brain   . Heart disease   . Herniated disc   . Hx Breast cancer, DCIS, Right 08/19/2010  . Hypertension   . Migraine headache   . SBO (small bowel obstruction) ([place]) 09/02/11  . Shingles   . Stroke [facility]) 2004   cerebral hemmorhage   Past Surgical History:  Procedure Laterality Date  . BRAIN SURGERY     hemorrhage  . BREAST SURGERY  09/25/2010   mastectomy  . CYSTECTOMY    . ESOPHAGOGASTRODUODENOSCOPY N/A 11/13/2015   Procedure: ESOPHAGOGASTRODUODENOSCOPY (EGD);  Surgeon: Rogene Houston, MD;  Location: [facility]ITE;  Service: Endoscopy;  Laterality: N/A;  . fibroid tumors    . KNEE ARTHROSCOPY Right    right knee x 3  . LAPAROSCOPIC BILATERAL SALPINGO OOPHERECTOMY    . SPINE SURGERY     repair herniated disc  . TOTAL HIP ARTHROPLASTY Right 11/26/2015   Procedure: HEMI HIP ARTHROPLASTY ANTERIOR APPROACH;  Surgeon: Rod Can, MD;  Location: [facility] ORS;  Service: Orthopedics;  Laterality: Right;    reports that she has never smoked. She has never used smokeless tobacco. She reports that she does not drink alcohol or use drugs. Social History   Social History  . Marital status: Divorced    Spouse name: N/A  . Number of children: N/A  . Years of education: N/A   Occupational History  . Not on file.   Social History Main Topics  . Smoking status: Never Smoker  . Smokeless tobacco: Never Used  . Alcohol use No  . Drug use: No  . Sexual activity: Not on file   Other Topics Concern  . Not on file   Social History Narrative  . No narrative on  file    Functional Status Survey:    Family History  Problem Relation Age of Onset  . Cancer Mother   . Heart disease Father   . Spinal muscular atrophy Sister        spinal meningitis    Health Maintenance  Topic Date Due  . INFLUENZA VACCINE  02/19/2017 (Originally 10/20/2016)  . FOOT EXAM  02/19/2017 (Originally 03/22/1934)  . OPHTHALMOLOGY EXAM  02/19/2017 (Originally 03/22/1934)  . URINE MICROALBUMIN  02/19/2017 (Originally 03/22/1934)  . DEXA SCAN  02/19/2017 (Originally 03/23/1991)  . TETANUS/TDAP  02/19/2017 (Originally 03/22/1943)  . PNA vac Low Risk Adult (1 of 2 - PCV13) 02/19/2017 (Originally 03/23/1991)  . HEMOGLOBIN A1C  02/18/2017    Allergies  Allergen Reactions  . Tape Rash  . Ciprofloxacin   . Sulfa Antibiotics Hives  . Codeine Nausea Only    nausea.  . Morphine And Related Nausea Only    Nausea only.    Outpatient Encounter Prescriptions as of 11/29/2016  Medication Sig  . ALPRAZolam (XANAX) 0.25 MG tablet Take 0.25 mg by mouth at bedtime as needed for anxiety. Give 1 tablet by mouth three times a day  . apixaban (ELIQUIS) 2.5 MG TABS tablet Take 1 tablet (2.5 mg total) by mouth 2 (two) times daily.  Roseanne Kaufman [place]-Castor Oil (VENELEX) OINT Apply to sacrum and bilateral buttocks q shift and prn  . gabapentin (NEURONTIN) 100 MG capsule Take 1 capsule (100 mg total) by mouth 2 (two) times daily.  Marland Kitchen losartan (COZAAR) 50 MG tablet Take 1 tablet (50 mg total) by mouth daily.  . metoprolol 75 MG TABS Take 75 mg by mouth 2 (two) times daily.  . mirtazapine (REMERON) 7.5 MG tablet Take 7.5 mg by mouth at bedtime.  . NON FORMULARY 1 neb inhalation four times a dayprn  . OLANZapine (ZYPREXA) 5 MG tablet Take 5 mg by mouth at bedtime.  . pantoprazole (PROTONIX) 40 MG tablet Take 1 tablet (40 mg total) by mouth 2 (two) times daily.  Marland Kitchen senna (SENOKOT) 8.6 MG TABS tablet Take 1 tablet by mouth at bedtime.  . sertraline (ZOLOFT) 50 MG tablet Take 50 mg by mouth daily.  Marland Kitchen  trimethoprim (TRIMPEX) 100 MG tablet Take 100 mg by mouth at bedtime.  . [DISCONTINUED] acetaminophen (TYLENOL) 325 MG tablet Take 325 mg by mouth daily as needed for moderate pain.  . [DISCONTINUED] atorvastatin (LIPITOR) 80 MG tablet Take 1 tablet (80 mg total) by mouth every morning. (Patient not taking: Reported on 08/01/2016)  . [DISCONTINUED] calcium carbonate (TUMS EX) 750 MG chewable tablet Chew 2 tablets by mouth daily.   . [DISCONTINUED] cefUROXime (CEFTIN) 250 MG tablet Take 1 tablet (250 mg total) by mouth 2 (two) times daily with  a meal. (Patient not taking: Reported on 08/16/2016)  . [DISCONTINUED] cetirizine (ZYRTEC) 10 MG tablet Take 10 mg by mouth at bedtime.  . [DISCONTINUED] feeding supplement, ENSURE ENLIVE, (ENSURE ENLIVE) LIQD Take 237 mLs by mouth 2 (two) times daily between meals.  . [DISCONTINUED] furosemide (LASIX) 20 MG tablet Take 1 tablet (20 mg total) by mouth daily. (Patient not taking: Reported on 07/20/2016)  . [DISCONTINUED] guaiFENesin-dextromethorphan (ROBITUSSIN DM) 100-10 MG/5ML syrup Take 15 mLs by mouth every 4 (four) hours as needed for cough.  . [DISCONTINUED] levalbuterol (XOPENEX) 0.63 MG/3ML nebulizer solution Take 3 mLs (0.63 mg total) by nebulization every 6 (six) hours as needed for wheezing or shortness of breath.  . [DISCONTINUED] LORazepam (ATIVAN) 0.5 MG tablet Take 1 tablet (0.5 mg total) by mouth every 8 (eight) hours as needed for anxiety. (Patient not taking: Reported on 07/29/2016)  . [DISCONTINUED] Methylcellulose, Laxative, (CITRUCEL) 500 MG TABS Take 1 tablet by mouth daily.   . [DISCONTINUED] miconazole (MONISTAT 7) 2 % vaginal cream Place 1 Applicatorful vaginally at bedtime.  . [DISCONTINUED] Multiple Vitamin (MULTIVITAMIN WITH MINERALS) TABS Take 1 tablet by mouth every morning. *Contains NO Iron (Ferrous Sulfate and/or Iodine)*  . [DISCONTINUED] oxybutynin (DITROPAN) 5 MG tablet Take 0.5 tablets (2.5 mg total) by mouth 3 (three) times daily.  (Patient not taking: Reported on 08/10/2016)  . [DISCONTINUED] oxyCODONE (OXY IR/ROXICODONE) 5 MG immediate release tablet Take 1 tablet (5 mg total) by mouth every 6 (six) hours as needed (moderate to severe pain). (Patient not taking: Reported on 08/15/2016)  . [DISCONTINUED] polyethylene glycol (MIRALAX / GLYCOLAX) packet Take 17 g by mouth daily as needed for mild constipation. (Patient not taking: Reported on 07/29/2016)  . [DISCONTINUED] traMADol (ULTRAM) 50 MG tablet Take 1 tablet (50 mg total) by mouth every 6 (six) hours as needed for moderate pain (or Headache unrelieved by tylenol). (Patient not taking: Reported on 07/29/2016)  . [DISCONTINUED] vitamin C (ASCORBIC ACID) 500 MG tablet Take 500 mg by mouth every morning.    No facility-administered encounter medications on file as of 11/29/2016.      Review of Systems  Unable to perform ROS: Dementia    Vitals:   11/29/16 1441  BP: 124/84  Pulse: 76  Resp: 19  Temp: 98.2 F (36.8 C)  TempSrc: Oral  SpO2: 98%   There is no height or weight on file to calculate BMI. Physical Exam  Constitutional: She appears well-developed and well-nourished.  HENT:  Head: Normocephalic.  Mouth/Throat: Oropharynx is clear and moist.  Eyes: Pupils are equal, round, and reactive to light.  Neck: Neck supple.  Cardiovascular: Normal rate and normal heart sounds.   No murmur heard. Pulmonary/Chest: Effort normal and breath sounds normal.  Has few crackles on Left lower lung  Abdominal: Soft. Bowel sounds are normal. She exhibits no distension. There is no tenderness. There is no rebound.  Musculoskeletal: She exhibits no edema.  Neurological: She is alert.  Did not know Year or date. Nor facility name Has good strength in right upper and Lower Extremities. 3/5 in Left UE and LE  Skin: Skin is warm and dry.  Psychiatric: She has a normal mood and affect. Her behavior is normal. Her speech is rapid and/or pressured. Cognition and memory are  impaired.    Labs reviewed: Basic Metabolic Panel:  Recent Labs  12/05/15 0715 01/09/16 0500 08/19/16 0700  NA 135 138 139  K 4.1 3.7 3.9  CL 99* 105 102  CO2 27 26 27  GLUCOSE 103* 112* 99  BUN 28* 27* 23*  CREATININE 0.77 0.78 0.77  CALCIUM 8.6* 8.6* 9.0   Liver Function Tests:  Recent Labs  01/09/16 0500  AST 27  ALT 25  ALKPHOS 160*  BILITOT 1.1  PROT 5.7*  ALBUMIN 2.9*   No results for input(s): LIPASE, AMYLASE in the last 8760 hours. No results for input(s): AMMONIA in the last 8760 hours. CBC:  Recent Labs  12/05/15 0715 01/09/16 0500 08/19/16 0700  WBC 16.1* 11.1* 7.6  NEUTROABS  --   --  4.8  HGB 12.4 11.5* 13.5  HCT 39.7 36.5 43.0  MCV 85.9 83.0 82.9  PLT 426* 300 138*   Cardiac Enzymes: No results for input(s): CKTOTAL, CKMB, CKMBINDEX, TROPONINI in the last 8760 hours. BNP: Invalid input(s): POCBNP Lab Results  Component Value Date   HGBA1C 5.9 (H) 08/19/2016   Lab Results  Component Value Date   TSH <0.010 (L) 01/09/2016   Lab Results  Component Value Date   [id number] 03/17/2015   No results found for: FOLATE No results found for: IRON, TIBC, FERRITIN  Imaging and Procedures obtained prior to SNF admission: No results found.  Assessment/Plan  Chronic atrial fibrillation  Patient is on low dose Eliquis and stable.rate controlled on metoprolol.  Essential hypertension BP controlled on Losartan and Metoprolol.   Type 2 diabetes mellitus  It is listed as her diagnosis but her last A1C was 5.9 and she is not on any oral hypoglycemics. Will repeat A1C   S/P CVA Thought to be due to A Fib She has been stable on Eliquis.   Dementia with behavioral disturbance Patient is on Xanax, And Remeron and Zoloft and Zyprexa Followed by Psych service . No changes to be made right now. Contiue supportive care  Recurrent UTI On trimethoprim   Family/ staff Communication:   Labs/tests ordered:  CBC, CMP, A1C

## 2016-11-30 ENCOUNTER — Encounter (HOSPITAL_COMMUNITY)
Admission: RE | Admit: 2016-11-30 | Discharge: 2016-11-30 | Disposition: A | Discharge status: Home / Self Care | Payer: Medicare Other | Source: Skilled Nursing Facility | Attending: Internal Medicine | Admitting: Internal Medicine

## 2016-11-30 DIAGNOSIS — F28 Other psychotic disorder not due to a substance or known physiological condition: Secondary | ICD-10-CM | POA: Diagnosis not present

## 2016-11-30 DIAGNOSIS — F0391 Unspecified dementia with behavioral disturbance: Secondary | ICD-10-CM | POA: Insufficient documentation

## 2016-11-30 DIAGNOSIS — R1312 Dysphagia, oropharyngeal phase: Secondary | ICD-10-CM | POA: Insufficient documentation

## 2016-11-30 DIAGNOSIS — E119 Type 2 diabetes mellitus without complications: Secondary | ICD-10-CM | POA: Diagnosis not present

## 2016-11-30 DIAGNOSIS — R279 Unspecified lack of coordination: Secondary | ICD-10-CM | POA: Diagnosis not present

## 2016-11-30 DIAGNOSIS — Z471 Aftercare following joint replacement surgery: Secondary | ICD-10-CM | POA: Insufficient documentation

## 2016-11-30 DIAGNOSIS — M6281 Muscle weakness (generalized): Secondary | ICD-10-CM | POA: Diagnosis not present

## 2016-11-30 DIAGNOSIS — I639 Cerebral infarction, unspecified: Secondary | ICD-10-CM | POA: Diagnosis not present

## 2016-11-30 LAB — COMPREHENSIVE METABOLIC PANEL
ALBUMIN: 3.2 g/dL — AB (ref 3.5–5.0)
ALK PHOS: 115 U/L (ref 38–126)
ALT: 21 U/L (ref 14–54)
ANION GAP: 7 (ref 5–15)
AST: 21 U/L (ref 15–41)
BILIRUBIN TOTAL: 0.7 mg/dL (ref 0.3–1.2)
BUN: 23 mg/dL — ABNORMAL HIGH (ref 6–20)
CALCIUM: 8.8 mg/dL — AB (ref 8.9–10.3)
CO2: 27 mmol/L (ref 22–32)
Chloride: 105 mmol/L (ref 101–111)
Creatinine, Ser: 0.84 mg/dL (ref 0.44–1.00)
GFR calc non Af Amer: 59 mL/min — ABNORMAL LOW (ref >60)
GLUCOSE: 93 mg/dL (ref 65–99)
Potassium: 4 mmol/L (ref 3.5–5.1)
SODIUM: 139 mmol/L (ref 135–145)
TOTAL PROTEIN: 5.7 g/dL — AB (ref 6.5–8.1)

## 2016-11-30 LAB — HEMOGLOBIN A1C
Hgb A1c MFr Bld: 6 % — ABNORMAL HIGH (ref 4.8–5.6)
Mean Plasma Glucose: 125.5 mg/dL

## 2016-11-30 LAB — CBC
HEMATOCRIT: 40.8 % (ref 36.0–46.0)
HEMOGLOBIN: 13.1 g/dL (ref 12.0–15.0)
MCH: 27.1 pg (ref 26.0–34.0)
MCHC: 32.1 g/dL (ref 30.0–36.0)
MCV: 84.5 fL (ref 78.0–100.0)
Platelets: 125 10*3/uL — ABNORMAL LOW (ref 150–400)
RBC: 4.83 MIL/uL (ref 3.87–5.11)
RDW: 14.9 % (ref 11.5–15.5)
WBC: 5.6 10*3/uL (ref 4.0–10.5)

## 2016-11-30 LAB — TSH

## 2016-12-01 ENCOUNTER — Encounter (HOSPITAL_COMMUNITY)
Admission: RE | Admit: 2016-12-01 | Discharge: 2016-12-01 | Disposition: A | Discharge status: Home / Self Care | Payer: Medicare Other | Source: Skilled Nursing Facility | Attending: Internal Medicine | Admitting: Internal Medicine

## 2016-12-01 DIAGNOSIS — R279 Unspecified lack of coordination: Secondary | ICD-10-CM | POA: Diagnosis not present

## 2016-12-01 DIAGNOSIS — I639 Cerebral infarction, unspecified: Secondary | ICD-10-CM | POA: Diagnosis not present

## 2016-12-01 DIAGNOSIS — F0391 Unspecified dementia with behavioral disturbance: Secondary | ICD-10-CM | POA: Insufficient documentation

## 2016-12-01 DIAGNOSIS — Z471 Aftercare following joint replacement surgery: Secondary | ICD-10-CM | POA: Diagnosis not present

## 2016-12-01 DIAGNOSIS — F28 Other psychotic disorder not due to a substance or known physiological condition: Secondary | ICD-10-CM | POA: Diagnosis not present

## 2016-12-01 DIAGNOSIS — R1312 Dysphagia, oropharyngeal phase: Secondary | ICD-10-CM | POA: Diagnosis not present

## 2016-12-01 DIAGNOSIS — M6281 Muscle weakness (generalized): Secondary | ICD-10-CM | POA: Diagnosis not present

## 2016-12-01 LAB — T4, FREE: FREE T4: 1.78 ng/dL — AB (ref 0.61–1.12)

## 2016-12-02 DIAGNOSIS — M6281 Muscle weakness (generalized): Secondary | ICD-10-CM | POA: Diagnosis not present

## 2016-12-02 DIAGNOSIS — F0391 Unspecified dementia with behavioral disturbance: Secondary | ICD-10-CM | POA: Diagnosis not present

## 2016-12-02 DIAGNOSIS — I639 Cerebral infarction, unspecified: Secondary | ICD-10-CM | POA: Diagnosis not present

## 2016-12-02 DIAGNOSIS — R279 Unspecified lack of coordination: Secondary | ICD-10-CM | POA: Diagnosis not present

## 2016-12-02 LAB — T3, FREE: T3 FREE: 4.7 pg/mL — AB (ref 2.0–4.4)

## 2016-12-03 DIAGNOSIS — I639 Cerebral infarction, unspecified: Secondary | ICD-10-CM | POA: Diagnosis not present

## 2016-12-03 DIAGNOSIS — F0391 Unspecified dementia with behavioral disturbance: Secondary | ICD-10-CM | POA: Diagnosis not present

## 2016-12-03 DIAGNOSIS — M6281 Muscle weakness (generalized): Secondary | ICD-10-CM | POA: Diagnosis not present

## 2016-12-03 DIAGNOSIS — R279 Unspecified lack of coordination: Secondary | ICD-10-CM | POA: Diagnosis not present

## 2016-12-06 DIAGNOSIS — I639 Cerebral infarction, unspecified: Secondary | ICD-10-CM | POA: Diagnosis not present

## 2016-12-06 DIAGNOSIS — M6281 Muscle weakness (generalized): Secondary | ICD-10-CM | POA: Diagnosis not present

## 2016-12-06 DIAGNOSIS — R279 Unspecified lack of coordination: Secondary | ICD-10-CM | POA: Diagnosis not present

## 2016-12-06 DIAGNOSIS — F0391 Unspecified dementia with behavioral disturbance: Secondary | ICD-10-CM | POA: Diagnosis not present

## 2016-12-07 DIAGNOSIS — M6281 Muscle weakness (generalized): Secondary | ICD-10-CM | POA: Diagnosis not present

## 2016-12-07 DIAGNOSIS — F0391 Unspecified dementia with behavioral disturbance: Secondary | ICD-10-CM | POA: Diagnosis not present

## 2016-12-07 DIAGNOSIS — I639 Cerebral infarction, unspecified: Secondary | ICD-10-CM | POA: Diagnosis not present

## 2016-12-07 DIAGNOSIS — R279 Unspecified lack of coordination: Secondary | ICD-10-CM | POA: Diagnosis not present

## 2016-12-08 DIAGNOSIS — M6281 Muscle weakness (generalized): Secondary | ICD-10-CM | POA: Diagnosis not present

## 2016-12-08 DIAGNOSIS — R279 Unspecified lack of coordination: Secondary | ICD-10-CM | POA: Diagnosis not present

## 2016-12-08 DIAGNOSIS — I639 Cerebral infarction, unspecified: Secondary | ICD-10-CM | POA: Diagnosis not present

## 2016-12-08 DIAGNOSIS — F0391 Unspecified dementia with behavioral disturbance: Secondary | ICD-10-CM | POA: Diagnosis not present

## 2016-12-20 ENCOUNTER — Non-Acute Institutional Stay (SKILLED_NURSING_FACILITY): Payer: Medicare Other

## 2016-12-20 DIAGNOSIS — Z0000 Encounter for general adult medical examination without abnormal findings: Secondary | ICD-10-CM

## 2016-12-20 NOTE — Patient Instructions (Signed)
[name] , Thank you for taking time to come for your Medicare Wellness Visit. I appreciate your ongoing commitment to your health goals. Please review the following plan we discussed and let me know if I can assist you in the future.   Screening recommendations/referrals: Colonoscopy excluded, pt over age 81 Mammogram excluded, pt over age 35 Bone Density due, not ordered due to pt age Recommended yearly ophthalmology/optometry visit for glaucoma screening and checkup Recommended yearly dental visit for hygiene and checkup  Vaccinations: Influenza vaccine due Pneumococcal vaccine up to date. PNA 13 due 12/29/16 Tdap vaccine due Shingles vaccine not in records    Advanced directives: Need a copy for chart  Conditions/risks identified: none  Next appointment: Dr. Lyndel Safe makes rounds   Preventive Care 65 Years and Older, Female Preventive care refers to lifestyle choices and visits with your health care provider that can promote health and wellness. What does preventive care include?  A yearly physical exam. This is also called an annual well check.  Dental exams once or twice a year.  Routine eye exams. Ask your health care provider how often you should have your eyes checked.  Personal lifestyle choices, including:  Daily care of your teeth and gums.  Regular physical activity.  Eating a healthy diet.  Avoiding tobacco and drug use.  Limiting alcohol use.  Practicing safe sex.  Taking low-dose aspirin every day.  Taking vitamin and mineral supplements as recommended by your health care provider. What happens during an annual well check? The services and screenings done by your health care provider during your annual well check will depend on your age, overall health, lifestyle risk factors, and family history of disease. Counseling  Your health care provider may ask you questions about your:  Alcohol use.  Tobacco use.  Drug use.  Emotional  well-being.  Home and relationship well-being.  Sexual activity.  Eating habits.  History of falls.  Memory and ability to understand (cognition).  Work and work Statistician.  Reproductive health. Screening  You may have the following tests or measurements:  Height, weight, and BMI.  Blood pressure.  Lipid and cholesterol levels. These may be checked every 5 years, or more frequently if you are over 22 years old.  Skin check.  Lung cancer screening. You may have this screening every year starting at age 110 if you have a 30-pack-year history of smoking and currently smoke or have quit within the past 15 years.  Fecal occult blood test (FOBT) of the stool. You may have this test every year starting at age 21.  Flexible sigmoidoscopy or colonoscopy. You may have a sigmoidoscopy every 5 years or a colonoscopy every 10 years starting at age 40.  Hepatitis C blood test.  Hepatitis B blood test.  Sexually transmitted disease (STD) testing.  Diabetes screening. This is done by checking your blood sugar (glucose) after you have not eaten for a while (fasting). You may have this done every 1-3 years.  Bone density scan. This is done to screen for osteoporosis. You may have this done starting at age 44.  Mammogram. This may be done every 1-2 years. Talk to your health care provider about how often you should have regular mammograms. Talk with your health care provider about your test results, treatment options, and if necessary, the need for more tests. Vaccines  Your health care provider may recommend certain vaccines, such as:  Influenza vaccine. This is recommended every year.  Tetanus, diphtheria, and acellular pertussis (  Tdap, Td) vaccine. You may need a Td booster every 10 years.  Zoster vaccine. You may need this after age 13.  Pneumococcal 13-valent conjugate (PCV13) vaccine. One dose is recommended after age 62.  Pneumococcal polysaccharide (PPSV23) vaccine. One  dose is recommended after age 76. Talk to your health care provider about which screenings and vaccines you need and how often you need them. This information is not intended to replace advice given to you by your health care provider. Make sure you discuss any questions you have with your health care provider. Document Released: 04/04/2015 Document Revised: 11/26/2015 Document Reviewed: 01/07/2015 Elsevier Interactive Patient Education  2017 [place] Prevention in the Home Falls can cause injuries. They can happen to people of all ages. There are many things you can do to make your home safe and to help prevent falls. What can I do on the outside of my home?  Regularly fix the edges of walkways and driveways and fix any cracks.  Remove anything that might make you trip as you walk through a door, such as a raised step or threshold.  Trim any bushes or trees on the path to your home.  Use bright outdoor lighting.  Clear any walking paths of anything that might make someone trip, such as rocks or tools.  Regularly check to see if handrails are loose or broken. Make sure that both sides of any steps have handrails.  Any raised decks and porches should have guardrails on the edges.  Have any leaves, snow, or ice cleared regularly.  Use sand or salt on walking paths during winter.  Clean up any spills in your garage right away. This includes oil or grease spills. What can I do in the bathroom?  Use night lights.  Install grab bars by the toilet and in the tub and shower. Do not use towel bars as grab bars.  Use non-skid mats or decals in the tub or shower.  If you need to sit down in the shower, use a plastic, non-slip stool.  Keep the floor dry. Clean up any water that spills on the floor as soon as it happens.  Remove soap buildup in the tub or shower regularly.  Attach bath mats securely with double-sided non-slip rug tape.  Do not have throw rugs and other  things on the floor that can make you trip. What can I do in the bedroom?  Use night lights.  Make sure that you have a light by your bed that is easy to reach.  Do not use any sheets or blankets that are too big for your bed. They should not hang down onto the floor.  Have a firm chair that has side arms. You can use this for support while you get dressed.  Do not have throw rugs and other things on the floor that can make you trip. What can I do in the kitchen?  Clean up any spills right away.  Avoid walking on wet floors.  Keep items that you use a lot in easy-to-reach places.  If you need to reach something above you, use a strong step stool that has a grab bar.  Keep electrical cords out of the way.  Do not use floor polish or wax that makes floors slippery. If you must use wax, use non-skid floor wax.  Do not have throw rugs and other things on the floor that can make you trip. What can I do with my stairs?  Do  not leave any items on the stairs.  Make sure that there are handrails on both sides of the stairs and use them. Fix handrails that are broken or loose. Make sure that handrails are as long as the stairways.  Check any carpeting to make sure that it is firmly attached to the stairs. Fix any carpet that is loose or worn.  Avoid having throw rugs at the top or bottom of the stairs. If you do have throw rugs, attach them to the floor with carpet tape.  Make sure that you have a light switch at the top of the stairs and the bottom of the stairs. If you do not have them, ask someone to add them for you. What else can I do to help prevent falls?  Wear shoes that:  Do not have high heels.  Have rubber bottoms.  Are comfortable and fit you well.  Are closed at the toe. Do not wear sandals.  If you use a stepladder:  Make sure that it is fully opened. Do not climb a closed stepladder.  Make sure that both sides of the stepladder are locked into place.  Ask  someone to hold it for you, if possible.  Clearly mark and make sure that you can see:  Any grab bars or handrails.  First and last steps.  Where the edge of each step is.  Use tools that help you move around (mobility aids) if they are needed. These include:  Canes.  Walkers.  Scooters.  Crutches.  Turn on the lights when you go into a dark area. Replace any light bulbs as soon as they burn out.  Set up your furniture so you have a clear path. Avoid moving your furniture around.  If any of your floors are uneven, fix them.  If there are any pets around you, be aware of where they are.  Review your medicines with your doctor. Some medicines can make you feel dizzy. This can increase your chance of falling. Ask your doctor what other things that you can do to help prevent falls. This information is not intended to replace advice given to you by your health care provider. Make sure you discuss any questions you have with your health care provider. Document Released: 01/02/2009 Document Revised: 08/14/2015 Document Reviewed: 04/12/2014 Elsevier Interactive Patient Education  2017 [employer].

## 2016-12-20 NOTE — Progress Notes (Signed)
Subjective:   Caroline Aguirre is a 83 y.o. female who presents for an Initial Medicare Annual Wellness Visit at [facility]; incapacitated patient unable to answer questions appropriately        Objective:    Today's Vitals   12/20/16 1218  BP: 120/78  Pulse: 71  Temp: 97.8 F (36.6 C)  TempSrc: Oral  SpO2: 98%  Weight: 120 lb (54.4 kg)  Height: 5\' 3"  (1.6 m)   Body mass index is 21.26 kg/m.   Current Medications (verified) Outpatient Encounter Prescriptions as of 12/20/2016  Medication Sig  . ALPRAZolam (XANAX) 0.25 MG tablet Take 0.25 mg by mouth at bedtime as needed for anxiety. Give 1 tablet by mouth three times a day  . apixaban (ELIQUIS) 2.5 MG TABS tablet Take 1 tablet (2.5 mg total) by mouth 2 (two) times daily.  Roseanne Kaufman Peru-Castor Oil (VENELEX) OINT Apply to sacrum and bilateral buttocks q shift and prn  . gabapentin (NEURONTIN) 100 MG capsule Take 1 capsule (100 mg total) by mouth 2 (two) times daily.  Marland Kitchen losartan (COZAAR) 50 MG tablet Take 1 tablet (50 mg total) by mouth daily.  . metoprolol 75 MG TABS Take 75 mg by mouth 2 (two) times daily.  . mirtazapine (REMERON) 7.5 MG tablet Take 7.5 mg by mouth at bedtime.  . NON FORMULARY 1 neb inhalation four times a dayprn  . OLANZapine (ZYPREXA) 5 MG tablet Take 5 mg by mouth at bedtime.  . pantoprazole (PROTONIX) 40 MG tablet Take 1 tablet (40 mg total) by mouth 2 (two) times daily.  Marland Kitchen senna (SENOKOT) 8.6 MG TABS tablet Take 1 tablet by mouth at bedtime.  . sertraline (ZOLOFT) 50 MG tablet Take 50 mg by mouth daily.  Marland Kitchen trimethoprim (TRIMPEX) 100 MG tablet Take 100 mg by mouth at bedtime.   No facility-administered encounter medications on file as of 12/20/2016.     Allergies (verified) Tape; Ciprofloxacin; Sulfa antibiotics; Codeine; and Morphine and related   History: Past Medical History:  Diagnosis Date  . Arthritis   . Cancer ([place])    Rt Breast  . Coronary artery disease   .  Cyst of breast    Right - at 89 yrs of age.  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 Diabetes mellitus without complication ([place])   . Diverticulitis   . Elevated cholesterol   . GERD (gastroesophageal reflux disease)   . Hardening of the arteries of the brain   . Heart disease   . Herniated disc   . Hx Breast cancer, DCIS, Right 08/19/2010  . Hypertension   . Migraine headache   . SBO (small bowel obstruction) ([place]) 09/02/11  . Shingles   . Stroke [facility]) 2004   cerebral hemmorhage   Past Surgical History:  Procedure Laterality Date  . BRAIN SURGERY     hemorrhage  . BREAST SURGERY  09/25/2010   mastectomy  . CYSTECTOMY    . ESOPHAGOGASTRODUODENOSCOPY N/A 11/13/2015   Procedure: ESOPHAGOGASTRODUODENOSCOPY (EGD);  Surgeon: Rogene [place], MD;  Location: [facility];  Service: Endoscopy;  Laterality: N/A;  . fibroid tumors    . KNEE ARTHROSCOPY Right    right knee x 3  . LAPAROSCOPIC BILATERAL SALPINGO OOPHERECTOMY    . SPINE SURGERY     repair herniated disc  . TOTAL HIP ARTHROPLASTY Right 11/26/2015   Procedure: HEMI HIP ARTHROPLASTY ANTERIOR APPROACH;  Surgeon:  Rod Can, MD;  Location: [facility];  Service: Orthopedics;  Laterality: Right;   Family History  Problem Relation Age of Onset  . Cancer Mother   . Heart disease Father   . Spinal muscular atrophy Sister        spinal meningitis   Social History   Occupational History  . Not on file.   Social History Main Topics  . Smoking status: Never Smoker  . Smokeless tobacco: Never Used  . Alcohol use No  . Drug use: No  . Sexual activity: Not on file    Tobacco Counseling Counseling given: Not Answered   Activities of Daily Living In your present state of health, do you have any difficulty performing the following activities: 12/20/2016  Hearing? Y  Vision? Y  Difficulty concentrating or making decisions? Y  Walking or climbing stairs? Y  Dressing or bathing? Y    Doing errands, shopping? Y  Preparing Food and eating ? Y  Using the Toilet? Y  In the past six months, have you accidently leaked urine? Y  Do you have problems with loss of bowel control? Y  Managing your Medications? Y  Managing your Finances? Y  Housekeeping or managing your Housekeeping? Y  Some recent data might be hidden    Immunizations and Health Maintenance  There is no immunization history on file for this patient. There are no preventive care reminders to display for this patient.  Patient Care Team: Virgie Dad, MD as PCP - General (Internal Medicine) Neldon Mc, MD (General Surgery) Jonnie Kind, MD as Consulting Physician (Obstetrics and Gynecology) Wille Celeste, PA-C as Physician Assistant (Internal Medicine)  Indicate any recent Medical Services you may have received from other than [facility] providers in the past year (date may be approximate).     Assessment:   This is a routine wellness examination for Caroline Aguirre.   Hearing/Vision screen No exam data present  Dietary issues and exercise activities discussed: Current Exercise Habits: The patient does not participate in regular exercise at present, Exercise limited by: neurologic condition(s);orthopedic condition(s)  Goals    None     Depression Screen PHQ 2/9 Scores 12/20/2016  PHQ - 2 Score 1    Fall Risk Fall Risk  12/20/2016  Falls in the past year? Yes  Number falls in past yr: 2 or more  Injury with Fall? Yes    Cognitive Function: MMSE - Mini Mental State Exam 12/20/2016  Not completed: Unable to complete        Screening Tests Health Maintenance  Topic Date Due  . INFLUENZA VACCINE  02/19/2017 (Originally 10/20/2016)  . FOOT EXAM  02/19/2017 (Originally 03/22/1934)  . OPHTHALMOLOGY EXAM  02/19/2017 (Originally 03/22/1934)  . URINE MICROALBUMIN  02/19/2017 (Originally 03/22/1934)  . DEXA SCAN  02/19/2017 (Originally 03/23/1991)  . TETANUS/TDAP  02/19/2017 (Originally 03/22/1945)  .  PNA vac Low Risk Adult (1 of 2 - PCV13) 02/19/2017 (Originally 03/23/1991)  . HEMOGLOBIN A1C  05/30/2017      Plan:    I have personally reviewed and addressed the Medicare Annual Wellness questionnaire and have noted the following in the patient's chart:  A. Medical and social history B. Use of alcohol, tobacco or illicit drugs  C. Current medications and supplements D. Functional ability and status E.  Nutritional status F.  Physical activity G. Advance directives H. List of other physicians I.  Hospitalizations, surgeries, and ER visits in previous 12 months J.  Vitals K. Screenings to include hearing,  vision, cognitive, depression L. Referrals and appointments - none  In addition, I am unable to review and discuss with incapacitated patient certain preventive protocols, quality metrics, and best practice recommendations. A written personalized care plan for preventive services as well as general preventive health recommendations were provided to patient.High   See attached scanned questionnaire for additional information.   Signed,   Rich Reining, RN Nurse Health Advisor   Quick Notes   Health Maintenance: Flu vaccine, eye exam, microalbumin, tdap, dexa due. DEXA not ordered due to pt age     Abnormal Screen: Unable to complete mental exam due to dementia.     Patient Concerns: None     Nurse Concerns: None

## 2016-12-21 ENCOUNTER — Other Ambulatory Visit (HOSPITAL_COMMUNITY)
Admission: AD | Admit: 2016-12-21 | Discharge: 2016-12-21 | Disposition: A | Discharge status: Home / Self Care | Payer: Medicare Other | Source: Skilled Nursing Facility | Attending: Internal Medicine | Admitting: Internal Medicine

## 2016-12-21 DIAGNOSIS — R69 Illness, unspecified: Secondary | ICD-10-CM | POA: Diagnosis not present

## 2016-12-22 LAB — MICROALBUMIN, URINE: Microalb, Ur: 11.7 ug/mL — ABNORMAL HIGH

## 2016-12-23 ENCOUNTER — Encounter: Payer: Self-pay | Admitting: "Endocrinology

## 2016-12-23 ENCOUNTER — Ambulatory Visit (INDEPENDENT_AMBULATORY_CARE_PROVIDER_SITE_OTHER): Payer: Medicare Other | Admitting: "Endocrinology

## 2016-12-23 VITALS — BP 126/84 | HR 76 | Ht 63.0 in

## 2016-12-23 DIAGNOSIS — E0590 Thyrotoxicosis, unspecified without thyrotoxic crisis or storm: Secondary | ICD-10-CM

## 2016-12-23 MED ORDER — METHIMAZOLE 5 MG PO TABS: 5.0000 mg | ORAL_TABLET | Freq: Every day | ORAL | 3 refills | Status: DC

## 2016-12-23 NOTE — Progress Notes (Signed)
Subjective:    Patient ID: Caroline Aguirre, female    DOB: Nov 17, 1924, PCP Virgie Dad, MD.   Past Medical History:  Diagnosis Date  . Arthritis   . Cancer ([place])    Rt Breast  . Coronary artery disease   . Cyst of breast    Right - at 9 yrs of age.  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 Diabetes mellitus without complication ([place])   . Diverticulitis   . Elevated cholesterol   . GERD (gastroesophageal reflux disease)   . Hardening of the arteries of the brain   . Heart disease   . Herniated disc   . Hx Breast cancer, DCIS, Right 08/19/2010  . Hypertension   . Migraine headache   . SBO (small bowel obstruction) ([place]) 09/02/11  . Shingles   . Stroke [facility]) 2004   cerebral hemmorhage   Past Surgical History:  Procedure Laterality Date  . BRAIN SURGERY     hemorrhage  . BREAST SURGERY  09/25/2010   mastectomy  . CYSTECTOMY    . ESOPHAGOGASTRODUODENOSCOPY N/A 11/13/2015   Procedure: ESOPHAGOGASTRODUODENOSCOPY (EGD);  Surgeon: Rogene [place], MD;  Location[facility];  Service: Endoscopy;  Laterality: N/A;  . fibroid tumors    . KNEE ARTHROSCOPY Right    right knee x 3  . LAPAROSCOPIC BILATERAL SALPINGO OOPHERECTOMY    . SPINE SURGERY     repair herniated disc  . TOTAL HIP ARTHROPLASTY Right 11/26/2015   Procedure: HEMI HIP ARTHROPLASTY ANTERIOR APPROACH;  Surgeon: Rod Can, MD;  Location: [facility];  Service: Orthopedics;  Laterality: Right;   Social History   Social History  . Marital status: Divorced    Spouse name: N/A  . Number of children: N/A  . Years of education: N/A   Social History Main Topics  . Smoking status: Never Smoker  . Smokeless tobacco: Never Used  . Alcohol use No  . Drug use: No  . Sexual activity: Not Asked   Other Topics Concern  . None   Social History Narrative  . None   Outpatient Encounter Prescriptions as of 12/23/2016  Medication Sig  . ALPRAZolam (XANAX) 0.25 MG tablet  Take 0.25 mg by mouth at bedtime as needed for anxiety. Give 1 tablet by mouth three times a day  . apixaban (ELIQUIS) 2.5 MG TABS tablet Take 1 tablet (2.5 mg total) by mouth 2 (two) times daily.  Roseanne Kaufman Peru-Castor Oil (VENELEX) OINT Apply to sacrum and bilateral buttocks q shift and prn  . gabapentin (NEURONTIN) 100 MG capsule Take 1 capsule (100 mg total) by mouth 2 (two) times daily.  Marland Kitchen losartan (COZAAR) 50 MG tablet Take 1 tablet (50 mg total) by mouth daily.  . methimazole (TAPAZOLE) 5 MG tablet Take 1 tablet (5 mg total) by mouth daily.  . metoprolol 75 MG TABS Take 75 mg by mouth 2 (two) times daily.  . mirtazapine (REMERON) 7.5 MG tablet Take 7.5 mg by mouth at bedtime.  . NON FORMULARY 1 neb inhalation four times a dayprn  . OLANZapine (ZYPREXA) 5 MG tablet Take 5 mg by mouth at bedtime.  . pantoprazole (PROTONIX) 40 MG tablet Take 1 tablet (40 mg total) by mouth 2 (two) times daily.  Marland Kitchen senna (SENOKOT) 8.6 MG TABS tablet Take 1 tablet by mouth at bedtime.  . sertraline (ZOLOFT) 50 MG tablet Take 50 mg by mouth daily.  Marland Kitchen  trimethoprim (TRIMPEX) 100 MG tablet Take 100 mg by mouth at bedtime.   No facility-administered encounter medications on file as of 12/23/2016.     ALLERGIES: Allergies  Allergen Reactions  . Tape Rash  . Ciprofloxacin   . Sulfa Antibiotics Hives  . Codeine Nausea Only    nausea.  . Morphine And Related Nausea Only    Nausea only.    VACCINATION STATUS: Immunization History  Administered Date(s) Administered  . Pneumococcal Polysaccharide-23 12/30/2015     HPI  Caroline Aguirre is 83 y.o. female who presents today with a medical history as above. she is being seen in consultation for hyperthyroidism requested by Virgie Dad, MD.  She is elderly in nursing home. She is not a good historian, accompanied by her sitter and POA. she has been dealing with symptoms of  anxiety, lack of sleep, hallucinations,  for several weeks. - Lab work from  11/30/2016 showed significantly suppressed TSH 59f <0.01, elevated free T3 of 4.7 and elevated free T4 of 1.78. - There is no record of prior thyroid dysfunction in her referral package. she denies dysphagia, choking, shortness of breath, not recent voice change.   I could not elicit family history. she is not on any anti-thyroid medications nor on any thyroid hormone supplements.   Constitutional: - weight loss, + fatigue, + subjective hyperthermia Eyes: no blurry vision, - xerophthalmia ENT: no sore throat, no nodules palpated in throat, no dysphagia/odynophagia, nor hoarseness Cardiovascular: no Chest Pain, no Shortness of Breath, -  Palpitations ( she is on beta blocker), no leg swelling Respiratory: no cough, no SOB Gastrointestinal: no Nausea, no Vomiting, no Diarhhea Musculoskeletal: no muscle/joint aches Skin: no rashes Neurological: ++  tremors, no numbness, no tingling, no dizziness Psychiatric: no depression, ++  anxiety   Objective:    BP 126/84   Pulse 76   Ht 5\' 3"  (1.6 m)   Wt Readings from Last 3 Encounters:  12/20/16 120 lb (54.4 kg)  11/23/15 120 lb (54.4 kg)  11/14/15 118 lb 6.2 oz (53.7 kg)     Constitutional: + light build, + on wheelchair, + nonverbal, not in acute distress, + anxious state of mind Eyes: PERRLA, EOMI, - exophthalmos ENT: moist mucous membranes, -  thyromegaly, no cervical lymphadenopathy Cardiovascular: ++ Normal precordial activity, + control Rate and Rhythm, no Murmur/Rubs/Gallops Respiratory:  adequate breathing efforts, no gross chest deformity, Clear to auscultation bilaterally Gastrointestinal: abdomen soft, Non -tender, No distension, Bowel Sounds present Musculoskeletal: no gross deformities, strength intact in all four extremities Skin: moist, warm, no rashes Neurological: ++  tremor with outstretched hands,  ++ Deep Tendon Reflexes  on both lower extremities.   CMP     Component Value Date/Time   NA 139 11/30/2016 0702   K  4.0 11/30/2016 0702   CL 105 11/30/2016 0702   CO2 27 11/30/2016 0702   GLUCOSE 93 11/30/2016 0702   BUN 23 (H) 11/30/2016 0702   CREATININE 0.84 11/30/2016 0702   CREATININE 1.27 (H) 09/04/2015 1413   CALCIUM 8.8 (L) 11/30/2016 0702   PROT 5.7 (L) 11/30/2016 0702   ALBUMIN 3.2 (L) 11/30/2016 0702   AST 21 11/30/2016 0702   ALT 21 11/30/2016 0702   ALKPHOS 115 11/30/2016 0702   BILITOT 0.7 11/30/2016 0702   GFRNONAA 59 (L) 11/30/2016 0702   GFRAA >60 11/30/2016 0702     CBC    Component Value Date/Time   WBC 5.6 11/30/2016 0702   RBC 4.83 11/30/2016 3149  HGB 13.1 11/30/2016 0702   HCT 40.8 11/30/2016 0702   PLT 125 (L) 11/30/2016 0702   MCV 84.5 11/30/2016 0702   MCH 27.1 11/30/2016 0702   MCHC 32.1 11/30/2016 0702   RDW 14.9 11/30/2016 0702   LYMPHSABS 1.6 08/19/2016 0700   MONOABS 0.9 08/19/2016 0700   EOSABS 0.3 08/19/2016 0700   BASOSABS 0.0 08/19/2016 0700     Diabetic Labs (most recent): Lab Results  Component Value Date   HGBA1C 6.0 (H) 11/30/2016   HGBA1C 5.9 (H) 08/19/2016   HGBA1C 6.4 (H) 03/17/2015    Lipid Panel     Component Value Date/Time   CHOL 118 09/29/2016 0830   TRIG 68 09/29/2016 0830   HDL 31 (L) 09/29/2016 0830   CHOLHDL 3.8 09/29/2016 0830   VLDL 14 09/29/2016 0830   LDLCALC 73 09/29/2016 0830   Results for Caroline Aguirre, Caroline Aguirre (MRN [id number]) as of 12/23/2016 13:53  Ref. Range 11/30/2016 07:02 12/01/2016 07:07  TSH Latest Ref Range: 0.350 - 4.500 uIU/mL <0.010 (L)   Triiodothyronine,Free,Serum Latest Ref Range: 2.0 - 4.4 pg/mL  4.7 (H)  T4,Free(Direct) Latest Ref Range: 0.61 - 1.12 ng/dL  1.78 (H)    Assessment & Plan:   1. Hyperthyroidism  she is being seen at a kind request of Virgie Dad, MD. her history and most recent labs are reviewed, and she was examined clinically.   She is not a good historian, nonverbal. - Reported Subjective and objective findings are consistent with thyrotoxicosis likely from primary  hyperthyroidism. The potential risks of untreated thyrotoxicosis and the need for definitive therapy have been discussed  with her sitter and POA. - Typically, she wouldn't need thyroid uptake and scan with plan to deliver ablative therapy with radioactive iodine. - However, in her case, it is preferable to proceed directly with antithyroid therapy with low-dose methimazole with close monitoring. - I will initiate methimazole 5 mg by mouth daily with plan to repeat thyroid function test in 3 months with clinic visit.  - She is already on beta blocker involving metoprolol 75 mg by mouth twice a day, did not initiate any other beta blocker.  - I advised her to maintain close follow up with Virgie Dad, MD for primary care needs.  Follow up plan: Return in about 3 months (around 03/25/2017) for follow up with pre-visit labs.   Thank you for involving me in the care of this pleasant patient, and I will continue to update you with her progress.  Glade Lloyd, MD [facility] Phone: [phone]  Fax: [phone]   12/23/2016, 2:02 PM  This note was partially dictated with voice recognition software. Similar sounding words can be transcribed inadequately or may not  be corrected upon review.

## 2016-12-24 ENCOUNTER — Other Ambulatory Visit: Payer: Self-pay | Admitting: "Endocrinology

## 2016-12-24 MED ORDER — METHIMAZOLE 5 MG PO TABS
5.0000 mg | ORAL_TABLET | Freq: Every day | ORAL | 3 refills | Status: AC
Start: 2016-12-24 — End: ?

## 2016-12-28 ENCOUNTER — Encounter (HOSPITAL_COMMUNITY)
Admission: RE | Admit: 2016-12-28 | Discharge: 2016-12-28 | Disposition: A | Discharge status: Home / Self Care | Payer: Medicare Other | Source: Skilled Nursing Facility | Attending: Internal Medicine | Admitting: Internal Medicine

## 2016-12-28 DIAGNOSIS — E119 Type 2 diabetes mellitus without complications: Secondary | ICD-10-CM | POA: Insufficient documentation

## 2016-12-28 DIAGNOSIS — F28 Other psychotic disorder not due to a substance or known physiological condition: Secondary | ICD-10-CM | POA: Insufficient documentation

## 2016-12-28 DIAGNOSIS — Z471 Aftercare following joint replacement surgery: Secondary | ICD-10-CM | POA: Diagnosis not present

## 2016-12-28 DIAGNOSIS — R1312 Dysphagia, oropharyngeal phase: Secondary | ICD-10-CM | POA: Diagnosis not present

## 2016-12-28 DIAGNOSIS — F0391 Unspecified dementia with behavioral disturbance: Secondary | ICD-10-CM | POA: Diagnosis not present

## 2016-12-28 LAB — URINALYSIS, ROUTINE W REFLEX MICROSCOPIC
Bilirubin Urine: NEGATIVE
Glucose, UA: NEGATIVE mg/dL
HGB URINE DIPSTICK: NEGATIVE
Ketones, ur: NEGATIVE mg/dL
Nitrite: POSITIVE — AB
PROTEIN: NEGATIVE mg/dL
Specific Gravity, Urine: 1.019 (ref 1.005–1.030)
pH: 7 (ref 5.0–8.0)

## 2016-12-29 DIAGNOSIS — R0989 Other specified symptoms and signs involving the circulatory and respiratory systems: Secondary | ICD-10-CM | POA: Diagnosis not present

## 2016-12-29 DIAGNOSIS — R05 Cough: Secondary | ICD-10-CM | POA: Diagnosis not present

## 2016-12-30 ENCOUNTER — Encounter: Payer: Self-pay | Admitting: Internal Medicine

## 2016-12-30 ENCOUNTER — Non-Acute Institutional Stay (SKILLED_NURSING_FACILITY): Payer: Medicare Other | Admitting: Internal Medicine

## 2016-12-30 DIAGNOSIS — E118 Type 2 diabetes mellitus with unspecified complications: Secondary | ICD-10-CM | POA: Diagnosis not present

## 2016-12-30 DIAGNOSIS — I10 Essential (primary) hypertension: Secondary | ICD-10-CM | POA: Diagnosis not present

## 2016-12-30 DIAGNOSIS — E0590 Thyrotoxicosis, unspecified without thyrotoxic crisis or storm: Secondary | ICD-10-CM

## 2016-12-30 DIAGNOSIS — I482 Chronic atrial fibrillation, unspecified: Secondary | ICD-10-CM

## 2016-12-30 DIAGNOSIS — F0391 Unspecified dementia with behavioral disturbance: Secondary | ICD-10-CM

## 2016-12-30 NOTE — Progress Notes (Signed)
Location:   [facility] Room Number: 102/P Place of Service:  SNF (31) Provider:  Freddi Starr, MD  Patient Care Team: Virgie Dad, MD as PCP - General (Internal Medicine) Neldon Mc, MD (General Surgery) Jonnie Kind, MD as Consulting Physician (Obstetrics and Gynecology) Wille Celeste, PA-C as Physician Assistant (Internal Medicine)  Extended Emergency Contact Information Primary Emergency Contact: Alferd Patee, [place] [address] Johnnette Litter of [place] Phone: [phone] Mobile Phone: [phone] Relation: Relative Secondary Emergency Contact: Nevada Crane Address: [address]          [place], [place] [address] Johnnette Litter of [place] Phone: [phone] Relation: Son  Code Status:  DNR Goals of care: Advanced Directive information Advanced Directives 12/30/2016  Does Patient Have a Medical Advance Directive? Yes  Type of Advance Directive Out of facility DNR (pink MOST or yellow form)  Does patient want to make changes to medical advance directive? No - Patient declined  Copy of [place] in Chart? -  Would patient like information on creating a medical advance directive? -  Pre-existing out of facility DNR order (yellow form or pink MOST form) -     Chief Complaint  Patient presents with  . Medical Management of Chronic Issues    Routine Visit  For medical management of chronic medical issues including dementia-atrial fibrillation-hyperthyroidism-history of CVA-dementia--history of hip fracture in the past as well as recurrent falls-GI bleed and chronic kidney disease.  -  HPI:  Pt is a 83 y.o. female seen today for medical management of chronic diseases.  As noted above.  Most recent acute issue with some increased confusion she has had a urine culture which is grown out greater than 100,000 colonies we are waiting final culture results.  She has been afebrile.  She does  have a history of significant dementia with behaviors  She is on Xanax as well as Remeron and Zoloft and Zyprexa and followed by psychiatric services behaviors appear to be more apparent in the late afternoon.  This has been somewhat challenging.  She also has a history recently of hyperthyroidism with a depressed TSH she has been seen by endocrinology and has been started on Tapazole and they are following labs.  She does have a history of-relation she is on Eliquis for anticoagulation as well as metoprolol for rate control this appears to be stable.  She also had a listed history of type 2 diabetes hemoglobin A1c 6.0 on lab done last month she's not on any medication.  Currently she is sitting comfortably in her wheelchair she does have a sitter with her large part of the day.         Past Medical History:  Diagnosis Date  . Arthritis   . Cancer ([place])    Rt Breast  . Coronary artery disease   . Cyst of breast    Right - at 63 yrs of age.  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 Diabetes mellitus without complication ([place])   . Diverticulitis   . Elevated cholesterol   . GERD (gastroesophageal reflux disease)   . Hardening of the arteries of the brain   . Heart disease   . Herniated disc   . Hx Breast cancer, DCIS, Right 08/19/2010  . Hypertension   . Migraine headache   . SBO (small bowel obstruction) ([place])  09/02/11  . Shingles   . Stroke [facility]) 2004   cerebral hemmorhage   Past Surgical History:  Procedure Laterality Date  . BRAIN SURGERY     hemorrhage  . BREAST SURGERY  09/25/2010   mastectomy  . CYSTECTOMY    . ESOPHAGOGASTRODUODENOSCOPY N/A 11/13/2015   Procedure: ESOPHAGOGASTRODUODENOSCOPY (EGD);  Surgeon: Rogene Houston, MD;  [facility];  Service: Endoscopy;  Laterality: N/A;  . fibroid tumors    . KNEE ARTHROSCOPY Right    right knee x 3  . LAPAROSCOPIC BILATERAL SALPINGO OOPHERECTOMY    . SPINE  SURGERY     repair herniated disc  . TOTAL HIP ARTHROPLASTY Right 11/26/2015   Procedure: HEMI HIP ARTHROPLASTY ANTERIOR APPROACH;  Surgeon: Rod Can, MD;  Location: [facility] OR[facility];  Service: Orthopedics;  Laterality: Right;    Allergies  Allergen Reactions  . Tape Rash  . Ciprofloxacin   . Sulfa Antibiotics Hives  . Codeine Nausea Only    nausea.  . Morphine And Related Nausea Only    Nausea only.    Outpatient Encounter Prescriptions as of 12/30/2016  Medication Sig  . acetaminophen (TYLENOL) 325 MG tablet Take 650 mg by mouth every 6 (six) hours as needed.  . ALPRAZolam (XANAX) 0.25 MG tablet Take 0.25 mg by mouth at bedtime as needed for anxiety. Give 1 tablet by mouth three times a day  . apixaban (ELIQUIS) 2.5 MG TABS tablet Take 1 tablet (2.5 mg total) by mouth 2 (two) times daily.  Roseanne Kaufman Peru-Castor Oil (VENELEX) OINT Apply to sacrum and bilateral buttocks q shift and prn  . Dextromethorphan-Guaifenesin 10-100 MG/5ML liquid Take 15 mLs by mouth every 6 (six) hours as needed.  . gabapentin (NEURONTIN) 100 MG capsule Take 1 capsule (100 mg total) by mouth 2 (two) times daily.  Marland Kitchen ipratropium-albuterol (DUONEB) 0.5-2.5 (3) MG/3ML SOLN Take 3 mLs by nebulization 4 (four) times daily as needed.  Marland Kitchen losartan (COZAAR) 50 MG tablet Take 1 tablet (50 mg total) by mouth daily.  . methimazole (TAPAZOLE) 5 MG tablet Take 1 tablet (5 mg total) by mouth daily.  . metoprolol 75 MG TABS Take 75 mg by mouth 2 (two) times daily.  . mirtazapine (REMERON) 7.5 MG tablet Take 7.5 mg by mouth at bedtime.  Marland Kitchen OLANZapine (ZYPREXA) 5 MG tablet Take 5 mg by mouth at bedtime.  . pantoprazole (PROTONIX) 40 MG tablet Take 40 mg by mouth daily.  Marland Kitchen senna (SENOKOT) 8.6 MG TABS tablet Take 1 tablet by mouth at bedtime.  . sertraline (ZOLOFT) 50 MG tablet Take 50 mg by mouth daily.  Marland Kitchen trimethoprim (TRIMPEX) 100 MG tablet Take 100 mg by mouth at bedtime.  . [DISCONTINUED] NON FORMULARY 1 neb inhalation four times  a dayprn  . [DISCONTINUED] pantoprazole (PROTONIX) 40 MG tablet Take 1 tablet (40 mg total) by mouth 2 (two) times daily. (Patient taking differently: Take 40 mg by mouth daily. )   No facility-administered encounter medications on file as of 12/30/2016.      Review of Systems   This is essentially unattainable secondary to dementia  Immunization History  Administered Date(s) Administered  . Influenza-Unspecified 12/24/2016  . Pneumococcal Conjugate-13 12/24/2016  . Pneumococcal Polysaccharide-23 12/30/2015   Pertinent  Health Maintenance Due  Topic Date Due  . OPHTHALMOLOGY EXAM  02/19/2017 (Originally 03/22/1934)  . DEXA SCAN  02/19/2017 (Originally 03/23/1991)  . FOOT EXAM  03/18/2017  . HEMOGLOBIN A1C  05/30/2017  . URINE MICROALBUMIN  12/21/2017  .  INFLUENZA VACCINE  Completed  . PNA vac Low Risk Adult  Completed   Fall Risk  12/23/2016 12/20/2016  Falls in the past year? Yes Yes  Number falls in past yr: 2 or more 2 or more  Injury with Fall? Yes Yes  Risk Factor Category  High Fall Risk -   Functional Status Survey:    Vitals:   12/30/16 1421  BP: 126/78  Pulse: 72  Resp: 18  Temp: 98.7 F (37.1 C)  TempSrc: Oral  SpO2: 96%  Weight is stable at 118.2  Physical Exam In general this is a fairly well-nourished elderly female in no distress sitting comfortably in her wheelchair.  Her skin is warm and dry.  Eyes she does have prescription lenses visible acuity appears to be grossly intact.  Oropharynx is clear mucous membranes moist.  Chest is clear to auscultation there is no labored breathing.  Heart is regular rate and rhythm with distant heart sounds she has minimal lower extremity edema bilaterally.  Her abdomen is soft nontender with positive bowel sounds.  Musculoskeletal has general frailty but is able to move all extremities 4.  Neurologic is grossly intact no lateralizing findings her speech is clear although sometimes nonsensical.  Psych she  is oriented to self--at times she can understand and  respond appropriately Labs reviewed:  Recent Labs  01/09/16 0500 08/19/16 0700 11/30/16 0702  NA 138 139 139  K 3.7 3.9 4.0  CL 105 102 105  CO2 26 27 27   GLUCOSE 112* 99 93  BUN 27* 23* 23*  CREATININE 0.78 0.77 0.84  CALCIUM 8.6* 9.0 8.8*    Recent Labs  01/09/16 0500 11/30/16 0702  AST 27 21  ALT 25 21  ALKPHOS 160* 115  BILITOT 1.1 0.7  PROT 5.7* 5.7*  ALBUMIN 2.9* 3.2*    Recent Labs  01/09/16 0500 08/19/16 0700 11/30/16 0702  WBC 11.1* 7.6 5.6  NEUTROABS  --  4.8  --   HGB 11.5* 13.5 13.1  HCT 36.5 43.0 40.8  MCV 83.0 82.9 84.5  PLT 300 138* 125*   Lab Results  Component Value Date   TSH <0.010 (L) 11/30/2016   Lab Results  Component Value Date   HGBA1C 6.0 (H) 11/30/2016   Lab Results  Component Value Date   CHOL 118 09/29/2016   HDL 31 (L) 09/29/2016   LDLCALC 73 09/29/2016   TRIG 68 09/29/2016   CHOLHDL 3.8 09/29/2016    Significant Diagnostic Results in last 30 days:  No results found.  Assessment/Plan  #1-history of dementia with behaviors again she is followed by psych services she continues on numerous medications including Remeron daily at bedtime-Xanax when necessary 3 times a day as well as routinely at night-she is also on Zoloft-she is on Zyprexa at night as well.  I do not note any excessive sedation but this will have to be watched she is followed by psychiatric services.  #2 history of-relation this appears rate controlled on metoprolol she is on  Eliquis for anticoagulation.  #3 history CVA again continues on Eliquis -.  #4-history hyperthyroidism a relatively new diagnosis she is followed by endocrinology and has been started on Tapazole updated labs are being followed by endocrinology.  #5-history of recurrent UTI she is on trimethoprim prophylactically-she has had increased confusion most recent culture has grown out greater than 100,000 colonies we are awaiting  sensitivities.  #6-hypertension she continues on Cozaar as well as metoprolol this appears to be stable.  #7-recent  history of cough she did have a chest x-ray which did not show any acute process at this point will monitor.--She does have a when necessary duo neb order. She also has when necessary Robitussin  #8 history neuropathy she continues on Neurontin low-dose 100 mg twice a day.  [id number]

## 2016-12-31 LAB — URINE CULTURE

## 2017-01-03 ENCOUNTER — Encounter: Payer: Self-pay | Admitting: Internal Medicine

## 2017-01-03 ENCOUNTER — Non-Acute Institutional Stay (SKILLED_NURSING_FACILITY): Payer: Medicare Other | Admitting: Internal Medicine

## 2017-01-03 DIAGNOSIS — F0391 Unspecified dementia with behavioral disturbance: Secondary | ICD-10-CM | POA: Diagnosis not present

## 2017-01-03 DIAGNOSIS — N3000 Acute cystitis without hematuria: Secondary | ICD-10-CM

## 2017-01-03 NOTE — Progress Notes (Signed)
Location:   [place] Room Number: 102/P Place of Service:  SNF (31) Provider:  Kyung Rudd, Rene Kocher, MD  Patient Care Team: Virgie Dad, MD as PCP - General (Internal Medicine) Neldon Mc, MD (General Surgery) Jonnie Kind, MD as Consulting Physician (Obstetrics and Gynecology) Wille Celeste, PA-C as Physician Assistant (Internal Medicine)  Extended Emergency Contact Information Primary Emergency Contact: Alferd Patee, [place] [address] Johnnette Litter of [place] Phone: [phone] Mobile Phone: [phone] Relation: Relative Secondary Emergency Contact: [place] Crane Address: [address]          [place], [place] [address] Johnnette Litter of [place] Phone: [phone] Relation: Son  Code Status:  DNR Goals of care: Advanced Directive information Advanced Directives 01/03/2017  Does Patient Have a Medical Advance Directive? Yes  Type of Advance Directive Out of facility DNR (pink MOST or yellow form)  Does patient want to make changes to medical advance directive? No - Patient declined  Copy of [place] in Chart? -  Would patient like information on creating a medical advance directive? -  Pre-existing out of facility DNR order (yellow form or pink MOST form) -     Chief Complaint  Patient presents with  . Acute Visit    F/U UTI    HPI:  Pt is a 83 y.o. female seen today for an acute visit for Recently Diagnosed with UTI over the weekend. Patient is new transfer to our practice. She is long term resident of facility. She has h/o CVA in 2016 , Chronic Atrial fibrillation, Hypertension, recurrent UTI , recurrent Falls , S/p Hip Fracture , GI bleed, CKD,  and Dementia with behavior Issues.And recent diagnosis with Hyperthyroidism On treatment.  Patient was seen for routine visit and had some increased confusion and had UA and Culture  done which showed more then 100,000 Colonies of E Coli  And  she was started on Ceftriaxone IM. Nurses wanted patient revaluated to see if she can be changed to PO med. Patient is unable to give any history due to her Dementia She continues to have bouts of Confusion and Anxiety. Usually respond to Xanax.' Nofever or chills.   Past Medical History:  Diagnosis Date  . Arthritis   . Cancer ([place])    Rt Breast  . Coronary artery disease   . Cyst of breast    Right - at 43 yrs of age.  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 Diabetes mellitus without complication ([place])   . Diverticulitis   . Elevated cholesterol   . GERD (gastroesophageal reflux disease)   . Hardening of the arteries of the brain   . Heart disease   . Herniated disc   . Hx Breast cancer, DCIS, Right 08/19/2010  . Hypertension   . Migraine headache   . SBO (small bowel obstruction) ([place]) 09/02/11  . Shingles   . Stroke [facility]) 2004   cerebral hemmorhage   Past Surgical History:  Procedure Laterality Date  . BRAIN SURGERY     hemorrhage  . BREAST SURGERY  09/25/2010   mastectomy  . CYSTECTOMY    . ESOPHAGOGASTRODUODENOSCOPY N/A 11/13/2015   Procedure: ESOPHAGOGASTRODUODENOSCOPY (EGD);  Surgeon: Rogene Houston, MD;  [facility];  Service: Endoscopy;  Laterality: N/A;  . fibroid tumors    . KNEE ARTHROSCOPY Right    right  knee x 3  . LAPAROSCOPIC BILATERAL SALPINGO OOPHERECTOMY    . SPINE SURGERY     repair herniated disc  . TOTAL HIP ARTHROPLASTY Right 11/26/2015   Procedure: HEMI HIP ARTHROPLASTY ANTERIOR APPROACH;  Surgeon: Rod Can, MD;  Location: [facility];  Service: Orthopedics;  Laterality: Right;    Allergies  Allergen Reactions  . Tape Rash  . Ciprofloxacin   . Sulfa Antibiotics Hives  . Codeine Nausea Only    nausea.  . Morphine And Related Nausea Only    Nausea only.    Outpatient Encounter Prescriptions as of 01/03/2017  Medication Sig  . acetaminophen (TYLENOL) 325 MG tablet Take 650 mg by mouth  every 6 (six) hours as needed.  . ALPRAZolam (XANAX) 0.25 MG tablet Take 0.25 mg by mouth at bedtime as needed for anxiety. Give 1 tablet by mouth three times a day  . apixaban (ELIQUIS) 2.5 MG TABS tablet Take 1 tablet (2.5 mg total) by mouth 2 (two) times daily.  Roseanne Kaufman Peru-Castor Oil (VENELEX) OINT Apply to sacrum and bilateral buttocks q shift and prn  . cefTRIAXone (ROCEPHIN) 1 g injection Inject 1 g into the muscle daily.  Marland Kitchen Dextromethorphan-Guaifenesin 10-100 MG/5ML liquid Take 15 mLs by mouth every 6 (six) hours as needed.  . gabapentin (NEURONTIN) 100 MG capsule Take 1 capsule (100 mg total) by mouth 2 (two) times daily.  Marland Kitchen ipratropium-albuterol (DUONEB) 0.5-2.5 (3) MG/3ML SOLN Take 3 mLs by nebulization 4 (four) times daily as needed.  Marland Kitchen losartan (COZAAR) 50 MG tablet Take 1 tablet (50 mg total) by mouth daily.  . methimazole (TAPAZOLE) 5 MG tablet Take 1 tablet (5 mg total) by mouth daily.  . metoprolol 75 MG TABS Take 75 mg by mouth 2 (two) times daily.  . mirtazapine (REMERON) 7.5 MG tablet Take 7.5 mg by mouth at bedtime.  Marland Kitchen OLANZapine (ZYPREXA) 5 MG tablet Take 5 mg by mouth at bedtime.  . pantoprazole (PROTONIX) 40 MG tablet Take 40 mg by mouth daily.  . Probiotic Product (RISA-BID PROBIOTIC) TABS Take 1 tablet by mouth twice a day from 12/31/2016-01/10/2017  . senna (SENOKOT) 8.6 MG TABS tablet Take 1 tablet by mouth at bedtime.  . sertraline (ZOLOFT) 50 MG tablet Take 50 mg by mouth daily.  Marland Kitchen trimethoprim (TRIMPEX) 100 MG tablet Take 100 mg by mouth at bedtime.   No facility-administered encounter medications on file as of 01/03/2017.      Review of Systems  Unable to perform ROS: Dementia    Immunization History  Administered Date(s) Administered  . Influenza-Unspecified 12/24/2016  . Pneumococcal Conjugate-13 12/24/2016  . Pneumococcal Polysaccharide-23 12/30/2015   Pertinent  Health Maintenance Due  Topic Date Due  . OPHTHALMOLOGY EXAM  02/19/2017  (Originally 03/22/1934)  . DEXA SCAN  02/19/2017 (Originally 03/23/1991)  . FOOT EXAM  03/18/2017  . HEMOGLOBIN A1C  05/30/2017  . URINE MICROALBUMIN  12/21/2017  . INFLUENZA VACCINE  Completed  . PNA vac Low Risk Adult  Completed   Fall Risk  12/23/2016 12/20/2016  Falls in the past year? Yes Yes  Number falls in past yr: 2 or more 2 or more  Injury with Fall? Yes Yes  Risk Factor Category  High Fall Risk -   Functional Status Survey:    Vitals:   01/03/17 0926  BP: (!) 132/54  Pulse: 69  Resp: 20  Temp: 97.7 F (36.5 C)  TempSrc: Oral  SpO2: 98%   There is no height or weight on file  to calculate BMI. Physical Exam  Constitutional: She appears well-developed.  HENT:  Head: Normocephalic.  Mouth/Throat: Oropharynx is clear and moist.  Eyes: Pupils are equal, round, and reactive to light.  Neck: Neck supple.  Cardiovascular: Normal rate and normal heart sounds.   Pulmonary/Chest: Effort normal and breath sounds normal.  Abdominal: Soft. Bowel sounds are normal. She exhibits no distension. There is no tenderness. There is no rebound.  Musculoskeletal: She exhibits no edema.  Neurological: She is alert.  Skin: Skin is warm and dry.    Labs reviewed:  Recent Labs  01/09/16 0500 08/19/16 0700 11/30/16 0702  NA 138 139 139  K 3.7 3.9 4.0  CL 105 102 105  CO2 26 27 27   GLUCOSE 112* 99 93  BUN 27* 23* 23*  CREATININE 0.78 0.77 0.84  CALCIUM 8.6* 9.0 8.8*    Recent Labs  01/09/16 0500 11/30/16 0702  AST 27 21  ALT 25 21  ALKPHOS 160* 115  BILITOT 1.1 0.7  PROT 5.7* 5.7*  ALBUMIN 2.9* 3.2*    Recent Labs  01/09/16 0500 08/19/16 0700 11/30/16 0702  WBC 11.1* 7.6 5.6  NEUTROABS  --  4.8  --   HGB 11.5* 13.5 13.1  HCT 36.5 43.0 40.8  MCV 83.0 82.9 84.5  PLT 300 138* 125*   Lab Results  Component Value Date   TSH <0.010 (L) 11/30/2016   Lab Results  Component Value Date   HGBA1C 6.0 (H) 11/30/2016   Lab Results  Component Value Date   CHOL  118 09/29/2016   HDL 31 (L) 09/29/2016   LDLCALC 73 09/29/2016   TRIG 68 09/29/2016   CHOLHDL 3.8 09/29/2016    Significant Diagnostic Results in last 30 days:  No results found.  Assessment/Plan  UTI resistant to First generation Cephalosporins Will start her On Cefdinir. Patient allergic to Cipro  Dementia with behavioral disturbance D/W Sitters in the room Patient does get very agitated and howlers during day time. She is on Xanax Prn. ? Will  consider to  Start her on Zyprexa in the morning. Will D/w her family.  Hyperthyroidism On Tapazole will follows with Dr Dorris Fetch   Family/ staff Communication:   Labs/tests ordered:

## 2017-01-04 ENCOUNTER — Non-Acute Institutional Stay (SKILLED_NURSING_FACILITY): Payer: Medicare Other | Admitting: Internal Medicine

## 2017-01-04 ENCOUNTER — Encounter: Payer: Self-pay | Admitting: Internal Medicine

## 2017-01-04 DIAGNOSIS — F0391 Unspecified dementia with behavioral disturbance: Secondary | ICD-10-CM | POA: Diagnosis not present

## 2017-01-04 DIAGNOSIS — F0392 Unspecified dementia, unspecified severity, with psychotic disturbance: Secondary | ICD-10-CM

## 2017-01-04 DIAGNOSIS — F339 Major depressive disorder, recurrent, unspecified: Secondary | ICD-10-CM | POA: Insufficient documentation

## 2017-01-04 NOTE — Progress Notes (Signed)
Location:   [facility] Room Number: 102/P Place of Service:  SNF (31) Provider:  Kyung Rudd, Rene Kocher, MD  Patient Care Team: Virgie Dad, MD as PCP - General (Internal Medicine) Neldon Mc, MD (General Surgery) Jonnie Kind, MD as Consulting Physician (Obstetrics and Gynecology) Wille Celeste, PA-C as Physician Assistant (Internal Medicine)  Extended Emergency Contact Information Primary Emergency Contact: Alferd Patee, [place] [address] Johnnette Litter of [place] Phone: [phone] Mobile Phone: [phone] Relation: Relative Secondary Emergency Contact: Nevada Crane Address: [address]          [place], [place] [address] Johnnette Litter of [place] Phone: [phone] Relation: Son  Code Status:  DNR Goals of care: Advanced Directive information Advanced Directives 01/04/2017  Does Patient Have a Medical Advance Directive? Yes  Type of Advance Directive Out of facility DNR (pink MOST or yellow form)  Does patient want to make changes to medical advance directive? No - Patient declined  Copy of [place] in Chart? -  Would patient like information on creating a medical advance directive? -  Pre-existing out of facility DNR order (yellow form or pink MOST form) -     Chief Complaint  Patient presents with  . Acute Visit    Medication Adjustment    HPI:  Pt is a 83 y.o. female seen today for an acute visit for Behavior Issues with her dementia.  She has h/o CVA in 2016 , Chronic Atrial fibrillation, Hypertension, recurrent UTI , recurrent Falls , S/p Hip Fracture , GI bleed, CKD, and Dementia with behavior Issues.And recent diagnosis with Hyperthyroidism On treatment.  Patient has been having Behavior issues in the facility. per her Sitter and Her Nurses she gets agitated after usually 2 pm and sometimes stays up all night Screaming . She has been on Zyprexa since June of this year. Her  Risperdal was changed to Zyprexa. She is also on Xanax PRN and Remeron at night.Also On Zoloft.  Patient is unable to give me any history due to her dementia. But reviewing her records she has been on Seroquel 50 mg before with Risperdal. AND she has diagnosis of Dementia with Psychosis and Anxiety.  She is on Antibiotics for UTI.   Past Medical History:  Diagnosis Date  . Arthritis   . Cancer ([place])    Rt Breast  . Coronary artery disease   . Cyst of breast    Right - at 16 yrs of age.  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 Diabetes mellitus without complication ([place])   . Diverticulitis   . Elevated cholesterol   . GERD (gastroesophageal reflux disease)   . Hardening of the arteries of the brain   . Heart disease   . Herniated disc   . Hx Breast cancer, DCIS, Right 08/19/2010  . Hypertension   . Migraine headache   . SBO (small bowel obstruction) ([place]) 09/02/11  . Shingles   . Stroke [facility]) 2004   cerebral hemmorhage   Past Surgical History:  Procedure Laterality Date  . BRAIN SURGERY     hemorrhage  . BREAST SURGERY  09/25/2010   mastectomy  . CYSTECTOMY    . ESOPHAGOGASTRODUODENOSCOPY N/A 11/13/2015   Procedure: ESOPHAGOGASTRODUODENOSCOPY (EGD);  Surgeon: Rogene Houston, MD;  [facility];  Service: Endoscopy;  Laterality: N/A;  . fibroid tumors    .  KNEE ARTHROSCOPY Right    right knee x 3  . LAPAROSCOPIC BILATERAL SALPINGO OOPHERECTOMY    . SPINE SURGERY     repair herniated disc  . TOTAL HIP ARTHROPLASTY Right 11/26/2015   Procedure: HEMI HIP ARTHROPLASTY ANTERIOR APPROACH;  Surgeon: Rod Can, MD;  Location: [facility]S;  Service: Orthopedics;  Laterality: Right;    Allergies  Allergen Reactions  . Tape Rash  . Ciprofloxacin   . Sulfa Antibiotics Hives  . Codeine Nausea Only    nausea.  . Morphine And Related Nausea Only    Nausea only.    Outpatient Encounter Prescriptions as of 01/04/2017  Medication  Sig  . acetaminophen (TYLENOL) 325 MG tablet Take 650 mg by mouth every 6 (six) hours as needed.  . ALPRAZolam (XANAX) 0.25 MG tablet Take 0.25 mg by mouth at bedtime as needed for anxiety. Give 1 tablet by mouth three times a day  . apixaban (ELIQUIS) 2.5 MG TABS tablet Take 1 tablet (2.5 mg total) by mouth 2 (two) times daily.  Roseanne Kaufman Peru-Castor Oil (VENELEX) OINT Apply to sacrum and bilateral buttocks q shift and prn  . cefdinir (OMNICEF) 300 MG capsule Take 300 mg by mouth 2 (two) times daily.  Marland Kitchen Dextromethorphan-Guaifenesin 10-100 MG/5ML liquid Take 15 mLs by mouth every 6 (six) hours as needed.  . gabapentin (NEURONTIN) 100 MG capsule Take 1 capsule (100 mg total) by mouth 2 (two) times daily.  Marland Kitchen ipratropium-albuterol (DUONEB) 0.5-2.5 (3) MG/3ML SOLN Take 3 mLs by nebulization 4 (four) times daily as needed.  Marland Kitchen losartan (COZAAR) 50 MG tablet Take 1 tablet (50 mg total) by mouth daily.  . methimazole (TAPAZOLE) 5 MG tablet Take 1 tablet (5 mg total) by mouth daily.  . metoprolol 75 MG TABS Take 75 mg by mouth 2 (two) times daily.  . mirtazapine (REMERON) 7.5 MG tablet Take 7.5 mg by mouth at bedtime.  Marland Kitchen OLANZapine (ZYPREXA) 5 MG tablet Take 5 mg by mouth at bedtime.  . pantoprazole (PROTONIX) 40 MG tablet Take 40 mg by mouth daily.  . Probiotic Product (RISA-BID PROBIOTIC) TABS Take 1 tablet by mouth twice a day from 12/31/2016-01/10/2017  . senna (SENOKOT) 8.6 MG TABS tablet Take 1 tablet by mouth at bedtime.  . sertraline (ZOLOFT) 50 MG tablet Take 50 mg by mouth daily.  Marland Kitchen trimethoprim (TRIMPEX) 100 MG tablet Take 100 mg by mouth at bedtime.  . [DISCONTINUED] cefTRIAXone (ROCEPHIN) 1 g injection Inject 1 g into the muscle daily.   No facility-administered encounter medications on file as of 01/04/2017.      Review of Systems  Unable to perform ROS: Dementia    Immunization History  Administered Date(s) Administered  . Influenza-Unspecified 12/24/2016  . Pneumococcal  Conjugate-13 12/24/2016  . Pneumococcal Polysaccharide-23 12/30/2015   Pertinent  Health Maintenance Due  Topic Date Due  . OPHTHALMOLOGY EXAM  02/19/2017 (Originally 03/22/1934)  . DEXA SCAN  02/19/2017 (Originally 03/23/1991)  . FOOT EXAM  03/18/2017  . HEMOGLOBIN A1C  05/30/2017  . URINE MICROALBUMIN  12/21/2017  . INFLUENZA VACCINE  Completed  . PNA vac Low Risk Adult  Completed   Fall Risk  12/23/2016 12/20/2016  Falls in the past year? Yes Yes  Number falls in past yr: 2 or more 2 or more  Injury with Fall? Yes Yes  Risk Factor Category  High Fall Risk -   Functional Status Survey:    Vitals:   01/04/17 1128  BP: (!) 122/56  Pulse: (!) 56  Resp: 16  Temp: 98.4 F (36.9 C)  TempSrc: Oral  SpO2: 99%   There is no height or weight on file to calculate BMI. Physical Exam  Constitutional: She appears well-developed and well-nourished.  HENT:  Head: Normocephalic.  Mouth/Throat: Oropharynx is clear and moist.  Eyes: Pupils are equal, round, and reactive to light.  Neck: Neck supple.  Cardiovascular: Normal rate and normal heart sounds.   Pulmonary/Chest: Effort normal and breath sounds normal. No respiratory distress. She has no wheezes.  Has some rales Bilateral  Abdominal: Soft. Bowel sounds are normal. She exhibits no distension. There is no tenderness. There is no rebound.  Musculoskeletal: She exhibits no edema.  Neurological: She is alert.  Does not Follows any Commands  Skin: Skin is warm and dry.  Psychiatric:  Sitting in the Wheelchair . Speech stays incoherent    Labs reviewed:  Recent Labs  01/09/16 0500 08/19/16 0700 11/30/16 0702  NA 138 139 139  K 3.7 3.9 4.0  CL 105 102 105  CO2 26 27 27   GLUCOSE 112* 99 93  BUN 27* 23* 23*  CREATININE 0.78 0.77 0.84  CALCIUM 8.6* 9.0 8.8*    Recent Labs  01/09/16 0500 11/30/16 0702  AST 27 21  ALT 25 21  ALKPHOS 160* 115  BILITOT 1.1 0.7  PROT 5.7* 5.7*  ALBUMIN 2.9* 3.2*    Recent Labs   01/09/16 0500 08/19/16 0700 11/30/16 0702  WBC 11.1* 7.6 5.6  NEUTROABS  --  4.8  --   HGB 11.5* 13.5 13.1  HCT 36.5 43.0 40.8  MCV 83.0 82.9 84.5  PLT 300 138* 125*   Lab Results  Component Value Date   TSH <0.010 (L) 11/30/2016   Lab Results  Component Value Date   HGBA1C 6.0 (H) 11/30/2016   Lab Results  Component Value Date   CHOL 118 09/29/2016   HDL 31 (L) 09/29/2016   LDLCALC 73 09/29/2016   TRIG 68 09/29/2016   CHOLHDL 3.8 09/29/2016    Significant Diagnostic Results in last 30 days:  No results found.  Assessment/Plan  Dementia with Psychosis I have d/w Nurses and Sitter. It looks like Mrs Rogness gets Agitated mostly after 2 pm. And then stays up all night. Will slowly take her off Zyprexa and start her on Seroquel BID. At 1 pm and 9 pm Also eventually start her on Ativan at night which is long acting then Xanax. Increase her Zoloft to 75 mg Also will consider starting her on Namenda. Will make changes slowly. And follow her along.   Family/ staff Communication:   Labs/tests ordered:   Total time spent in this patient care encounter was _35 minutes; greater than 50% of the visit spent counseling patient, reviewing records , Labs and coordinating care for problems addressed at this encounter.

## 2017-01-06 ENCOUNTER — Non-Acute Institutional Stay (SKILLED_NURSING_FACILITY): Payer: Medicare Other | Admitting: Internal Medicine

## 2017-01-06 ENCOUNTER — Encounter: Payer: Self-pay | Admitting: Internal Medicine

## 2017-01-06 DIAGNOSIS — F339 Major depressive disorder, recurrent, unspecified: Secondary | ICD-10-CM

## 2017-01-06 DIAGNOSIS — F0391 Unspecified dementia with behavioral disturbance: Secondary | ICD-10-CM | POA: Diagnosis not present

## 2017-01-06 DIAGNOSIS — F0392 Unspecified dementia, unspecified severity, with psychotic disturbance: Secondary | ICD-10-CM

## 2017-01-06 NOTE — Progress Notes (Signed)
Location:   [facility] Room Number: 102/P Place of Service:  SNF (31) Provider:  Kyung Rudd, Rene Kocher, MD  Patient Care Team: Virgie Dad, MD as PCP - General (Internal Medicine) Neldon Mc, MD (General Surgery) Jonnie Kind, MD as Consulting Physician (Obstetrics and Gynecology) Wille Celeste, PA-C as Physician Assistant (Internal Medicine)  Extended Emergency Contact Information Primary Emergency Contact: Alferd Patee, [place] [address] Johnnette Litter of [place] Phone: [phone] Mobile Phone: [phone] Relation: Relative Secondary Emergency Contact: Nevada Crane Address: [address]          [place], [place] [address] Johnnette Litter of [place] Phone: [phone] Relation: Son  Code Status:  DNR Goals of care: Advanced Directive information Advanced Directives 01/06/2017  Does Patient Have a Medical Advance Directive? Yes  Type of Advance Directive Out of facility DNR (pink MOST or yellow form)  Does patient want to make changes to medical advance directive? No - Patient declined  Copy of [place] in Chart? -  Would patient like information on creating a medical advance directive? -  Pre-existing out of facility DNR order (yellow form or pink MOST form) -     Chief Complaint  Patient presents with  . Acute Visit    Behavior problems due to Dementia    HPI:  Pt is a 83 y.o. female seen today for an acute visit for Follow up On her Dementia with Behavior Problems. She has h/o CVA in 2016 , Chronic Atrial fibrillation, Hypertension, recurrent UTI , recurrent Falls , S/p Hip Fracture , GI bleed, CKD, and Dementia with behavior Issues.And recent diagnosis with Hyperthyroidism On treatment  Patient has been having Behavior issues in the facility. per her Sitter and Her Nurses she gets agitated after usually 2 pm and sometimes stays up all night Screaming . She has been on Zyprexa since June  of this year. Her Risperdal was changed to Zyprexa. She is also on Xanax PRN and Remeron at night.Also On Zoloft.  Patient is unable to give me any history due to her dementia. But reviewing her records she has been on Seroquel 50 mg before with Risperdal. AND she has diagnosis of Dementia with Psychosis and Anxiety.  She is on Antibiotics for UTI.  This was follow up visit as I had started her on low dose of Seroquel BID and trying to taper her off Zyprexa. Patient has tolerated these meds changes. Per sitter she  Still did not sleep well at night. But her screaming seems less.this morning but she was screaming again in afternoon.She does not seem Sedated. She was more Coherent this morning.  Past Medical History:  Diagnosis Date  . Arthritis   . Cancer ([place])    Rt Breast  . Coronary artery disease   . Cyst of breast    Right - at 36 yrs of age.  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 Diabetes mellitus without complication ([place])   . Diverticulitis   . Elevated cholesterol   . GERD (gastroesophageal reflux disease)   . Hardening of the arteries of the brain   . Heart disease   . Herniated disc   . Hx Breast cancer, DCIS, Right 08/19/2010  . Hypertension   . Migraine headache   . SBO (small bowel obstruction) ([place]) 09/02/11  . Shingles   . Stroke [facility]) 2004  cerebral hemmorhage   Past Surgical History:  Procedure Laterality Date  . BRAIN SURGERY     hemorrhage  . BREAST SURGERY  09/25/2010   mastectomy  . CYSTECTOMY    . ESOPHAGOGASTRODUODENOSCOPY N/A 11/13/2015   Procedure: ESOPHAGOGASTRODUODENOSCOPY (EGD);  Surgeon: Rogene Houston, MD;  [facility];  Service: Endoscopy;  Laterality: N/A;  . fibroid tumors    . KNEE ARTHROSCOPY Right    right knee x 3  . LAPAROSCOPIC BILATERAL SALPINGO OOPHERECTOMY    . SPINE SURGERY     repair herniated disc  . TOTAL HIP ARTHROPLASTY Right 11/26/2015   Procedure: HEMI HIP ARTHROPLASTY  ANTERIOR APPROACH;  Surgeon: Rod Can, MD;  [facility];  Service: Orthopedics;  Laterality: Right;    Allergies  Allergen Reactions  . Tape Rash  . Ciprofloxacin   . Sulfa Antibiotics Hives  . Codeine Nausea Only    nausea.  . Morphine And Related Nausea Only    Nausea only.    Outpatient Encounter Prescriptions as of 01/06/2017  Medication Sig  . acetaminophen (TYLENOL) 325 MG tablet Take 650 mg by mouth every 6 (six) hours as needed.  . ALPRAZolam (XANAX) 0.25 MG tablet Take 0.25 mg by mouth at bedtime as needed for anxiety. Give 1 tablet by mouth three times a day  . apixaban (ELIQUIS) 2.5 MG TABS tablet Take 1 tablet (2.5 mg total) by mouth 2 (two) times daily.  Roseanne Kaufman Peru-Castor Oil (VENELEX) OINT Apply to sacrum and bilateral buttocks q shift and prn  . cefdinir (OMNICEF) 300 MG capsule Take 300 mg by mouth 2 (two) times daily.  Marland Kitchen Dextromethorphan-Guaifenesin 10-100 MG/5ML liquid Take 15 mLs by mouth every 6 (six) hours as needed.  . gabapentin (NEURONTIN) 100 MG capsule Take 1 capsule (100 mg total) by mouth 2 (two) times daily.  Marland Kitchen ipratropium-albuterol (DUONEB) 0.5-2.5 (3) MG/3ML SOLN Take 3 mLs by nebulization 4 (four) times daily as needed.  Marland Kitchen losartan (COZAAR) 50 MG tablet Take 1 tablet (50 mg total) by mouth daily.  . methimazole (TAPAZOLE) 5 MG tablet Take 1 tablet (5 mg total) by mouth daily.  . metoprolol 75 MG TABS Take 75 mg by mouth 2 (two) times daily.  . mirtazapine (REMERON) 7.5 MG tablet Take 7.5 mg by mouth at bedtime.  Marland Kitchen OLANZapine (ZYPREXA) 2.5 MG tablet Take 2.5 mg by mouth at bedtime.  . pantoprazole (PROTONIX) 40 MG tablet Take 40 mg by mouth daily.  . Probiotic Product (RISA-BID PROBIOTIC) TABS Take 1 tablet by mouth twice a day from 12/31/2016-01/10/2017  . QUEtiapine (SEROQUEL) 25 MG tablet Take 12.5 mg by mouth 2 (two) times daily.  Marland Kitchen senna (SENOKOT) 8.6 MG TABS tablet Take 1 tablet by mouth at bedtime.  . sertraline (ZOLOFT) 50 MG  tablet Take 50 mg by mouth daily.  Marland Kitchen trimethoprim (TRIMPEX) 100 MG tablet Take 100 mg by mouth at bedtime.  . [DISCONTINUED] OLANZapine (ZYPREXA) 5 MG tablet Take 5 mg by mouth at bedtime.   No facility-administered encounter medications on file as of 01/06/2017.      Review of Systems  Unable to perform ROS: Dementia    Immunization History  Administered Date(s) Administered  . Influenza-Unspecified 12/24/2016  . Pneumococcal Conjugate-13 12/24/2016  . Pneumococcal Polysaccharide-23 12/30/2015   Pertinent  Health Maintenance Due  Topic Date Due  . OPHTHALMOLOGY EXAM  02/19/2017 (Originally 03/22/1934)  . DEXA SCAN  02/19/2017 (Originally 03/23/1991)  . FOOT EXAM  03/18/2017  . HEMOGLOBIN A1C  05/30/2017  . URINE MICROALBUMIN  12/21/2017  . INFLUENZA VACCINE  Completed  . PNA vac Low Risk Adult  Completed   Fall Risk  12/23/2016 12/20/2016  Falls in the past year? Yes Yes  Number falls in past yr: 2 or more 2 or more  Injury with Fall? Yes Yes  Risk Factor Category  High Fall Risk -   Functional Status Survey:    Vitals:   01/06/17 1154  BP: 133/76  Pulse: 80  Resp: 18  Temp: 98.5 F (36.9 C)  TempSrc: Oral  SpO2: 98%   There is no height or weight on file to calculate BMI. Physical Exam  Constitutional: She appears well-developed and well-nourished.  HENT:  Head: Normocephalic.  Mouth/Throat: Oropharynx is clear and moist.  Eyes: Pupils are equal, round, and reactive to light.  Neck: Neck supple.  Cardiovascular: Normal rate and normal heart sounds.   Pulmonary/Chest: Effort normal and breath sounds normal. No respiratory distress. She has no wheezes. She has no rales.  Abdominal: Soft. Bowel sounds are normal. She exhibits no distension. There is no tenderness. There is no rebound.  Musculoskeletal: She exhibits no edema.  Neurological: She is alert.  Was talking coherent this morning but Screming agin after 2pm    Labs reviewed:  Recent Labs   01/09/16 0500 08/19/16 0700 11/30/16 0702  NA 138 139 139  K 3.7 3.9 4.0  CL 105 102 105  CO2 26 27 27   GLUCOSE 112* 99 93  BUN 27* 23* 23*  CREATININE 0.78 0.77 0.84  CALCIUM 8.6* 9.0 8.8*    Recent Labs  01/09/16 0500 11/30/16 0702  AST 27 21  ALT 25 21  ALKPHOS 160* 115  BILITOT 1.1 0.7  PROT 5.7* 5.7*  ALBUMIN 2.9* 3.2*    Recent Labs  01/09/16 0500 08/19/16 0700 11/30/16 0702  WBC 11.1* 7.6 5.6  NEUTROABS  --  4.8  --   HGB 11.5* 13.5 13.1  HCT 36.5 43.0 40.8  MCV 83.0 82.9 84.5  PLT 300 138* 125*   Lab Results  Component Value Date   TSH <0.010 (L) 11/30/2016   Lab Results  Component Value Date   HGBA1C 6.0 (H) 11/30/2016   Lab Results  Component Value Date   CHOL 118 09/29/2016   HDL 31 (L) 09/29/2016   LDLCALC 73 09/29/2016   TRIG 68 09/29/2016   CHOLHDL 3.8 09/29/2016    Significant Diagnostic Results in last 30 days:  No results found.  Assessment/Plan Dementia With Psychosis  She continues to Scream and not sleep at night. Will increase her Seroquel to 25 mg BID. Hold for Sedation. Also discontinue Xanax and start her on Ativan 0.5 mg Q 8 Hours prn for Anxiety. Ativan is longer acting then Xanax.Incrase her Zoloft to 75 mg. Also will consider starting her on Namenda. Will make changes slowly. And follow her along   Family/ staff Communication:   Labs/tests ordered:   Total time spent in this patient care encounter was 25_ minutes; greater than 50% of the visit spent counseling patient, reviewing records , Labs and coordinating care for problems addressed at this encounter.

## 2017-01-19 DIAGNOSIS — B351 Tinea unguium: Secondary | ICD-10-CM | POA: Diagnosis not present

## 2017-01-19 DIAGNOSIS — E1151 Type 2 diabetes mellitus with diabetic peripheral angiopathy without gangrene: Secondary | ICD-10-CM | POA: Diagnosis not present

## 2017-01-19 DIAGNOSIS — Z7984 Long term (current) use of oral hypoglycemic drugs: Secondary | ICD-10-CM | POA: Diagnosis not present

## 2017-01-20 ENCOUNTER — Non-Acute Institutional Stay (SKILLED_NURSING_FACILITY): Payer: Medicare Other | Admitting: Internal Medicine

## 2017-01-20 ENCOUNTER — Encounter: Payer: Self-pay | Admitting: Internal Medicine

## 2017-01-20 DIAGNOSIS — F0391 Unspecified dementia with behavioral disturbance: Secondary | ICD-10-CM

## 2017-01-20 DIAGNOSIS — F339 Major depressive disorder, recurrent, unspecified: Secondary | ICD-10-CM

## 2017-01-20 DIAGNOSIS — F0392 Unspecified dementia, unspecified severity, with psychotic disturbance: Secondary | ICD-10-CM

## 2017-01-20 NOTE — Progress Notes (Signed)
Location:   [facility] Room Number: 102/P Place of Service:  SNF (31) Provider:  Kyung Rudd, Rene Kocher, MD  Patient Care Team: Virgie Dad, MD as PCP - General (Internal Medicine) Neldon Mc, MD (General Surgery) Jonnie Kind, MD as Consulting Physician (Obstetrics and Gynecology) Wille Celeste, PA-C as Physician Assistant (Internal Medicine)  Extended Emergency Contact Information Primary Emergency Contact: Alferd Patee, [place] [address] Johnnette Litter of [place] Phone: [phone] Mobile Phone: [phone] Relation: Relative Secondary Emergency Contact: Nevada Crane Address: [address]          [place], [place] [address] Johnnette Litter of [place] Phone: [phone] Relation: Son  Code Status:  DNR Goals of care: Advanced Directive information Advanced Directives 01/20/2017  Does Patient Have a Medical Advance Directive? Yes  Type of Advance Directive Out of facility DNR (pink MOST or yellow form)  Does patient want to make changes to medical advance directive? No - Patient declined  Copy of [place] in Chart? -  Would patient like information on creating a medical advance directive? -  Pre-existing out of facility DNR order (yellow form or pink MOST form) -     Chief Complaint  Patient presents with  . Acute Visit    Behavior problems    HPI:  Pt is a 83 y.o. female seen today for an acute visit for Behavior Issues with her Dementia  She has h/o CVA in 2016 , Chronic Atrial fibrillation, Hypertension, recurrent UTI , recurrent Falls , S/p Hip Fracture , GI bleed, CKD, and Dementia with behavior Issues.And recent diagnosis with Hyperthyroidism On treatment  Patient has had Behavior issues for more then a year. She was recently transferred to our service and I was tapering her off Zyprexa and trying Seroquel. Also has changed her Xanax to Ativan. She had done well for few days but  yesterday she was up at 2 am and was screaming through the Night. She was also screaming in the afternoon yesterday. Patients close to her room cannot sleep either as she cries and screams all night.  Patient was quieter today and looked sleepy as she had not slept previous night. Has not had any Fever or any other changes. Unable to give ma any history due ti her dementia. Only says I am tired. She was recently treated for UTI  Past Medical History:  Diagnosis Date  . Arthritis   . Cancer ([place])    Rt Breast  . Coronary artery disease   . Cyst of breast    Right - at 15 yrs of age.  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 Diabetes mellitus without complication ([place])   . Diverticulitis   . Elevated cholesterol   . GERD (gastroesophageal reflux disease)   . Hardening of the arteries of the brain   . Heart disease   . Herniated disc   . Hx Breast cancer, DCIS, Right 08/19/2010  . Hypertension   . Migraine headache   . SBO (small bowel obstruction) ([place]) 09/02/11  . Shingles   . Stroke [facility]) 2004   cerebral hemmorhage   Past Surgical History:  Procedure Laterality Date  . BRAIN SURGERY     hemorrhage  . BREAST SURGERY  09/25/2010   mastectomy  . CYSTECTOMY    . ESOPHAGOGASTRODUODENOSCOPY N/A 11/13/2015   Procedure: ESOPHAGOGASTRODUODENOSCOPY (EGD);  Surgeon:  Rogene Houston, MD;  Location: [facility] SU[facility];  Service: Endoscopy;  Laterality: N/A;  . fibroid tumors    . KNEE ARTHROSCOPY Right    right knee x 3  . LAPAROSCOPIC BILATERAL SALPINGO OOPHERECTOMY    . SPINE SURGERY     repair herniated disc  . TOTAL HIP ARTHROPLASTY Right 11/26/2015   Procedure: HEMI HIP ARTHROPLASTY ANTERIOR APPROACH;  Surgeon: Rod Can, MD;  Location: [facility] ORS;  Service: Orthopedics;  Laterality: Right;    Allergies  Allergen Reactions  . Tape Rash  . Ciprofloxacin   . Sulfa Antibiotics Hives  . Codeine Nausea Only    nausea.  . Morphine And Related  Nausea Only    Nausea only.    Outpatient Encounter Prescriptions as of 01/20/2017  Medication Sig  . acetaminophen (TYLENOL) 325 MG tablet Take 650 mg by mouth every 6 (six) hours as needed.  Marland Kitchen apixaban (ELIQUIS) 2.5 MG TABS tablet Take 1 tablet (2.5 mg total) by mouth 2 (two) times daily.  Roseanne Kaufman Peru-Castor Oil (VENELEX) OINT Apply to sacrum and bilateral buttocks q shift and prn  . Dextromethorphan-Guaifenesin 10-100 MG/5ML liquid Take 15 mLs by mouth every 6 (six) hours as needed.  . gabapentin (NEURONTIN) 100 MG capsule Take 1 capsule (100 mg total) by mouth 2 (two) times daily.  Marland Kitchen ipratropium-albuterol (DUONEB) 0.5-2.5 (3) MG/3ML SOLN Take 3 mLs by nebulization 4 (four) times daily as needed.  Marland Kitchen LORazepam (ATIVAN) 0.5 MG tablet Take 0.25 mg by mouth at bedtime.  Marland Kitchen LORazepam (ATIVAN) 0.5 MG tablet Take 0.5 mg by mouth 3 (three) times daily as needed for anxiety.  Marland Kitchen LORazepam (ATIVAN) 0.5 MG tablet Take 0.5 mg by mouth daily.  Marland Kitchen losartan (COZAAR) 50 MG tablet Take 1 tablet (50 mg total) by mouth daily.  . methimazole (TAPAZOLE) 5 MG tablet Take 1 tablet (5 mg total) by mouth daily.  . metoprolol 75 MG TABS Take 75 mg by mouth 2 (two) times daily.  . mirtazapine (REMERON) 7.5 MG tablet Take 7.5 mg by mouth at bedtime.  . pantoprazole (PROTONIX) 40 MG tablet Take 40 mg by mouth daily.  . QUEtiapine (SEROQUEL) 25 MG tablet Take 25 mg by mouth 2 (two) times daily.   Marland Kitchen senna (SENOKOT) 8.6 MG TABS tablet Take 1 tablet by mouth at bedtime.  . sertraline (ZOLOFT) 50 MG tablet Take 50 mg by mouth daily.  Marland Kitchen trimethoprim (TRIMPEX) 100 MG tablet Take 100 mg by mouth at bedtime.  . [DISCONTINUED] ALPRAZolam (XANAX) 0.25 MG tablet Take 0.25 mg by mouth at bedtime as needed for anxiety. Give 1 tablet by mouth three times a day  . [DISCONTINUED] cefdinir (OMNICEF) 300 MG capsule Take 300 mg by mouth 2 (two) times daily.  . [DISCONTINUED] OLANZapine (ZYPREXA) 2.5 MG tablet Take 2.5 mg by mouth at  bedtime.  . [DISCONTINUED] Probiotic Product (RISA-BID PROBIOTIC) TABS Take 1 tablet by mouth twice a day from 12/31/2016-01/10/2017   No facility-administered encounter medications on file as of 01/20/2017.      Review of Systems  Unable to perform ROS: Dementia    Immunization History  Administered Date(s) Administered  . Influenza-Unspecified 12/24/2016  . Pneumococcal Conjugate-13 12/24/2016  . Pneumococcal Polysaccharide-23 12/30/2015   Pertinent  Health Maintenance Due  Topic Date Due  . OPHTHALMOLOGY EXAM  02/19/2017 (Originally 03/22/1934)  . DEXA SCAN  02/19/2017 (Originally 03/23/1991)  . FOOT EXAM  03/18/2017  . HEMOGLOBIN A1C  05/30/2017  . URINE MICROALBUMIN  12/21/2017  .  INFLUENZA VACCINE  Completed  . PNA vac Low Risk Adult  Completed   Fall Risk  12/23/2016 12/20/2016  Falls in the past year? Yes Yes  Number falls in past yr: 2 or more 2 or more  Injury with Fall? Yes Yes  Risk Factor Category  High Fall Risk -   Functional Status Survey:    Vitals:   01/20/17 1212  BP: 116/70  Pulse: 76  Resp: 18  Temp: (!) 97.3 F (36.3 C)  TempSrc: Oral   There is no height or weight on file to calculate BMI. Physical Exam  Constitutional: She appears well-developed and well-nourished.  HENT:  Head: Normocephalic.  Mouth/Throat: Oropharynx is clear and moist.  Eyes: Pupils are equal, round, and reactive to light.  Neck: Neck supple.  Cardiovascular: Normal rate and normal heart sounds.   Pulmonary/Chest: Effort normal and breath sounds normal. No respiratory distress. She has no wheezes. She has no rales.  Abdominal: Soft. Bowel sounds are normal. She exhibits no distension. There is no tenderness. There is no rebound.  Musculoskeletal: She exhibits no edema.  Neurological: She is alert.  She looks sleepy. Not very coherent with speech today. Does follow some commands  Skin: Skin is warm and dry.    Labs reviewed:  Recent Labs  08/19/16 0700  11/30/16 0702  NA 139 139  K 3.9 4.0  CL 102 105  CO2 27 27  GLUCOSE 99 93  BUN 23* 23*  CREATININE 0.77 0.84  CALCIUM 9.0 8.8*    Recent Labs  11/30/16 0702  AST 21  ALT 21  ALKPHOS 115  BILITOT 0.7  PROT 5.7*  ALBUMIN 3.2*    Recent Labs  08/19/16 0700 11/30/16 0702  WBC 7.6 5.6  NEUTROABS 4.8  --   HGB 13.5 13.1  HCT 43.0 40.8  MCV 82.9 84.5  PLT 138* 125*   Lab Results  Component Value Date   TSH <0.010 (L) 11/30/2016   Lab Results  Component Value Date   HGBA1C 6.0 (H) 11/30/2016   Lab Results  Component Value Date   CHOL 118 09/29/2016   HDL 31 (L) 09/29/2016   LDLCALC 73 09/29/2016   TRIG 68 09/29/2016   CHOLHDL 3.8 09/29/2016    Significant Diagnostic Results in last 30 days:  No results found.  Assessment/Plan  Dementia with Psychosis Increase her Seroquel to 50 mg QHS Continue Afternoon dose of 25 mg. Discontinue Ativan Start her on Restoril 7.5 mg QHS Hold for sedation. Continue PRN ativan as needed for Anxiety. Also will consider starting her on Namenda one she stabilizes. Follow Closely.  Family/ staff Communication:   Labs/tests ordered:

## 2017-01-21 ENCOUNTER — Other Ambulatory Visit: Payer: Self-pay

## 2017-01-21 MED ORDER — LORAZEPAM 0.5 MG PO TABS
0.2500 mg | ORAL_TABLET | Freq: Every day | ORAL | 0 refills | Status: DC
Start: 2017-01-21 — End: 2017-01-25

## 2017-01-21 MED ORDER — TEMAZEPAM 7.5 MG PO CAPS: 7.5000 mg | ORAL_CAPSULE | Freq: Every evening | ORAL | 0 refills | Status: DC | PRN

## 2017-01-21 NOTE — Telephone Encounter (Signed)
RX Fax for [facility]@ [phone]  

## 2017-01-25 ENCOUNTER — Encounter: Payer: Self-pay | Admitting: Internal Medicine

## 2017-01-25 ENCOUNTER — Non-Acute Institutional Stay (SKILLED_NURSING_FACILITY): Payer: Medicare Other | Admitting: Internal Medicine

## 2017-01-25 DIAGNOSIS — I10 Essential (primary) hypertension: Secondary | ICD-10-CM

## 2017-01-25 DIAGNOSIS — E0590 Thyrotoxicosis, unspecified without thyrotoxic crisis or storm: Secondary | ICD-10-CM

## 2017-01-25 DIAGNOSIS — F0391 Unspecified dementia with behavioral disturbance: Secondary | ICD-10-CM

## 2017-01-25 DIAGNOSIS — F0392 Unspecified dementia, unspecified severity, with psychotic disturbance: Secondary | ICD-10-CM

## 2017-01-25 DIAGNOSIS — I63312 Cerebral infarction due to thrombosis of left middle cerebral artery: Secondary | ICD-10-CM | POA: Diagnosis not present

## 2017-01-25 DIAGNOSIS — E118 Type 2 diabetes mellitus with unspecified complications: Secondary | ICD-10-CM

## 2017-01-25 DIAGNOSIS — I482 Chronic atrial fibrillation, unspecified: Secondary | ICD-10-CM

## 2017-01-25 NOTE — Progress Notes (Signed)
Location:   [facility] Room Number: 102/P Place of Service:  SNF (31) Provider:  Kyung Rudd, Rene Kocher, MD  Patient Care Team: Virgie Dad, MD as PCP - General (Internal Medicine) Neldon Mc, MD (General Surgery) Jonnie Kind, MD as Consulting Physician (Obstetrics and Gynecology) Wille Celeste, PA-C as Physician Assistant (Internal Medicine)  Extended Emergency Contact Information Primary Emergency Contact: Alferd Patee, [place] [address] Johnnette Litter of [place] Phone: [phone] Mobile Phone: [phone] Relation: Relative Secondary Emergency Contact: Nevada Crane Address: [address]          [place], [place] [address] Johnnette Litter of [place] Phone: [phone] Relation: Son  Code Status:  DNR Goals of care: Advanced Directive information Advanced Directives 01/25/2017  Does Patient Have a Medical Advance Directive? Yes  Type of Advance Directive Out of facility DNR (pink MOST or yellow form)  Does patient want to make changes to medical advance directive? No - Patient declined  Copy of [place] in Chart? -  Would patient like information on creating a medical advance directive? -  Pre-existing out of facility DNR order (yellow form or pink MOST form) -     Chief Complaint  Patient presents with  . Medical Management of Chronic Issues    Routine Visit  . Acute Visit    Med. Adjust.    HPI:  Pt is a 83 y.o. female seen today for medical management of chronic diseases.    She has h/o CVA in 2016 , Chronic Atrial fibrillation, Hypertension, recurrent UTI , recurrent Falls , S/p Hip Fracture , GI bleed, CKD,  and Dementia with behavior Issues. Recent diagnosis of Hyperthyroidism on treatment  Patient was seen today for routine visit. She continues to have behavior issues and insomnia. Per Nurses patient starts screaming at around 3 pm. And then she gets up at 2 am and screams. She was  screaming in her room with her sitter right now. I have slowly increased her Seroquel but there has not been much difference in her behavior.Her room neighbors are unable to sleep through the night as she screams. Patient cannot give any history. No Other Nursing issues. Appetite stays good. And her weight is 122 lbs which is stable.   Past Medical History:  Diagnosis Date  . Arthritis   . Cancer ([place])    Rt Breast  . Coronary artery disease   . Cyst of breast    Right - at 26 yrs of age.  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 Diabetes mellitus without complication ([place])   . Diverticulitis   . Elevated cholesterol   . GERD (gastroesophageal reflux disease)   . Hardening of the arteries of the brain   . Heart disease   . Herniated disc   . Hx Breast cancer, DCIS, Right 08/19/2010  . Hypertension   . Migraine headache   . SBO (small bowel obstruction) ([place]) 09/02/11  . Shingles   . Stroke [facility]) 2004   cerebral hemmorhage   Past Surgical History:  Procedure Laterality Date  . BRAIN SURGERY     hemorrhage  . BREAST SURGERY  09/25/2010   mastectomy  . CYSTECTOMY    . fibroid tumors    . KNEE ARTHROSCOPY Right    right knee x 3  . LAPAROSCOPIC BILATERAL SALPINGO OOPHERECTOMY    . SPINE  SURGERY     repair herniated disc    Allergies  Allergen Reactions  . Tape Rash  . Ciprofloxacin   . Sulfa Antibiotics Hives  . Codeine Nausea Only    nausea.  . Morphine And Related Nausea Only    Nausea only.    Outpatient Encounter Medications as of 01/25/2017  Medication Sig  . acetaminophen (TYLENOL) 325 MG tablet Take 650 mg by mouth every 6 (six) hours as needed.  Marland Kitchen apixaban (ELIQUIS) 2.5 MG TABS tablet Take 1 tablet (2.5 mg total) by mouth 2 (two) times daily.  Roseanne Kaufman Peru-Castor Oil (VENELEX) OINT Apply to sacrum and bilateral buttocks q shift and prn  . Dextromethorphan-Guaifenesin 10-100 MG/5ML liquid Take 15 mLs by mouth every 6  (six) hours as needed.  . gabapentin (NEURONTIN) 100 MG capsule Take 1 capsule (100 mg total) by mouth 2 (two) times daily.  Marland Kitchen ipratropium-albuterol (DUONEB) 0.5-2.5 (3) MG/3ML SOLN Take 3 mLs by nebulization 4 (four) times daily as needed.  Marland Kitchen LORazepam (ATIVAN) 0.5 MG tablet Take 0.5 mg by mouth 3 (three) times daily as needed for anxiety.  Marland Kitchen LORazepam (ATIVAN) 0.5 MG tablet Take 0.5 mg by mouth daily.  Marland Kitchen losartan (COZAAR) 50 MG tablet Take 1 tablet (50 mg total) by mouth daily.  . methimazole (TAPAZOLE) 5 MG tablet Take 1 tablet (5 mg total) by mouth daily.  . metoprolol 75 MG TABS Take 75 mg by mouth 2 (two) times daily.  . mirtazapine (REMERON) 7.5 MG tablet Take 7.5 mg by mouth at bedtime.  . pantoprazole (PROTONIX) 40 MG tablet Take 40 mg by mouth daily.  . QUEtiapine (SEROQUEL) 25 MG tablet Take 25 mg by mouth 2 (two) times daily.   Marland Kitchen senna (SENOKOT) 8.6 MG TABS tablet Take 1 tablet by mouth at bedtime.  . sertraline (ZOLOFT) 50 MG tablet Take 50 mg by mouth daily.  . temazepam (RESTORIL) 7.5 MG capsule Take 1 capsule (7.5 mg total) by mouth at bedtime as needed for sleep.  Marland Kitchen trimethoprim (TRIMPEX) 100 MG tablet Take 100 mg by mouth at bedtime.  . [DISCONTINUED] LORazepam (ATIVAN) 0.5 MG tablet Take 0.5 tablets (0.25 mg total) by mouth at bedtime.   No facility-administered encounter medications on file as of 01/25/2017.      Review of Systems  Unable to perform ROS: Dementia    Immunization History  Administered Date(s) Administered  . Influenza-Unspecified 12/24/2016  . Pneumococcal Conjugate-13 12/24/2016  . Pneumococcal Polysaccharide-23 12/30/2015   Pertinent  Health Maintenance Due  Topic Date Due  . OPHTHALMOLOGY EXAM  02/19/2017 (Originally 03/22/1934)  . DEXA SCAN  02/19/2017 (Originally 03/23/1991)  . FOOT EXAM  03/18/2017  . HEMOGLOBIN A1C  05/30/2017  . URINE MICROALBUMIN  12/21/2017  . INFLUENZA VACCINE  Completed  . PNA vac Low Risk Adult  Completed   Fall  Risk  12/23/2016 12/20/2016  Falls in the past year? Yes Yes  Number falls in past yr: 2 or more 2 or more  Injury with Fall? Yes Yes  Risk Factor Category  High Fall Risk -   Functional Status Survey:    Vitals:   01/25/17 1225  BP: 110/78  Pulse: 84  Resp: 20  Temp: 97.8 F (36.6 C)  TempSrc: Oral  SpO2: 99%  Weight: 122 lb (55.3 kg)  Height: 5' (1.524 m)   Body mass index is 23.83 kg/m. Physical Exam  Constitutional: She appears well-developed and well-nourished.  HENT:  Head: Normocephalic.  Mouth/Throat: Oropharynx is clear  and moist.  Face Flushed due to screaming  Eyes: Pupils are equal, round, and reactive to light.  Neck: Neck supple.  Cardiovascular: Normal rate and normal heart sounds.  Pulmonary/Chest: Effort normal and breath sounds normal. No respiratory distress. She has no wheezes.  Abdominal: Soft. Bowel sounds are normal. She exhibits no distension. There is no tenderness. There is no rebound.  Musculoskeletal: She exhibits no edema.  Neurological: She is alert.  Patient will stop Screaming and follows some commands.    Labs reviewed: Recent Labs    08/19/16 0700 11/30/16 0702  NA 139 139  K 3.9 4.0  CL 102 105  CO2 27 27  GLUCOSE 99 93  BUN 23* 23*  CREATININE 0.77 0.84  CALCIUM 9.0 8.8*   Recent Labs    11/30/16 0702  AST 21  ALT 21  ALKPHOS 115  BILITOT 0.7  PROT 5.7*  ALBUMIN 3.2*   Recent Labs    08/19/16 0700 11/30/16 0702  WBC 7.6 5.6  NEUTROABS 4.8  --   HGB 13.5 13.1  HCT 43.0 40.8  MCV 82.9 84.5  PLT 138* 125*   Lab Results  Component Value Date   TSH <0.010 (L) 11/30/2016   Lab Results  Component Value Date   HGBA1C 6.0 (H) 11/30/2016   Lab Results  Component Value Date   CHOL 118 09/29/2016   HDL 31 (L) 09/29/2016   LDLCALC 73 09/29/2016   TRIG 68 09/29/2016   CHOLHDL 3.8 09/29/2016    Significant Diagnostic Results in last 30 days:  No results found.  Assessment/Plan Chronic atrial  fibrillation Patient on Eliquis and rate controlled on Metoprolol  Essential hypertension BP controlled on Losartan and Metoprolol  Type 2 diabetes mellitus Not on any Hypoglycemics. A1C was 6.0 in 09/18  S/P CVA On Eliquis Hyperthyroidism On Methimazole Followed By Dr Dorris Fetch Dementia with behavioral disturbance Will increase her Seroquel to 50 mg BID. Continue PRN ativan. Also Increase her Restoril to 15 mg Continue Zoloft and Remeron Will see if Psych can see her and help with Psychotropic drugs management  Recurrent UTI On Trimethoprim     Family/ staff Communication:   Labs/tests ordered:    Total time spent in this patient care encounter was 25_ minutes; greater than 50% of the visit spent counseling patient, reviewing records , Labs and coordinating care for problems addressed at this encounter.

## 2017-01-26 ENCOUNTER — Other Ambulatory Visit: Payer: Self-pay

## 2017-01-26 MED ORDER — TEMAZEPAM 15 MG PO CAPS: 15.0000 mg | ORAL_CAPSULE | Freq: Every evening | ORAL | 0 refills | Status: DC | PRN

## 2017-01-26 NOTE — Telephone Encounter (Signed)
RX Fax for [facility]@ [phone]  

## 2017-02-07 ENCOUNTER — Encounter: Payer: Self-pay | Admitting: Internal Medicine

## 2017-02-07 ENCOUNTER — Non-Acute Institutional Stay (SKILLED_NURSING_FACILITY): Payer: Medicare Other | Admitting: Internal Medicine

## 2017-02-07 DIAGNOSIS — F0392 Unspecified dementia, unspecified severity, with psychotic disturbance: Secondary | ICD-10-CM

## 2017-02-07 DIAGNOSIS — R059 Cough, unspecified: Secondary | ICD-10-CM

## 2017-02-07 DIAGNOSIS — I482 Chronic atrial fibrillation, unspecified: Secondary | ICD-10-CM

## 2017-02-07 DIAGNOSIS — F0391 Unspecified dementia with behavioral disturbance: Secondary | ICD-10-CM | POA: Diagnosis not present

## 2017-02-07 DIAGNOSIS — R0989 Other specified symptoms and signs involving the circulatory and respiratory systems: Secondary | ICD-10-CM | POA: Diagnosis not present

## 2017-02-07 DIAGNOSIS — R05 Cough: Secondary | ICD-10-CM | POA: Diagnosis not present

## 2017-02-07 NOTE — Progress Notes (Signed)
This is an acute visit.  Level of CARE scale.  Facility  is [facility], operational complaint-acute visit secondary to cough  \  History of present illness.  Patient is a 83 year old female who apparently developed a cough over the weekend-   vital signs were stable she is afebrile-  She is a poor historian secondary to dementia  She has a history of dementia with behavioral disturbance he is on Seroquel as well as as needed Ativan is also on routine Restoril at night.  She also has a history of hypertension which appears to be controlled on losartan and metoprolol- she is on Eliquis with a history of atrial fibrillation she is on metoprolol for rate control.  She also has a history of hypothyroidism is on methimazole and followed by endocrinology.  In regards to cough which she is receiving duo nebs as needed 4 times a day is also receiving as needed Robitussin.  We have ordered a chest x-ray which has come back negative for any acute process  .  Currently she is resting in bed comfortably continues to have is discontinued fai is painrly persistent episodes  Past Medical History:  Diagnosis Date  . Arthritis   . Cancer ([place])    Rt Breast  . Coronary artery disease   . Cyst of breast    Right - at 66 yrs of age.  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 Diabetes mellitus without complication ([place])   . Diverticulitis   . Elevated cholesterol   . GERD (gastroesophageal reflux disease)   . Hardening of the arteries of the brain   . Heart disease   . Herniated disc   . Hx Breast cancer, DCIS, Right 08/19/2010  . Hypertension   . Migraine headache   . SBO (small bowel obstruction) ([place]) 09/02/11  . Shingles   . Stroke [facility]) 2004   cerebral hemmorhage        Past Surgical History:  Procedure Laterality Date  . BRAIN SURGERY     hemorrhage  . BREAST SURGERY  09/25/2010   mastectomy  . CYSTECTOMY      . fibroid tumors    . KNEE ARTHROSCOPY Right    right knee x 3  . LAPAROSCOPIC BILATERAL SALPINGO OOPHERECTOMY    . SPINE SURGERY     repair herniated disc         Allergies  Allergen Reactions  . Tape Rash  . Ciprofloxacin   . Sulfa Antibiotics Hives  . Codeine Nausea Only    nausea.  . Morphine And Related Nausea Only    Nausea only.   Medications.  Ativan 0.5 mg 3 times daily as needed and routinely at 2 PM.  Duo nebs 4 times a day as needed.  Eliquis 2.5 mg twice daily.  Neurontin 100 mg twice daily.  Losartan 50 mg every morning.  Methimazole 5 mg every morning.  Lopressor-75 mg twice daily.  Proptotic 40 mg every afternoon.  Remeron 7.5 mg nightly.  Restoril 50 mg nightly.  Robitussin-DM 15 mL every 6 hours as needed.  Senna 8.6 mg nightly.  Seroquel 50 mg nightly as well as q. 1 PM   Trimethoprim 100 mg nightly.  Tylenol 325 mg take 2 tablets equals 650 mg every 6 hours as needed pain fever.  Zoloft 50 mg every morning.  Review of systems-  This is essentially unobtainable secondary to dementia.   Physical  exam.  In general this is a fairly well-nourished elderly female in no distress lying in bed--she continues to yell.  Her skin is warm and dry she is not diaphoretic.  Eyes visual acuity appears to be intact she has prescription lenses sclera and conjunctive are clear.  Oropharynx is clear mucous membranes moist.  Chest poor respiratory effort but could not appreciate any overt congestion there is no labored breathing.  Heart is regular rate and rhythm somewhat difficult to hear  her heart sounds since patient was verbalizing during the exam-per what I could auscultate as well as per radial pulse this was regular with pulse of around 80  Abdomen is soft nontender with positive bowel sounds.  Musculoskeletal is able to move all her extremities x4 but limited exam since she is in bed.  Neurologic appears grossly  intact without lateralizing findings she is alert makes eye contact.    Psych as noted above does have episodes of screaming and verbalizing-she does continue to follow some verbal commands.  Labs.  December 01, 2016-free T3 is 4.7-- free T4 1.78  November 30, 2016.  Hemoglobin A1c 6.0.  Sodium 139 potassium 4 BUN 23 creatinine 0.84.  Albumin 3.2 otherwise liver function tests within normal limits  WBC 5.6 hemoglobin 13.1 platelets 125   Assessment and plan.  1.  Cough- chest x-ray was reassuring showing no acute process- she does not show any signs of respiratory compromise at this time-we will continue as needed Robitussin as well as duo nebs and monitor- monitor vital signs pulse ox every shift for 48 hours to keep an eye on this  #2-chronic atrial fibrillation appears rate controlled on the metoprolol rate of 80 on exam today-is on Eliquis for anticoagulation.  3.  Dementia with behavioral disturbance-she continues on Seroquel 50 mg twice daily as well as as needed Ativan 3 times a day routinely at 2 PM.  Her Seroquel was recently increased Restoril was also increased at nightly to 15mg  a day  Nursing continues to be concerned about her yelling- this was discussed with nursing and Dr. Corrinne Eagle will DC her Ativan and start her on Xanax  0.5 mg 3 times daily as needed and at 2 PM routinely monitor- nursing feels that this was more effective than the [place] we are working on arranging psychiatric outpatient consult      CPT- [phone]

## 2017-02-17 ENCOUNTER — Other Ambulatory Visit (HOSPITAL_COMMUNITY)
Admission: RE | Admit: 2017-02-17 | Discharge: 2017-02-17 | Disposition: A | Discharge status: Home / Self Care | Payer: Medicare Other | Source: Skilled Nursing Facility | Attending: Pulmonary Disease | Admitting: Pulmonary Disease

## 2017-02-17 DIAGNOSIS — N390 Urinary tract infection, site not specified: Secondary | ICD-10-CM | POA: Insufficient documentation

## 2017-02-17 DIAGNOSIS — G4700 Insomnia, unspecified: Secondary | ICD-10-CM | POA: Diagnosis not present

## 2017-02-17 DIAGNOSIS — F0391 Unspecified dementia with behavioral disturbance: Secondary | ICD-10-CM | POA: Diagnosis not present

## 2017-02-17 DIAGNOSIS — F064 Anxiety disorder due to known physiological condition: Secondary | ICD-10-CM | POA: Diagnosis not present

## 2017-02-17 DIAGNOSIS — F331 Major depressive disorder, recurrent, moderate: Secondary | ICD-10-CM | POA: Diagnosis not present

## 2017-02-17 LAB — URINALYSIS, ROUTINE W REFLEX MICROSCOPIC
BACTERIA UA: NONE SEEN
Bilirubin Urine: NEGATIVE
Glucose, UA: NEGATIVE mg/dL
Hgb urine dipstick: NEGATIVE
Ketones, ur: NEGATIVE mg/dL
Nitrite: NEGATIVE
Protein, ur: NEGATIVE mg/dL
SPECIFIC GRAVITY, URINE: 1.02 (ref 1.005–1.030)
pH: 6 (ref 5.0–8.0)

## 2017-02-19 LAB — URINE CULTURE: CULTURE: NO GROWTH

## 2017-03-02 ENCOUNTER — Encounter: Payer: Self-pay | Admitting: Internal Medicine

## 2017-03-02 ENCOUNTER — Non-Acute Institutional Stay (SKILLED_NURSING_FACILITY): Payer: Medicare Other | Admitting: Internal Medicine

## 2017-03-02 DIAGNOSIS — I482 Chronic atrial fibrillation, unspecified: Secondary | ICD-10-CM

## 2017-03-02 DIAGNOSIS — E0590 Thyrotoxicosis, unspecified without thyrotoxic crisis or storm: Secondary | ICD-10-CM

## 2017-03-02 DIAGNOSIS — I10 Essential (primary) hypertension: Secondary | ICD-10-CM

## 2017-03-02 DIAGNOSIS — F0391 Unspecified dementia with behavioral disturbance: Secondary | ICD-10-CM

## 2017-03-02 DIAGNOSIS — I63312 Cerebral infarction due to thrombosis of left middle cerebral artery: Secondary | ICD-10-CM | POA: Diagnosis not present

## 2017-03-02 DIAGNOSIS — F0392 Unspecified dementia, unspecified severity, with psychotic disturbance: Secondary | ICD-10-CM

## 2017-03-02 NOTE — Progress Notes (Signed)
Location:   [facility] Room Number: 102/P Place of Service:  SNF (31) Provider:  Kyung Rudd, Rene Kocher, MD  Patient Care Team: Virgie Dad, MD as PCP - General (Internal Medicine) Neldon Mc, MD (General Surgery) Jonnie Kind, MD as Consulting Physician (Obstetrics and Gynecology) Wille Celeste, PA-C as Physician Assistant (Internal Medicine)  Extended Emergency Contact Information Primary Emergency Contact: Alferd Patee, [place] [address] Johnnette Litter of [place] Phone: [phone] Mobile Phone: [phone] Relation: Relative Secondary Emergency Contact: [place] Crane Address: [address]          [place], [place] [address] Johnnette Litter of [place] Phone: [phone] Relation: Son  Code Status:  DNR Goals of care: Advanced Directive information Advanced Directives 03/02/2017  Does Patient Have a Medical Advance Directive? Yes  Type of Advance Directive Out of facility DNR (pink MOST or yellow form)  Does patient want to make changes to medical advance directive? No - Patient declined  Copy of [place] in Chart? -  Would patient like information on creating a medical advance directive? -  Pre-existing out of facility DNR order (yellow form or pink MOST form) -     Chief Complaint  Patient presents with  . Acute Visit    Behavior    HPI:  Pt is a 83 y.o. female seen today for an acute visit for her Behavior.  Patient is a long-term resident of facility and has a history of CVA in 2016 , Chronic Atrial fibrillation, Hypertension, recurrent UTI , recurrent Falls , S/p Hip Fracture , GI bleed, CKD, recent Diagnosis of Hyperthyroidism and Dementia with behavior Issues.  Patient was seen today per request of the nurses as she continues to have agitated behavior with periods of drowsiness.  She was seen by the psych nurse recently and was started on Depakote 125 mg twice daily.  She is also on  Seroquel, Zoloft, Restoril, Xanax.  None of these medicines have helped patient.  She.  She keeps screaming all day and sometimes at night and then gets exhausted and sleeps. Medically her urine culture has been negative and chest x-ray has been negative. Patient was sitting in a wheelchair unable to give me any history.  She had a sitter next to her who said that she is doing better today but was drowsy yesterday.  There has not been any fever or chills. patient keeps yelling is unable to follow any commands.  Past Medical History:  Diagnosis Date  . Arthritis   . Cancer ([place])    Rt Breast  . Coronary artery disease   . Cyst of breast    Right - at 18 yrs of age.  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 Diabetes mellitus without complication ([place])   . Diverticulitis   . Elevated cholesterol   . GERD (gastroesophageal reflux disease)   . Hardening of the arteries of the brain   . Heart disease   . Herniated disc   . Hx Breast cancer, DCIS, Right 08/19/2010  . Hypertension   . Migraine headache   . SBO (small bowel obstruction) ([place]) 09/02/11  . Shingles   . Stroke [facility]) 2004   cerebral hemmorhage   Past Surgical History:  Procedure Laterality Date  . BRAIN SURGERY     hemorrhage  . BREAST SURGERY  09/25/2010   mastectomy  .  CYSTECTOMY    . ESOPHAGOGASTRODUODENOSCOPY N/A 11/13/2015   Procedure: ESOPHAGOGASTRODUODENOSCOPY (EGD);  Surgeon: Rogene Houston, MD;  Location[facility];  Service: Endoscopy;  Laterality: N/A;  . fibroid tumors    . KNEE ARTHROSCOPY Right    right knee x 3  . LAPAROSCOPIC BILATERAL SALPINGO OOPHERECTOMY    . SPINE SURGERY     repair herniated disc  . TOTAL HIP ARTHROPLASTY Right 11/26/2015   Procedure: HEMI HIP ARTHROPLASTY ANTERIOR APPROACH;  Surgeon: Rod Can, MD;  Location: [facility] ORS;  Service: Orthopedics;  Laterality: Right;    Allergies  Allergen Reactions  . Tape Rash  . Ciprofloxacin   . Sulfa  Antibiotics Hives  . Codeine Nausea Only    nausea.  . Morphine And Related Nausea Only    Nausea only.    Outpatient Encounter Medications as of 03/02/2017  Medication Sig  . acetaminophen (TYLENOL) 325 MG tablet Take 650 mg by mouth every 6 (six) hours as needed.  . ALPRAZolam (XANAX) 0.5 MG tablet Take 0.5 mg by mouth daily.  Marland Kitchen ALPRAZolam (XANAX) 0.5 MG tablet Take 0.5 mg by mouth 3 (three) times daily as needed for anxiety.  Marland Kitchen apixaban (ELIQUIS) 2.5 MG TABS tablet Take 1 tablet (2.5 mg total) by mouth 2 (two) times daily.  Roseanne Kaufman Peru-Castor Oil (VENELEX) OINT Apply to sacrum and bilateral buttocks q shift and prn  . Dextromethorphan-Guaifenesin 10-100 MG/5ML liquid Take 15 mLs by mouth every 6 (six) hours as needed.  . divalproex (DEPAKOTE SPRINKLE) 125 MG capsule Take 125 mg by mouth 2 (two) times daily.  Marland Kitchen gabapentin (NEURONTIN) 100 MG capsule Take 1 capsule (100 mg total) by mouth 2 (two) times daily.  Marland Kitchen ipratropium-albuterol (DUONEB) 0.5-2.5 (3) MG/3ML SOLN Take 3 mLs by nebulization 4 (four) times daily as needed.  Marland Kitchen losartan (COZAAR) 50 MG tablet Take 1 tablet (50 mg total) by mouth daily.  . methimazole (TAPAZOLE) 5 MG tablet Take 1 tablet (5 mg total) by mouth daily.  . metoprolol 75 MG TABS Take 75 mg by mouth 2 (two) times daily.  . mirtazapine (REMERON) 7.5 MG tablet Take 7.5 mg by mouth at bedtime.  . pantoprazole (PROTONIX) 40 MG tablet Take 40 mg by mouth daily.  . QUEtiapine (SEROQUEL) 25 MG tablet Take 50 mg by mouth 2 (two) times daily.   Marland Kitchen senna (SENOKOT) 8.6 MG TABS tablet Take 1 tablet by mouth at bedtime.  . sertraline (ZOLOFT) 50 MG tablet Take 50 mg by mouth daily.  . temazepam (RESTORIL) 15 MG capsule Take 1 capsule (15 mg total) at bedtime as needed by mouth for sleep.  Marland Kitchen trimethoprim (TRIMPEX) 100 MG tablet Take 100 mg by mouth at bedtime.  . [DISCONTINUED] LORazepam (ATIVAN) 0.5 MG tablet Take 0.5 mg by mouth 3 (three) times daily as needed for anxiety.    . [DISCONTINUED] LORazepam (ATIVAN) 0.5 MG tablet Take 0.5 mg by mouth daily.  . [DISCONTINUED] temazepam (RESTORIL) 7.5 MG capsule Take 1 capsule (7.5 mg total) by mouth at bedtime as needed for sleep.   No facility-administered encounter medications on file as of 03/02/2017.      Review of Systems  Unable to perform ROS: Dementia    Immunization History  Administered Date(s) Administered  . Influenza-Unspecified 12/24/2016  . Pneumococcal Conjugate-13 12/24/2016  . Pneumococcal Polysaccharide-23 12/30/2015   Pertinent  Health Maintenance Due  Topic Date Due  . OPHTHALMOLOGY EXAM  03/22/1934  . DEXA SCAN  03/23/1991  . FOOT EXAM  03/18/2017  . HEMOGLOBIN A1C  05/30/2017  . URINE MICROALBUMIN  12/21/2017  . INFLUENZA VACCINE  Completed  . PNA vac Low Risk Adult  Completed   Fall Risk  12/23/2016 12/20/2016  Falls in the past year? Yes Yes  Number falls in past yr: 2 or more 2 or more  Injury with Fall? Yes Yes  Risk Factor Category  High Fall Risk -   Functional Status Survey:    Vitals:   03/02/17 1202  BP: 125/76  Pulse: 74  Resp: 18  Temp: (!) 97.4 F (36.3 C)  TempSrc: Oral  SpO2: 99%   There is no height or weight on file to calculate BMI. Physical Exam  Constitutional: She appears well-developed and well-nourished.  HENT:  Head: Normocephalic.  Eyes: Pupils are equal, round, and reactive to light.  Neck: Neck supple.  Cardiovascular: Normal rate and normal heart sounds.  No murmur heard. Pulmonary/Chest: Effort normal and breath sounds normal. No respiratory distress. She has no wheezes. She has no rales.  Abdominal: Soft. Bowel sounds are normal. She exhibits no distension. There is no tenderness. There is no rebound.  Musculoskeletal: She exhibits no edema.  Lymphadenopathy:    She has no cervical adenopathy.  Neurological: She is alert.  Responds to her Name but does not follow any Commands.  Skin: Skin is warm and dry.  Psychiatric: Her mood  appears anxious. Her speech is slurred. She is agitated. Cognition and memory are impaired.    Labs reviewed: Recent Labs    08/19/16 0700 11/30/16 0702  NA 139 139  K 3.9 4.0  CL 102 105  CO2 27 27  GLUCOSE 99 93  BUN 23* 23*  CREATININE 0.77 0.84  CALCIUM 9.0 8.8*   Recent Labs    11/30/16 0702  AST 21  ALT 21  ALKPHOS 115  BILITOT 0.7  PROT 5.7*  ALBUMIN 3.2*   Recent Labs    08/19/16 0700 11/30/16 0702  WBC 7.6 5.6  NEUTROABS 4.8  --   HGB 13.5 13.1  HCT 43.0 40.8  MCV 82.9 84.5  PLT 138* 125*   Lab Results  Component Value Date   TSH <0.010 (L) 11/30/2016   Lab Results  Component Value Date   HGBA1C 6.0 (H) 11/30/2016   Lab Results  Component Value Date   CHOL 118 09/29/2016   HDL 31 (L) 09/29/2016   LDLCALC 73 09/29/2016   TRIG 68 09/29/2016   CHOLHDL 3.8 09/29/2016    Significant Diagnostic Results in last 30 days:  No results found.  Assessment/Plan Dementia with behavioral issues I had a long discussion about her with her Niece who has power of attorney. We both agreed that patient has worsened over the past few months.  At this stage she is not interested in any kind of aggressive treatment or test. She just wants her to be comfortable.  Though we are giving her Xanax as needed but sometimes That does not help  her either  Psych note will increase her Depakote to 125 mg 3 times daily With discontinue Restoril as it has not helped her We will continue Xanax as needed We will also discontinue Remeron Will do Basic Labs of CMP and CBC  Chronic atrial fibrillation She is on low-dose Eliquis and her rate is controlled on metoprolol  Hypertension Blood pressure is controlled on losartan and Metoprolol  Status post CVA Thought to be due to A Fib She has been stable on Eliquis.   Hypothyroidism  On methimazole.  Follows with endocrinology History of Recurrent UTI On trimethoprim  Family/ staff Communication:   Labs/tests  ordered:   Total time spent in this patient care encounter was 45_ minutes; greater than 50% of the visit spent counseling patient Neice, reviewing records , Labs and coordinating care for problems addressed at this encounter.

## 2017-03-03 ENCOUNTER — Encounter (HOSPITAL_COMMUNITY)
Admission: RE | Admit: 2017-03-03 | Discharge: 2017-03-03 | Disposition: A | Discharge status: Home / Self Care | Payer: Medicare Other | Source: Skilled Nursing Facility | Attending: Internal Medicine | Admitting: Internal Medicine

## 2017-03-03 DIAGNOSIS — B9620 Unspecified Escherichia coli [E. coli] as the cause of diseases classified elsewhere: Secondary | ICD-10-CM | POA: Insufficient documentation

## 2017-03-03 DIAGNOSIS — I639 Cerebral infarction, unspecified: Secondary | ICD-10-CM | POA: Diagnosis not present

## 2017-03-03 DIAGNOSIS — Z471 Aftercare following joint replacement surgery: Secondary | ICD-10-CM | POA: Insufficient documentation

## 2017-03-03 DIAGNOSIS — N390 Urinary tract infection, site not specified: Secondary | ICD-10-CM | POA: Diagnosis not present

## 2017-03-03 LAB — CBC
HEMATOCRIT: 46.4 % — AB (ref 36.0–46.0)
Hemoglobin: 14.5 g/dL (ref 12.0–15.0)
MCH: 27.7 pg (ref 26.0–34.0)
MCHC: 31.3 g/dL (ref 30.0–36.0)
MCV: 88.5 fL (ref 78.0–100.0)
Platelets: 187 10*3/uL (ref 150–400)
RBC: 5.24 MIL/uL — ABNORMAL HIGH (ref 3.87–5.11)
RDW: 15.1 % (ref 11.5–15.5)
WBC: 6.2 10*3/uL (ref 4.0–10.5)

## 2017-03-03 LAB — COMPREHENSIVE METABOLIC PANEL
ALK PHOS: 154 U/L — AB (ref 38–126)
ALT: 19 U/L (ref 14–54)
AST: 25 U/L (ref 15–41)
Albumin: 3.3 g/dL — ABNORMAL LOW (ref 3.5–5.0)
Anion gap: 7 (ref 5–15)
BILIRUBIN TOTAL: 0.4 mg/dL (ref 0.3–1.2)
BUN: 30 mg/dL — AB (ref 6–20)
CALCIUM: 8.9 mg/dL (ref 8.9–10.3)
CO2: 29 mmol/L (ref 22–32)
CREATININE: 0.89 mg/dL (ref 0.44–1.00)
Chloride: 104 mmol/L (ref 101–111)
GFR, EST NON AFRICAN AMERICAN: 55 mL/min — AB (ref >60)
Glucose, Bld: 114 mg/dL — ABNORMAL HIGH (ref 65–99)
Potassium: 3.9 mmol/L (ref 3.5–5.1)
Sodium: 140 mmol/L (ref 135–145)
TOTAL PROTEIN: 5.9 g/dL — AB (ref 6.5–8.1)

## 2017-03-09 ENCOUNTER — Non-Acute Institutional Stay (SKILLED_NURSING_FACILITY): Payer: Medicare Other | Admitting: Internal Medicine

## 2017-03-09 ENCOUNTER — Other Ambulatory Visit (HOSPITAL_COMMUNITY)
Admission: AD | Admit: 2017-03-09 | Discharge: 2017-03-09 | Disposition: A | Discharge status: Home / Self Care | Payer: Medicare Other | Source: Skilled Nursing Facility | Attending: Internal Medicine | Admitting: Internal Medicine

## 2017-03-09 DIAGNOSIS — N390 Urinary tract infection, site not specified: Secondary | ICD-10-CM | POA: Insufficient documentation

## 2017-03-09 DIAGNOSIS — R1110 Vomiting, unspecified: Secondary | ICD-10-CM | POA: Diagnosis not present

## 2017-03-09 DIAGNOSIS — I482 Chronic atrial fibrillation: Secondary | ICD-10-CM | POA: Diagnosis not present

## 2017-03-09 DIAGNOSIS — R0689 Other abnormalities of breathing: Secondary | ICD-10-CM | POA: Diagnosis not present

## 2017-03-09 LAB — CBC WITH DIFFERENTIAL/PLATELET
Basophils Absolute: 0 10*3/uL (ref 0.0–0.1)
Basophils Relative: 0 %
EOS ABS: 0.2 10*3/uL (ref 0.0–0.7)
Eosinophils Relative: 2 %
HEMATOCRIT: 46 % (ref 36.0–46.0)
HEMOGLOBIN: 14.6 g/dL (ref 12.0–15.0)
LYMPHS ABS: 1.8 10*3/uL (ref 0.7–4.0)
LYMPHS PCT: 16 %
MCH: 27.8 pg (ref 26.0–34.0)
MCHC: 31.7 g/dL (ref 30.0–36.0)
MCV: 87.6 fL (ref 78.0–100.0)
Monocytes Absolute: 0.9 10*3/uL (ref 0.1–1.0)
Monocytes Relative: 8 %
NEUTROS ABS: 8.4 10*3/uL — AB (ref 1.7–7.7)
NEUTROS PCT: 74 %
Platelets: 226 10*3/uL (ref 150–400)
RBC: 5.25 MIL/uL — AB (ref 3.87–5.11)
RDW: 15.1 % (ref 11.5–15.5)
WBC: 11.3 10*3/uL — AB (ref 4.0–10.5)

## 2017-03-09 LAB — URINALYSIS, ROUTINE W REFLEX MICROSCOPIC
Bilirubin Urine: NEGATIVE
Glucose, UA: NEGATIVE mg/dL
Hgb urine dipstick: NEGATIVE
Ketones, ur: 5 mg/dL — AB
Nitrite: POSITIVE — AB
Protein, ur: NEGATIVE mg/dL
SPECIFIC GRAVITY, URINE: 1.02 (ref 1.005–1.030)
pH: 5 (ref 5.0–8.0)

## 2017-03-09 LAB — COMPREHENSIVE METABOLIC PANEL
ALK PHOS: 143 U/L — AB (ref 38–126)
ALT: 24 U/L (ref 14–54)
ANION GAP: 11 (ref 5–15)
AST: 30 U/L (ref 15–41)
Albumin: 3.5 g/dL (ref 3.5–5.0)
BILIRUBIN TOTAL: 0.5 mg/dL (ref 0.3–1.2)
BUN: 29 mg/dL — ABNORMAL HIGH (ref 6–20)
CALCIUM: 9.1 mg/dL (ref 8.9–10.3)
CO2: 26 mmol/L (ref 22–32)
CREATININE: 1 mg/dL (ref 0.44–1.00)
Chloride: 105 mmol/L (ref 101–111)
GFR calc non Af Amer: 48 mL/min — ABNORMAL LOW (ref >60)
GFR, EST AFRICAN AMERICAN: 56 mL/min — AB (ref >60)
GLUCOSE: 119 mg/dL — AB (ref 65–99)
Potassium: 4.1 mmol/L (ref 3.5–5.1)
SODIUM: 142 mmol/L (ref 135–145)
TOTAL PROTEIN: 6.4 g/dL — AB (ref 6.5–8.1)

## 2017-03-09 NOTE — Progress Notes (Signed)
This is an acute visit.  Level of care skilled.  Facility is [facility].  Chief complaint-acute visit secondary to vomiting.  History of present illness.  The patient is a 83 year old female who is a long-term resident facility with a history of CVA in 2016 as well as atrial fibrillation hypertension recurrent UTI with falls as well as history of a hip fracture GI bleed chronic kidney disease recent diagnosis hypothyroidism and dementia with behavior issues.  Apparently earlier this evening she had a vomiting episode of undigested food.  Apparently this was somewhat sudden in onset-according to her sitter she did not eat much at all at supper.  After the vomiting subsided she appeared to be back at her baseline confused at times yelling but that is not anything new and pretty much baseline she is followed by psychiatric services and is on numerous medications including Depakote-Seroquel at night as well as 1 PM and Xanax routinely at 2 PM and as needed 3 times a day she is also on Zoloft.  Vital signs appear to be stable O2 saturation is in the 90s on room air she is afebrile.  When I examined her just after the vomiting episode she appeared to be resting comfortably and at her baseline  According to nursing she also had a bowel movement while vomiting.  Past Medical History:  Diagnosis Date  . Arthritis   . Cancer ([place])    Rt Breast  . Coronary artery disease   . Cyst of breast    Right - at 29 yrs of age.  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 Diabetes mellitus without complication ([place])   . Diverticulitis   . Elevated cholesterol   . GERD (gastroesophageal reflux disease)   . Hardening of the arteries of the brain   . Heart disease   . Herniated disc   . Hx Breast cancer, DCIS, Right 08/19/2010  . Hypertension   . Migraine headache   . SBO (small bowel obstruction) ([place]) 09/02/11  . Shingles   .  Stroke [facility]) 2004   cerebral hemmorhage        Past Surgical History:  Procedure Laterality Date  . BRAIN SURGERY     hemorrhage  . BREAST SURGERY  09/25/2010   mastectomy  . CYSTECTOMY    . ESOPHAGOGASTRODUODENOSCOPY N/A 11/13/2015   Procedure: ESOPHAGOGASTRODUODENOSCOPY (EGD);  Surgeon: Rogene Houston, MD;  [facility];  Service: Endoscopy;  Laterality: N/A;  . fibroid tumors    . KNEE ARTHROSCOPY Right    right knee x 3  . LAPAROSCOPIC BILATERAL SALPINGO OOPHERECTOMY    . SPINE SURGERY     repair herniated disc  . TOTAL HIP ARTHROPLASTY Right 11/26/2015   Procedure: HEMI HIP ARTHROPLASTY ANTERIOR APPROACH;  Surgeon: Rod Can, MD;  Location: [facility];  Service: Orthopedics;  Laterality: Right;         Allergies  Allergen Reactions  . Tape Rash  . Ciprofloxacin   . Sulfa Antibiotics Hives  . Codeine Nausea Only    nausea.  . Morphine And Related Nausea Only    Nausea only.           Medication Sig  . acetaminophen (TYLENOL) 325 MG tablet Take 650 mg by mouth every 6 (six) hours as needed.  . ALPRAZolam (XANAX) 0.5 MG tablet Take 0.5 mg by mouth daily.  Marland Kitchen ALPRAZolam (XANAX) 0.5 MG tablet Take  0.5 mg by mouth 3 (three) times daily as needed for anxiety.  Marland Kitchen apixaban (ELIQUIS) 2.5 MG TABS tablet Take 1 tablet (2.5 mg total) by mouth 2 (two) times daily.  Roseanne Kaufman [place]-Castor Oil (VENELEX) OINT Apply to sacrum and bilateral buttocks q shift and prn  . Dextromethorphan-Guaifenesin 10-100 MG/5ML liquid Take 15 mLs by mouth every 6 (six) hours as needed.  . divalproex (DEPAKOTE SPRINKLE) 125 MG capsule Take 125 mg by mouth 2 (two) times daily.  Marland Kitchen gabapentin (NEURONTIN) 100 MG capsule Take 1 capsule (100 mg total) by mouth 2 (two) times daily.  Marland Kitchen ipratropium-albuterol (DUONEB) 0.5-2.5 (3) MG/3ML SOLN Take 3 mLs by nebulization 4 (four) times daily as needed.  Marland Kitchen losartan (COZAAR) 50 MG tablet Take 1 tablet (50 mg total) by mouth daily.    . methimazole (TAPAZOLE) 5 MG tablet Take 1 tablet (5 mg total) by mouth daily.  . metoprolol 75 MG TABS Take 75 mg by mouth 2 (two) times daily.  . mirtazapine (REMERON) 7.5 MG tablet Take 7.5 mg by mouth at bedtime.  . pantoprazole (PROTONIX) 40 MG tablet Take 40 mg by mouth daily.  . QUEtiapine (SEROQUEL) 25 MG tablet Take 50 mg by mouth 2 (two) times daily.   Marland Kitchen senna (SENOKOT) 8.6 MG TABS tablet Take 1 tablet by mouth at bedtime.  . sertraline (ZOLOFT) 50 MG tablet Take 50 mg by mouth daily.  . temazepam (RESTORIL) 15 MG capsule Take 1 capsule (15 mg total) at bedtime as needed by mouth for sleep.  Marland Kitchen trimethoprim (TRIMPEX) 100 MG tablet Take 100 mg by mouth at bedtime.  . [DISCONTINUED] LORazepam (ATIVAN) 0.5 MG tablet Take 0.5 mg by mouth 3 (three) times daily as needed for anxiety.  . [DISCONTINUED] LORazepam (ATIVAN) 0.5 MG tablet Take 0.5 mg by mouth daily.  . [DISCONTINUED] temazepam (RESTORIL) 7.5 MG capsule Take 1 capsule (7.5 mg total) by mouth at bedtime as needed for sleep.   Review of systems essentially unobtainable secondary to dementia please see HPI again she appears to be resting comfortably in bed currently   Physical exam.  Temperature is 98.6 pulse 80 respirations 18 blood pressure 138/79.  In general this is a somewhat frail appearing elderly female in no distress lying in bed.  Her skin is warm and dry she is not diaphoretic.  Oropharynx is clear mucous membranes moist.  Chest s Somewhat shallow air entry with somewhat poor respiratory effort but could not really appreciate any overt congestion some mild bronchial sounds diffuse.  Heart is regular irregular rate and rhythm without murmur gallop or rub.  Abdomen is soft does not appear to be tender bowel sounds are active.  GU could not really appreciate suprapubic tenderness.  Musculoskeletal appears able to move all her extremities at baseline   Neurologic is grossly intact she is alert makes eye  contact continues to be confused however which is her baseline.  Labs.  March 03 2017.  WBC 6.2 hemoglobin 14.5 platelets 187.  Sodium 140 potassium 3.9 BUN 38 creatinine 0.89.  Liver functions normal except for alkaline phosphatase mildly elevated at 154.  Assessment plan.  1.  Vomiting episode- she appears to be stable now we will monitor with vital signs every 4 hours with pulse ox-also secondary to aspiration risk will order a chest x-ray two-view and also will obtain a urinalysis and culture.  Also will order a stat CBC with differential as well as a CMP for updated values.   Also will order  a clear liquid diet for now.   I did reevaluate patient before I left the facility approximatelyan hour later she was still resting in bed comfortably physical exam was relatively unchanged no further vomiting episodes  Later I did review the lab results via epic it did show a mildly elevated white count of 11.3 with neutrophils absolute slightly elevated at 8.4-hemoglobin was stable at 14.6 platelets 226  Sodium was 142 potassium 4.1 BUN 29 creatinine 1.0.  Alkaline phosphatase again was minimally elevated at 143.  Urinalysis showed few bacteria WBCs too numerous to count positive nitrite moderate leukocytes.  Will await final urine culture and can continue to monitor for any change in status.  As well as await chest x-ray results.  Later in the evening I did call facility to check and resident was stable per nursing no further vomiting episodes  [id number]-of note greater than 40 minutes spent assessing patient-reassessing patient- reviewing her chart-reviewing her labs- and coordinating a plan of care in facility as well as later in the evening via phone.        Addendum.

## 2017-03-10 ENCOUNTER — Non-Acute Institutional Stay (SKILLED_NURSING_FACILITY): Payer: Medicare Other | Admitting: Internal Medicine

## 2017-03-10 ENCOUNTER — Encounter: Payer: Self-pay | Admitting: Internal Medicine

## 2017-03-10 DIAGNOSIS — F064 Anxiety disorder due to known physiological condition: Secondary | ICD-10-CM | POA: Diagnosis not present

## 2017-03-10 DIAGNOSIS — I482 Chronic atrial fibrillation, unspecified: Secondary | ICD-10-CM

## 2017-03-10 DIAGNOSIS — D72829 Elevated white blood cell count, unspecified: Secondary | ICD-10-CM | POA: Diagnosis not present

## 2017-03-10 DIAGNOSIS — I10 Essential (primary) hypertension: Secondary | ICD-10-CM

## 2017-03-10 DIAGNOSIS — F0391 Unspecified dementia with behavioral disturbance: Secondary | ICD-10-CM | POA: Diagnosis not present

## 2017-03-10 DIAGNOSIS — R1110 Vomiting, unspecified: Secondary | ICD-10-CM | POA: Diagnosis not present

## 2017-03-10 DIAGNOSIS — F331 Major depressive disorder, recurrent, moderate: Secondary | ICD-10-CM | POA: Diagnosis not present

## 2017-03-10 DIAGNOSIS — G4700 Insomnia, unspecified: Secondary | ICD-10-CM | POA: Diagnosis not present

## 2017-03-10 NOTE — Progress Notes (Signed)
Location:   [place] Room Number: 102/P Place of Service:  SNF [phone]) Provider:  Freddi Starr, MD  Patient Care Team: Virgie Dad, MD as PCP - General (Internal Medicine) Neldon Mc, MD (General Surgery) Jonnie Kind, MD as Consulting Physician (Obstetrics and Gynecology) Wille Celeste, PA-C as Physician Assistant (Internal Medicine)  Extended Emergency Contact Information Primary Emergency Contact: Alferd Patee, [place] [address] Johnnette Litter of [place] Phone: [phone] Mobile Phone: [phone] Relation: Relative Secondary Emergency Contact: Nevada Crane Address: [address]          [place], [place] [address] Johnnette Litter of [place] Phone: [phone] Relation: Son  Code Status:  DNR Goals of care: Advanced Directive information Advanced Directives 03/10/2017  Does Patient Have a Medical Advance Directive? Yes  Type of Advance Directive Out of facility DNR (pink MOST or yellow form)  Does patient want to make changes to medical advance directive? No - Patient declined  Copy of [place] in Chart? -  Would patient like information on creating a medical advance directive? -  Pre-existing out of facility DNR order (yellow form or pink MOST form) -     Chief Complaint  Patient presents with  . Acute Visit    Slightly Elevated WBC and Vomiting    HPI:  Pt is a 83 y.o. female seen today for an acute visit for follow-up of vomiting episode yesterday-with mildly elevated white count.  I did see patient last night for single episode of vomiting this appeared to be undigested food- we ordered a chest x-ray to rule out aspiration or pneumonia and this was negative.  Also obtain a urinalysis and culture which show for showing few bacteria with WBCs too numerous to count positive nitrite and moderate leukocyte.  Today she actually ate a good breakfast-there has been no further vomiting  and vital signs remained stable.  Nursing staff says she is essentially at her baseline  She does have a history of atrial fibrillation but this appears rate controlled on metoprolol she is on Eliquis for anticoagulation.  Blood pressure appears well controlled she is on metoprolol as well as Cozaar.  She also has a history of chronic kidney disease but labs done last night were reassuring showing a stable creatinine of 1.0 BUN of 29.  She also has dementia with significant behaviors and yelling episodes-she actually will be seen by psychiatric services today she is on Seroquel as well as Depakote and Xanax routinely once a day as well as as needed 3 times a day.     Past Medical History:  Diagnosis Date  . Arthritis   . Cancer ([place])    Rt Breast  . Coronary artery disease   . Cyst of breast    Right - at 75 yrs of age.  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 Diabetes mellitus without complication ([place])   . Diverticulitis   . Elevated cholesterol   . GERD (gastroesophageal reflux disease)   . Hardening of the arteries of the brain   . Heart disease   . Herniated disc   . Hx Breast cancer, DCIS, Right 08/19/2010  . Hypertension   . Migraine headache   . SBO (small bowel obstruction) ([place]) 09/02/11  . Shingles   . Stroke [facility]) 2004   cerebral hemmorhage   Past Surgical  History:  Procedure Laterality Date  . BRAIN SURGERY     hemorrhage  . BREAST SURGERY  09/25/2010   mastectomy  . CYSTECTOMY    . ESOPHAGOGASTRODUODENOSCOPY N/A 11/13/2015   Procedure: ESOPHAGOGASTRODUODENOSCOPY (EGD);  Surgeon: Rogene Houston, MD;  [facility]O [facility];  Service: Endoscopy;  Laterality: N/A;  . fibroid tumors    . KNEE ARTHROSCOPY Right    right knee x 3  . LAPAROSCOPIC BILATERAL SALPINGO OOPHERECTOMY    . SPINE SURGERY     repair herniated disc  . TOTAL HIP ARTHROPLASTY Right 11/26/2015   Procedure: HEMI HIP ARTHROPLASTY ANTERIOR APPROACH;   Surgeon: Rod Can, MD;  Location: [facility] ORS;  Service: Orthopedics;  Laterality: Right;    Allergies  Allergen Reactions  . Tape Rash  . Ciprofloxacin   . Sulfa Antibiotics Hives  . Codeine Nausea Only    nausea.  . Morphine And Related Nausea Only    Nausea only.    Outpatient Encounter Medications as of 03/10/2017  Medication Sig  . acetaminophen (TYLENOL) 325 MG tablet Take 650 mg by mouth every 6 (six) hours as needed.  . ALPRAZolam (XANAX) 0.5 MG tablet Take 0.5 mg by mouth daily.  Marland Kitchen ALPRAZolam (XANAX) 0.5 MG tablet Take 0.5 mg by mouth 3 (three) times daily as needed for anxiety.  Marland Kitchen apixaban (ELIQUIS) 2.5 MG TABS tablet Take 1 tablet (2.5 mg total) by mouth 2 (two) times daily.  Roseanne Kaufman Peru-Castor Oil (VENELEX) OINT Apply to sacrum and bilateral buttocks q shift and prn  . Dextromethorphan-Guaifenesin 10-100 MG/5ML liquid Take 15 mLs by mouth every 6 (six) hours as needed.  . divalproex (DEPAKOTE SPRINKLE) 125 MG capsule Take 125 mg by mouth 3 (three) times daily.   Marland Kitchen gabapentin (NEURONTIN) 100 MG capsule Take 1 capsule (100 mg total) by mouth 2 (two) times daily.  Marland Kitchen ipratropium-albuterol (DUONEB) 0.5-2.5 (3) MG/3ML SOLN Take 3 mLs by nebulization 4 (four) times daily as needed.  Marland Kitchen losartan (COZAAR) 50 MG tablet Take 1 tablet (50 mg total) by mouth daily.  . methimazole (TAPAZOLE) 5 MG tablet Take 1 tablet (5 mg total) by mouth daily.  . metoprolol 75 MG TABS Take 75 mg by mouth 2 (two) times daily.  . pantoprazole (PROTONIX) 40 MG tablet Take 40 mg by mouth daily.  . QUEtiapine (SEROQUEL) 25 MG tablet Take 50 mg by mouth 2 (two) times daily.   Marland Kitchen senna (SENOKOT) 8.6 MG TABS tablet Take 1 tablet by mouth at bedtime.  . sertraline (ZOLOFT) 50 MG tablet Take 50 mg by mouth daily.  Marland Kitchen trimethoprim (TRIMPEX) 100 MG tablet Take 100 mg by mouth at bedtime.  . [DISCONTINUED] mirtazapine (REMERON) 7.5 MG tablet Take 7.5 mg by mouth at bedtime.  . [DISCONTINUED] temazepam (RESTORIL)  15 MG capsule Take 1 capsule (15 mg total) at bedtime as needed by mouth for sleep.   No facility-administered encounter medications on file as of 03/10/2017.     Review of Systems  Unobtainable secondary to dementia please see HPI per nursing she is at her baseline today  Immunization History  Administered Date(s) Administered  . Influenza-Unspecified 12/24/2016  . Pneumococcal Conjugate-13 12/24/2016  . Pneumococcal Polysaccharide-23 12/30/2015  . Tdap 12/24/2016   Pertinent  Health Maintenance Due  Topic Date Due  . OPHTHALMOLOGY EXAM  04/10/2017 (Originally 03/22/1934)  . DEXA SCAN  04/10/2017 (Originally 03/23/1991)  . FOOT EXAM  03/18/2017  . HEMOGLOBIN A1C  05/30/2017  . URINE MICROALBUMIN  12/21/2017  .  INFLUENZA VACCINE  Completed  . PNA vac Low Risk Adult  Completed   Fall Risk  12/23/2016 12/20/2016  Falls in the past year? Yes Yes  Number falls in past yr: 2 or more 2 or more  Injury with Fall? Yes Yes  Risk Factor Category  High Fall Risk -   Functional Status Survey:    Vitals:   03/10/17 1026  BP: 125/73  Pulse: 79  Resp: 20  Temp: 97.6 F (36.4 C)  TempSrc: Oral  SpO2: 96%    Physical Exam  In general this is a somewhat frail elderly femaleIn no distress sitting comfortably in wheelchair she is somewhat lethargic but easily arousable per nursing she was up quite a bit last night so this is not an unusual presentation  Her skin is warm and dry.  Eyes visual acuity appears grossly intact.  Oropharynx is clear mucous membranes moist.  Chest is clear to auscultation with somewhat poor respiratory effort there is no labored breathing.  Heart irregularly irregular rate and rhythm without murmur gallop or rub she has scant lower extremity edema.  Abdomen is soft nontender with positive bowel sounds.  Musculoskeletal is able to move all extremities x4 at baseline has somewhat generalized weakness.  Neurologic is grossly intact she is alert continues to  make eye contact does not speak a lot this morning but at times will become somewhat agitated and yelling.  Psych as noted above she is oriented to self only but will respond somewhat to verbal commands   Labs reviewed: Recent Labs    11/30/16 0702 03/03/17 1040 03/09/17 2000  NA 139 140 142  K 4.0 3.9 4.1  CL 105 104 105  CO2 27 29 26   GLUCOSE 93 114* 119*  BUN 23* 30* 29*  CREATININE 0.84 0.89 1.00  CALCIUM 8.8* 8.9 9.1   Recent Labs    11/30/16 0702 03/03/17 1040 03/09/17 2000  AST 21 25 30   ALT 21 19 24   ALKPHOS 115 154* 143*  BILITOT 0.7 0.4 0.5  PROT 5.7* 5.9* 6.4*  ALBUMIN 3.2* 3.3* 3.5   Recent Labs    08/19/16 0700 11/30/16 0702 03/03/17 1040 03/09/17 2000  WBC 7.6 5.6 6.2 11.3*  NEUTROABS 4.8  --   --  8.4*  HGB 13.5 13.1 14.5 14.6  HCT 43.0 40.8 46.4* 46.0  MCV 82.9 84.5 88.5 87.6  PLT 138* 125* 187 226   Lab Results  Component Value Date   TSH <0.010 (L) 11/30/2016   Lab Results  Component Value Date   HGBA1C 6.0 (H) 11/30/2016   Lab Results  Component Value Date   CHOL 118 09/29/2016   HDL 31 (L) 09/29/2016   LDLCALC 73 09/29/2016   TRIG 68 09/29/2016   CHOLHDL 3.8 09/29/2016    Significant Diagnostic Results in last 30 days:  No results found.  Assessment/Plan  #1-vomiting-this appears to be a single episode no further reports of vomiting later last night or this morning she actually ate a good breakfast and tolerated it well-chest x-ray was benign lab work was fairly benign except for a mildly elevated white count of 11.2- she is afebrile does not certainly give a septic presentation will await urine culture results and monitor vital signs every shift.  Also will update a CBC with differential tomorrow to keep an eye ont white count.  2.  A. fib this appears rate controlled on Lopressor she is on Eliquis for anticoagulation.  3.  Dementia with behaviors again she  is followed by psychiatric services she continues on Depakote as  well as Xanax and Seroquel this has been a challenging issue and psychiatric input has been appreciated.  4.  Hypertension this appears stable on losartan and metoprolol blood pressure last night today was satisfactory.  5.  History of recurrent UTI she is on trimethoprim again will await culture results and update CBC with differential tomorrow as well.    [id number]

## 2017-03-11 ENCOUNTER — Encounter (HOSPITAL_COMMUNITY)
Admission: RE | Admit: 2017-03-11 | Discharge: 2017-03-11 | Disposition: A | Discharge status: Home / Self Care | Payer: Medicare Other | Source: Skilled Nursing Facility | Attending: Internal Medicine | Admitting: Internal Medicine

## 2017-03-11 DIAGNOSIS — F28 Other psychotic disorder not due to a substance or known physiological condition: Secondary | ICD-10-CM | POA: Diagnosis not present

## 2017-03-11 DIAGNOSIS — F0391 Unspecified dementia with behavioral disturbance: Secondary | ICD-10-CM | POA: Insufficient documentation

## 2017-03-11 DIAGNOSIS — M6281 Muscle weakness (generalized): Secondary | ICD-10-CM | POA: Diagnosis not present

## 2017-03-11 DIAGNOSIS — N390 Urinary tract infection, site not specified: Secondary | ICD-10-CM | POA: Insufficient documentation

## 2017-03-11 DIAGNOSIS — I639 Cerebral infarction, unspecified: Secondary | ICD-10-CM | POA: Diagnosis not present

## 2017-03-11 LAB — BASIC METABOLIC PANEL
Anion gap: 11 (ref 5–15)
BUN: 24 mg/dL — AB (ref 6–20)
CALCIUM: 8.8 mg/dL — AB (ref 8.9–10.3)
CO2: 25 mmol/L (ref 22–32)
CREATININE: 0.86 mg/dL (ref 0.44–1.00)
Chloride: 104 mmol/L (ref 101–111)
GFR calc Af Amer: >60 mL/min (ref >60)
GFR, EST NON AFRICAN AMERICAN: 58 mL/min — AB (ref >60)
GLUCOSE: 91 mg/dL (ref 65–99)
POTASSIUM: 3.9 mmol/L (ref 3.5–5.1)
SODIUM: 140 mmol/L (ref 135–145)

## 2017-03-11 LAB — CBC WITH DIFFERENTIAL/PLATELET
Basophils Absolute: 0 10*3/uL (ref 0.0–0.1)
Basophils Relative: 0 %
EOS ABS: 0.1 10*3/uL (ref 0.0–0.7)
EOS PCT: 1 %
HCT: 44 % (ref 36.0–46.0)
Hemoglobin: 13.7 g/dL (ref 12.0–15.0)
LYMPHS ABS: 1.9 10*3/uL (ref 0.7–4.0)
Lymphocytes Relative: 19 %
MCH: 27.2 pg (ref 26.0–34.0)
MCHC: 31.1 g/dL (ref 30.0–36.0)
MCV: 87.5 fL (ref 78.0–100.0)
MONO ABS: 1.1 10*3/uL — AB (ref 0.1–1.0)
Monocytes Relative: 11 %
Neutro Abs: 6.6 10*3/uL (ref 1.7–7.7)
Neutrophils Relative %: 69 %
PLATELETS: 222 10*3/uL (ref 150–400)
RBC: 5.03 MIL/uL (ref 3.87–5.11)
RDW: 15.3 % (ref 11.5–15.5)
WBC: 9.7 10*3/uL (ref 4.0–10.5)

## 2017-03-13 LAB — URINE CULTURE: Culture: >=100000 — AB

## 2017-03-24 ENCOUNTER — Encounter (HOSPITAL_COMMUNITY)
Admission: RE | Admit: 2017-03-24 | Discharge: 2017-03-24 | Disposition: A | Discharge status: Home / Self Care | Payer: Medicare Other | Source: Skilled Nursing Facility | Attending: Internal Medicine | Admitting: Internal Medicine

## 2017-03-24 DIAGNOSIS — E0590 Thyrotoxicosis, unspecified without thyrotoxic crisis or storm: Secondary | ICD-10-CM | POA: Insufficient documentation

## 2017-03-24 LAB — T4, FREE: Free T4: 1.01 ng/dL (ref 0.61–1.12)

## 2017-03-24 LAB — TSH: TSH: 0.022 u[IU]/mL — ABNORMAL LOW (ref 0.350–4.500)

## 2017-03-25 LAB — T3, FREE: T3 FREE: 2.7 pg/mL (ref 2.0–4.4)

## 2017-03-29 ENCOUNTER — Encounter: Payer: Self-pay | Admitting: Internal Medicine

## 2017-03-29 ENCOUNTER — Non-Acute Institutional Stay (SKILLED_NURSING_FACILITY): Payer: Medicare Other | Admitting: Internal Medicine

## 2017-03-29 DIAGNOSIS — F0391 Unspecified dementia with behavioral disturbance: Secondary | ICD-10-CM

## 2017-03-29 DIAGNOSIS — I10 Essential (primary) hypertension: Secondary | ICD-10-CM | POA: Diagnosis not present

## 2017-03-29 DIAGNOSIS — I63312 Cerebral infarction due to thrombosis of left middle cerebral artery: Secondary | ICD-10-CM

## 2017-03-29 DIAGNOSIS — E118 Type 2 diabetes mellitus with unspecified complications: Secondary | ICD-10-CM

## 2017-03-29 DIAGNOSIS — E0590 Thyrotoxicosis, unspecified without thyrotoxic crisis or storm: Secondary | ICD-10-CM

## 2017-03-29 DIAGNOSIS — I482 Chronic atrial fibrillation, unspecified: Secondary | ICD-10-CM

## 2017-03-29 DIAGNOSIS — S72001A Fracture of unspecified part of neck of right femur, initial encounter for closed fracture: Secondary | ICD-10-CM

## 2017-03-29 DIAGNOSIS — F0392 Unspecified dementia, unspecified severity, with psychotic disturbance: Secondary | ICD-10-CM

## 2017-03-29 NOTE — Progress Notes (Signed)
Location:   [place] Room Number: 102/P Place of Service:  SNF (31) Provider:  Freddi Starr, MD  Patient Care Team: Virgie Dad, MD as PCP - General (Internal Medicine) Neldon Mc, MD (General Surgery) Jonnie Kind, MD as Consulting Physician (Obstetrics and Gynecology) Rolm Baptise as Physician Assistant (Internal Medicine)  Extended Emergency Contact Information Primary Emergency Contact: Toms,Denise          [place], [place] [address] Johnnette Litter of [place] Phone: [phone] Mobile Phone: [phone] Relation: Relative Secondary Emergency Contact: [place] Crane Address: [address]          [place], [place] [address] Johnnette Litter of [place] Phone: [phone] Relation: Son  Code Status:  DNR Goals of care: Advanced Directive information Advanced Directives 03/29/2017  Does Patient Have a Medical Advance Directive? Yes  Type of Advance Directive Out of facility DNR (pink MOST or yellow form)  Does patient want to make changes to medical advance directive? No - Patient declined  Copy of [place] in Chart? -  Would patient like information on creating a medical advance directive? -  Pre-existing out of facility DNR order (yellow form or pink MOST form) -     Chief Complaint  Patient presents with  . Medical Management of Chronic Issues    Routine Visit  For medical management of chronic medical conditions including dementia with behaviors-atrial fibrillation-hypertension-hyperthyroidism- history of CVA- history of recurrent UTIs- chronic kidney disease-  HPI:  Pt is a 84 y.o. female seen today for medical management of chronic diseases as noted above. Most acute issue recently has been continued dementia with behaviors-she is followed by psychiatric services and currently is on Depakote 3 times a day Seroquel in the afternoon and also at at bedtime as well as Xanax routinely in the  afternoon and as needed 3 other times during the day.  This continues at times to be an issue and has been monitored again by psychiatric services as well as Dr. Lyndel Safe-.  Clinically she appears to be stable in regards to her other medical issues she does have a history of atrial fibrillation appears to be rate controlled on Lopressor she is on Eliquis for anticoagulation.  She also has a history of hypertension which appears stable recent blood pressure is 128/87-115/72--- 109/68 she is on Cozaar and metoprolol.  She has had a recent diagnosis of hyperthyroidism she is followed by endocrinology recent labs earlier this month showed a normal T3-T4 TSH was low however at 0.022 again this is followed by endocrinology.--That she has been started on Tapazole  She also has a history of UTIs and completed antibiotic last month for 1-her white count has normalized.  She continues on trimethoprim for prophylaxis.  Regards to CVA she does have anticoagulation with Eliquis neurologically appears to be grossly intact she largely ambulates in a wheelchair again this is complicated with her severe dementia.  She also has a history of GI bleed in the past but hemoglobin has been stable now for some time it was 13.7 on lab done last month.  She also has a listed history of type 2 diabetes which appears to be diet-controlled hemoglobin A1c was 6.0 back in September will have this rechecked.  Currently she is lying in bed comfortably she is sleeping which is what she usually does this time in the afternoon but she is arousable and does respond to verbal commands.    Marland Kitchen  Past Medical History:  Diagnosis Date  . Arthritis   . Cancer ([place])    Rt Breast  . Coronary artery disease   . Cyst of breast    Right - at 24 yrs of age.  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 Diabetes mellitus without complication ([place])   . Diverticulitis   . Elevated cholesterol     . GERD (gastroesophageal reflux disease)   . Hardening of the arteries of the brain   . Heart disease   . Herniated disc   . Hx Breast cancer, DCIS, Right 08/19/2010  . Hypertension   . Migraine headache   . SBO (small bowel obstruction) ([place]) 09/02/11  . Shingles   . Stroke [facility]) 2004   cerebral hemmorhage   Past Surgical History:  Procedure Laterality Date  . BRAIN SURGERY     hemorrhage  . BREAST SURGERY  09/25/2010   mastectomy  . CYSTECTOMY    . ESOPHAGOGASTRODUODENOSCOPY N/A 11/13/2015   Procedure: ESOPHAGOGASTRODUODENOSCOPY (EGD);  Surgeon: Rogene Houston, MD;  [facility];  Service: Endoscopy;  Laterality: N/A;  . fibroid tumors    . KNEE ARTHROSCOPY Right    right knee x 3  . LAPAROSCOPIC BILATERAL SALPINGO OOPHERECTOMY    . SPINE SURGERY     repair herniated disc  . TOTAL HIP ARTHROPLASTY Right 11/26/2015   Procedure: HEMI HIP ARTHROPLASTY ANTERIOR APPROACH;  Surgeon: Rod Can, MD;  Location: [facility] ORS;  Service: Orthopedics;  Laterality: Right;    Allergies  Allergen Reactions  . Tape Rash  . Ciprofloxacin   . Sulfa Antibiotics Hives  . Codeine Nausea Only    nausea.  . Morphine And Related Nausea Only    Nausea only.    Outpatient Encounter Medications as of 03/29/2017  Medication Sig  . acetaminophen (TYLENOL) 325 MG tablet Take 650 mg by mouth every 6 (six) hours as needed.  . ALPRAZolam (XANAX) 0.5 MG tablet Take 0.5 mg by mouth daily.  Marland Kitchen ALPRAZolam (XANAX) 0.5 MG tablet Take 0.5 mg by mouth 3 (three) times daily as needed for anxiety.  Marland Kitchen apixaban (ELIQUIS) 2.5 MG TABS tablet Take 1 tablet (2.5 mg total) by mouth 2 (two) times daily.  Roseanne Kaufman [place]-Castor Oil (VENELEX) OINT Apply to sacrum and bilateral buttocks q shift and prn  . Dextromethorphan-Guaifenesin 10-100 MG/5ML liquid Take 15 mLs by mouth every 6 (six) hours as needed.  . divalproex (DEPAKOTE SPRINKLE) 125 MG capsule Take 250 mg by mouth 3 (three) times daily.   Marland Kitchen gabapentin  (NEURONTIN) 100 MG capsule Take 1 capsule (100 mg total) by mouth 2 (two) times daily.  Marland Kitchen ipratropium-albuterol (DUONEB) 0.5-2.5 (3) MG/3ML SOLN Take 3 mLs by nebulization 4 (four) times daily as needed.  Marland Kitchen losartan (COZAAR) 50 MG tablet Take 1 tablet (50 mg total) by mouth daily.  . methimazole (TAPAZOLE) 5 MG tablet Take 1 tablet (5 mg total) by mouth daily.  . metoprolol 75 MG TABS Take 75 mg by mouth 2 (two) times daily.  . pantoprazole (PROTONIX) 40 MG tablet Take 40 mg by mouth daily.  . QUEtiapine (SEROQUEL) 25 MG tablet Take 50 mg by mouth daily.   . QUEtiapine (SEROQUEL) 50 MG tablet Take 50 mg by mouth at bedtime.  . senna (SENOKOT) 8.6 MG TABS tablet Take 1 tablet by mouth at bedtime.  . sertraline (ZOLOFT) 50 MG tablet Take 50 mg by mouth daily.  Marland Kitchen  trimethoprim (TRIMPEX) 100 MG tablet Take 100 mg by mouth at bedtime.   No facility-administered encounter medications on file as of 03/29/2017.      Review of Systems   Unobtainable secondary to dementia please see HPI nursing does not report any recent issues except at times yelling episodes with her history of dementia  Immunization History  Administered Date(s) Administered  . Influenza-Unspecified 12/24/2016  . Pneumococcal Conjugate-13 12/24/2016  . Pneumococcal Polysaccharide-23 12/30/2015  . Tdap 12/24/2016   Pertinent  Health Maintenance Due  Topic Date Due  . FOOT EXAM  03/18/2017  . OPHTHALMOLOGY EXAM  04/10/2017 (Originally 03/22/1934)  . DEXA SCAN  04/10/2017 (Originally 03/23/1991)  . HEMOGLOBIN A1C  05/30/2017  . URINE MICROALBUMIN  12/21/2017  . INFLUENZA VACCINE  Completed  . PNA vac Low Risk Adult  Completed   Fall Risk  12/23/2016 12/20/2016  Falls in the past year? Yes Yes  Number falls in past yr: 2 or more 2 or more  Injury with Fall? Yes Yes  Risk Factor Category  High Fall Risk -   Functional Status Survey:    Vitals:   03/29/17 1409  BP: 109/68  Pulse: 77  Resp: 20  Temp: 98.6 F (37 C)    TempSrc: Oral  SpO2: 100%  Weight: 115 lb (52.2 kg)  Height: 5' (1.524 m)   Body mass index is 22.46 kg/m. Physical Exam   In general this is a somewhat frail elderly female no distress lying in bed she is sleeping but is arousable.  Her skin is warm and dry.  Eyes visual acuity appears grossly intact sclera and conjunctive are clear pupils appear to be reactive to light.  Chest is clear to auscultation there is no labored breathing she does have somewhat poor effort.  Heart is irregular irregular rate and rhythm without murmur gallop or rub she has fairly minimal lower extremity edema bilaterally.  Her abdomen is soft nontender with positive bowel sounds.  GU could not really appreciate suprapubic tenderness or distention.  Musculoskeletal is able to move all extremities x4 again limited exams bed she does largely ambulating in a wheelchair and usually has assistance.  Neurologic appears grossly intact could not really appreciate lateralizing findings she is sleeping but is easily arousable.  Psych she is oriented to self only will respond at times to simple verbal commands at times I will hear her talking to fellow residents and actually appears to be able to follow the string of conversation at times  Labs reviewed: Recent Labs    03/03/17 1040 03/09/17 2000 03/11/17 0724  NA 140 142 140  K 3.9 4.1 3.9  CL 104 105 104  CO2 29 26 25   GLUCOSE 114* 119* 91  BUN 30* 29* 24*  CREATININE 0.89 1.00 0.86  CALCIUM 8.9 9.1 8.8*   Recent Labs    11/30/16 0702 03/03/17 1040 03/09/17 2000  AST 21 25 30   ALT 21 19 24   ALKPHOS 115 154* 143*  BILITOT 0.7 0.4 0.5  PROT 5.7* 5.9* 6.4*  ALBUMIN 3.2* 3.3* 3.5   Recent Labs    08/19/16 0700  03/03/17 1040 03/09/17 2000 03/11/17 0724  WBC 7.6   < > 6.2 11.3* 9.7  NEUTROABS 4.8  --   --  8.4* 6.6  HGB 13.5   < > 14.5 14.6 13.7  HCT 43.0   < > 46.4* 46.0 44.0  MCV 82.9   < > 88.5 87.6 87.5  PLT 138*   < >  187 226 222    < > = values in this interval not displayed.   Lab Results  Component Value Date   TSH 0.022 (L) 03/24/2017   Lab Results  Component Value Date   HGBA1C 6.0 (H) 11/30/2016   Lab Results  Component Value Date   CHOL 118 09/29/2016   HDL 31 (L) 09/29/2016   LDLCALC 73 09/29/2016   TRIG 68 09/29/2016   CHOLHDL 3.8 09/29/2016    Significant Diagnostic Results in last 30 days:  No results found.  Assessment/Plan  #1-history of dementia with behaviors-this continues to be an issue she is followed by psych anxious services and will bear follow-up she continues on Depakote 3 times a day Seroquel twice a day and Xanax routinely once a day and as needed- will update a hemoglobin A1c secondary to high risk meds and await psychiatric input.  2.  Atrial fibrillation this appears rate controlled on Lopressor she is on Eliquis for anticoagulation Eliquis is low-dose with a history of GI bleed her hemoglobin again has shown stability.  3.  Hypertension this appears stable as noted above she is on Cozaar as well as Lopressor  #4 hyperthyroidism she is followed by endocrinology and continues on Tapazole this is a fairly recent diagnoses clinically she appears stable weight appears to be relatively stable at 115.  5.-History of recurrent UTIs she did recently complete antibiotic last month she did have an elevated white count at that time as well but that has normalized she does not appear to be symptomatic currently she is on prophylaxis.  6.  History of CVA she is on Eliquis I suspect there is an element of vascular dementia here with the CVA.  7.  History of chronic kidney disease this is been stable for some time with a creatinine of 0.86 BUN of 24 on lab done late last month.  8.  History of GI bleed again this is been fairly distant past hemoglobins have been stable will monitor periodically--she is on Protonix   #9- history of type 2 diabetes again hemoglobin A1c was 6 on lab in  September will update this especially since she is on some high risk meds here.  42.  Depression-she is on Zoloft again in addition to her psychiatric meds she is followed by psychiatric services.  #11 History of cough and congestion at times-she does haveduo nebs  as needed apparently this is stabilized she did have a chest x-ray done back in November which did not really show any acute process  #12 history of right hip fracture she appears to be asymptomatic she is largely ambulatory in a wheelchair pain does not appear to be an issue causing her anxiety   [id number]-note greater than 35 minutes spent assessing patient- reviewing her chart-reviewing her labs-discussing her status with nursing staff-and coordinating and formulating a plan of care for numerous diagnoses- of note greater than 50% of time spent coordinating a plan of care

## 2017-03-30 ENCOUNTER — Encounter (HOSPITAL_COMMUNITY)
Admission: RE | Admit: 2017-03-30 | Discharge: 2017-03-30 | Disposition: A | Discharge status: Home / Self Care | Payer: Medicare Other | Source: Skilled Nursing Facility | Attending: Internal Medicine | Admitting: Internal Medicine

## 2017-03-30 ENCOUNTER — Other Ambulatory Visit: Payer: Self-pay

## 2017-03-30 DIAGNOSIS — F28 Other psychotic disorder not due to a substance or known physiological condition: Secondary | ICD-10-CM | POA: Diagnosis not present

## 2017-03-30 DIAGNOSIS — F0391 Unspecified dementia with behavioral disturbance: Secondary | ICD-10-CM | POA: Insufficient documentation

## 2017-03-30 DIAGNOSIS — M6281 Muscle weakness (generalized): Secondary | ICD-10-CM | POA: Diagnosis not present

## 2017-03-30 DIAGNOSIS — Z885 Allergy status to narcotic agent status: Secondary | ICD-10-CM | POA: Insufficient documentation

## 2017-03-30 DIAGNOSIS — Z882 Allergy status to sulfonamides status: Secondary | ICD-10-CM | POA: Insufficient documentation

## 2017-03-30 DIAGNOSIS — E119 Type 2 diabetes mellitus without complications: Secondary | ICD-10-CM | POA: Insufficient documentation

## 2017-03-30 DIAGNOSIS — N390 Urinary tract infection, site not specified: Secondary | ICD-10-CM | POA: Diagnosis not present

## 2017-03-30 DIAGNOSIS — I639 Cerebral infarction, unspecified: Secondary | ICD-10-CM | POA: Insufficient documentation

## 2017-03-30 LAB — HEMOGLOBIN A1C
HEMOGLOBIN A1C: 5.8 % — AB (ref 4.8–5.6)
Mean Plasma Glucose: 119.76 mg/dL

## 2017-03-30 MED ORDER — ALPRAZOLAM 0.5 MG PO TABS: 0.5000 mg | ORAL_TABLET | Freq: Every day | ORAL | 0 refills | Status: AC

## 2017-03-30 MED ORDER — ALPRAZOLAM 0.5 MG PO TABS: 0.5000 mg | ORAL_TABLET | Freq: Three times a day (TID) | ORAL | 0 refills | Status: AC | PRN

## 2017-03-30 NOTE — Telephone Encounter (Signed)
RX Fax for [facility]@ [phone]  

## 2017-03-31 ENCOUNTER — Ambulatory Visit: Payer: Medicare Other | Admitting: "Endocrinology

## 2017-04-05 ENCOUNTER — Encounter (HOSPITAL_COMMUNITY)
Admission: RE | Admit: 2017-04-05 | Discharge: 2017-04-05 | Disposition: A | Discharge status: Home / Self Care | Payer: Medicare Other | Source: Skilled Nursing Facility | Attending: Internal Medicine | Admitting: Internal Medicine

## 2017-04-05 ENCOUNTER — Non-Acute Institutional Stay (SKILLED_NURSING_FACILITY): Payer: Medicare Other | Admitting: Internal Medicine

## 2017-04-05 ENCOUNTER — Encounter: Payer: Self-pay | Admitting: Internal Medicine

## 2017-04-05 DIAGNOSIS — I482 Chronic atrial fibrillation, unspecified: Secondary | ICD-10-CM

## 2017-04-05 DIAGNOSIS — N390 Urinary tract infection, site not specified: Secondary | ICD-10-CM | POA: Diagnosis present

## 2017-04-05 DIAGNOSIS — F0392 Unspecified dementia, unspecified severity, with psychotic disturbance: Secondary | ICD-10-CM

## 2017-04-05 DIAGNOSIS — F28 Other psychotic disorder not due to a substance or known physiological condition: Secondary | ICD-10-CM | POA: Insufficient documentation

## 2017-04-05 DIAGNOSIS — M6281 Muscle weakness (generalized): Secondary | ICD-10-CM | POA: Insufficient documentation

## 2017-04-05 DIAGNOSIS — I639 Cerebral infarction, unspecified: Secondary | ICD-10-CM | POA: Diagnosis present

## 2017-04-05 DIAGNOSIS — E0590 Thyrotoxicosis, unspecified without thyrotoxic crisis or storm: Secondary | ICD-10-CM | POA: Diagnosis not present

## 2017-04-05 DIAGNOSIS — F0391 Unspecified dementia with behavioral disturbance: Secondary | ICD-10-CM

## 2017-04-05 DIAGNOSIS — I10 Essential (primary) hypertension: Secondary | ICD-10-CM

## 2017-04-05 LAB — VALPROIC ACID LEVEL: VALPROIC ACID LVL: 42 ug/mL — AB (ref 50.0–100.0)

## 2017-04-05 NOTE — Progress Notes (Signed)
Location:   [facility] Room Number: 102/P Place of Service:  SNF (31) Provider:  Clydene Fake, MD  Patient Care Team: Virgie Dad, MD as PCP - General (Internal Medicine) Neldon Mc, MD (General Surgery) Jonnie Kind, MD as Consulting Physician (Obstetrics and Gynecology) Rolm Baptise as Physician Assistant (Internal Medicine)  Extended Emergency Contact Information Primary Emergency Contact: Toms,Denise          [place], [place] [address] Johnnette Litter of [place] Phone: [phone] Mobile Phone: [phone] Relation: Relative Secondary Emergency Contact: [place] Crane Address: [address]          [place], [place] [address] Johnnette Litter of [place] Phone: [phone] Relation: Son  Code Status:  DNR Goals of care: Advanced Directive information Advanced Directives 04/05/2017  Does Patient Have a Medical Advance Directive? Yes  Type of Advance Directive Out of facility DNR (pink MOST or yellow form)  Does patient want to make changes to medical advance directive? No - Patient declined  Copy of [place] in Chart? -  Would patient like information on creating a medical advance directive? -  Pre-existing out of facility DNR order (yellow form or pink MOST form) -     Chief Complaint  Patient presents with  . Acute Visit    Medication Adjustment for her behavior    HPI:  Pt is a 84 y.o. female seen today for an acute visit for her behavior.  Patient is a long-term resident of facility and has a history of CVA in 2016 , Chronic Atrial fibrillation, Hypertension, recurrent UTI , recurrent Falls , S/p Hip Fracture , GI bleed, CKD, recent Diagnosis of Hyperthyroidismand Dementia with behavior Issues.  Patient was seen again today per request of the nurses as she continues to have agitated behavior with yelling.  She was recently seen by psych nurse and her Depakote was increased to 250 mg 3 times  daily.  She also continues to be on Seroquel twice a day.  Xanax as needed and Zoloft. Patient continues to have nonstop yelling throughout the day sometimes at night.  She  she gets exhausted and then sleeps after that.  Medically her urine culture has been treated with antibiotics.  Chest x-ray had been negative. She was in her bed unable to give me any history.  She looked exhausted with continuous yelling.  She had a sitter with her. This is not has been any fever or chills.  Past Medical History:  Diagnosis Date  . Arthritis   . Cancer ([place])    Rt Breast  . Coronary artery disease   . Cyst of breast    Right - at 67 yrs of age.  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 Diabetes mellitus without complication ([place])   . Diverticulitis   . Elevated cholesterol   . GERD (gastroesophageal reflux disease)   . Hardening of the arteries of the brain   . Heart disease   . Herniated disc   . Hx Breast cancer, DCIS, Right 08/19/2010  . Hypertension   . Migraine headache   . SBO (small bowel obstruction) ([place]) 09/02/11  . Shingles   . Stroke [facility]) 2004   cerebral hemmorhage   Past Surgical History:  Procedure Laterality Date  . BRAIN SURGERY     hemorrhage  . BREAST SURGERY  09/25/2010   mastectomy  . CYSTECTOMY    .  ESOPHAGOGASTRODUODENOSCOPY N/A 11/13/2015   Procedure: ESOPHAGOGASTRODUODENOSCOPY (EGD);  Surgeon: Rogene Houston, MD;  [facility];  Service: Endoscopy;  Laterality: N/A;  . fibroid tumors    . KNEE ARTHROSCOPY Right    right knee x 3  . LAPAROSCOPIC BILATERAL SALPINGO OOPHERECTOMY    . SPINE SURGERY     repair herniated disc  . TOTAL HIP ARTHROPLASTY Right 11/26/2015   Procedure: HEMI HIP ARTHROPLASTY ANTERIOR APPROACH;  Surgeon: Rod Can, MD;  Location: [facility] ORS;  Service: Orthopedics;  Laterality: Right;    Allergies  Allergen Reactions  . Tape Rash  . Ciprofloxacin   . Sulfa Antibiotics Hives  . Codeine  Nausea Only    nausea.  . Morphine And Related Nausea Only    Nausea only.    Outpatient Encounter Medications as of 04/05/2017  Medication Sig  . acetaminophen (TYLENOL) 325 MG tablet Take 650 mg by mouth every 6 (six) hours as needed.  . ALPRAZolam (XANAX) 0.5 MG tablet Take 1 tablet (0.5 mg total) by mouth daily.  Marland Kitchen ALPRAZolam (XANAX) 0.5 MG tablet Take 1 tablet (0.5 mg total) by mouth 3 (three) times daily as needed for anxiety.  Marland Kitchen apixaban (ELIQUIS) 2.5 MG TABS tablet Take 1 tablet (2.5 mg total) by mouth 2 (two) times daily.  Roseanne Kaufman [place]-Castor Oil (VENELEX) OINT Apply to sacrum and bilateral buttocks q shift and prn  . Dextromethorphan-Guaifenesin 10-100 MG/5ML liquid Take 15 mLs by mouth every 6 (six) hours as needed.  . divalproex (DEPAKOTE SPRINKLE) 125 MG capsule Take 250 mg by mouth 3 (three) times daily.   Marland Kitchen gabapentin (NEURONTIN) 100 MG capsule Take 1 capsule (100 mg total) by mouth 2 (two) times daily.  Marland Kitchen ipratropium-albuterol (DUONEB) 0.5-2.5 (3) MG/3ML SOLN Take 3 mLs by nebulization 4 (four) times daily as needed.  Marland Kitchen losartan (COZAAR) 50 MG tablet Take 1 tablet (50 mg total) by mouth daily.  . methimazole (TAPAZOLE) 5 MG tablet Take 1 tablet (5 mg total) by mouth daily.  . metoprolol 75 MG TABS Take 75 mg by mouth 2 (two) times daily.  . pantoprazole (PROTONIX) 40 MG tablet Take 40 mg by mouth daily.  . QUEtiapine (SEROQUEL) 25 MG tablet Take 50 mg by mouth daily.   . QUEtiapine (SEROQUEL) 50 MG tablet Take 50 mg by mouth at bedtime.  . senna (SENOKOT) 8.6 MG TABS tablet Take 1 tablet by mouth at bedtime.  . sertraline (ZOLOFT) 50 MG tablet Take 50 mg by mouth daily.  Marland Kitchen trimethoprim (TRIMPEX) 100 MG tablet Take 100 mg by mouth at bedtime.   No facility-administered encounter medications on file as of 04/05/2017.      Review of Systems  Unable to perform ROS: Dementia    Immunization History  Administered Date(s) Administered  . Influenza-Unspecified 12/24/2016    . Pneumococcal Conjugate-13 12/24/2016  . Pneumococcal Polysaccharide-23 12/30/2015  . Tdap 12/24/2016   Pertinent  Health Maintenance Due  Topic Date Due  . OPHTHALMOLOGY EXAM  04/10/2017 (Originally 03/22/1934)  . DEXA SCAN  04/10/2017 (Originally 03/23/1991)  . HEMOGLOBIN A1C  09/27/2017  . URINE MICROALBUMIN  12/21/2017  . FOOT EXAM  01/31/2018  . INFLUENZA VACCINE  Completed  . PNA vac Low Risk Adult  Completed   Fall Risk  12/23/2016 12/20/2016  Falls in the past year? Yes Yes  Number falls in past yr: 2 or more 2 or more  Injury with Fall? Yes Yes  Risk Factor Category  High Fall Risk -  Functional Status Survey:    Vitals:   04/05/17 1430  BP: (!) 107/51  Pulse: 82  Resp: 18  Temp: 98.6 F (37 C)  TempSrc: Oral  SpO2: 96%   There is no height or weight on file to calculate BMI. Physical Exam  Constitutional: She appears well-developed and well-nourished.  HENT:  Head: Normocephalic.  Mouth/Throat: Oropharynx is clear and moist.  Eyes: Pupils are equal, round, and reactive to light.  Neck: Neck supple.  Cardiovascular: Normal rate. An irregular rhythm present.  Pulmonary/Chest: Effort normal and breath sounds normal. No respiratory distress. She has no wheezes. She has no rales.  Abdominal: Soft. Bowel sounds are normal. She exhibits no distension. There is no tenderness. There is no rebound.  Musculoskeletal: She exhibits no edema.  Lymphadenopathy:    She has no cervical adenopathy.  Neurological: She is alert.  Was not following any commands today  Skin: Skin is warm and dry.    Labs reviewed: Recent Labs    03/03/17 1040 03/09/17 2000 03/11/17 0724  NA 140 142 140  K 3.9 4.1 3.9  CL 104 105 104  CO2 29 26 25   GLUCOSE 114* 119* 91  BUN 30* 29* 24*  CREATININE 0.89 1.00 0.86  CALCIUM 8.9 9.1 8.8*   Recent Labs    11/30/16 0702 03/03/17 1040 03/09/17 2000  AST 21 25 30   ALT 21 19 24   ALKPHOS 115 154* 143*  BILITOT 0.7 0.4 0.5  PROT 5.7*  5.9* 6.4*  ALBUMIN 3.2* 3.3* 3.5   Recent Labs    08/19/16 0700  03/03/17 1040 03/09/17 2000 03/11/17 0724  WBC 7.6   < > 6.2 11.3* 9.7  NEUTROABS 4.8  --   --  8.4* 6.6  HGB 13.5   < > 14.5 14.6 13.7  HCT 43.0   < > 46.4* 46.0 44.0  MCV 82.9   < > 88.5 87.6 87.5  PLT 138*   < > 187 226 222   < > = values in this interval not displayed.   Lab Results  Component Value Date   TSH 0.022 (L) 03/24/2017   Lab Results  Component Value Date   HGBA1C 5.8 (H) 03/30/2017   Lab Results  Component Value Date   CHOL 118 09/29/2016   HDL 31 (L) 09/29/2016   LDLCALC 73 09/29/2016   TRIG 68 09/29/2016   CHOLHDL 3.8 09/29/2016    Significant Diagnostic Results in last 30 days:  No results found.  Assessment/Plan  Dementia with behavior issues ?  Bipolar disorder Have discussed her situation with the niece who has power of attorney Patient has worsened over the past few month.  Needs has signed most form and has said she was not interested in any aggressive treatment for her aunt Psych note was reviewed Will increase her Depakote to 375 in the morning and afternoon and continue to 250 at night We will continue Xanax as needed Continue Seroquel and Zoloft We will also start her on Namenda as suggested by psych but will start her slow with 5 mg twice daily and go up to 10 mg twice daily reassess in 2 weeks Her Depakote level done was normal Chronic A. fib She is on low-dose of Eliquis and her rate is controlled on metoprolol Hypertension Blood pressure is controlled on losartan and Metoprolol  Status post CVA Thought to be due to A Fib She has been stable on Eliquis.   Hypothyroidism On methimazole.  Follows with endocrinology History of  Recurrent UTI On trimethoprim Family/ staff Communication:   Labs/tests ordered:

## 2017-04-22 ENCOUNTER — Encounter (HOSPITAL_COMMUNITY)
Admission: RE | Admit: 2017-04-22 | Discharge: 2017-04-22 | Disposition: A | Discharge status: Home / Self Care | Payer: Medicare Other | Source: Skilled Nursing Facility | Attending: Internal Medicine | Admitting: Internal Medicine

## 2017-04-22 DIAGNOSIS — Z471 Aftercare following joint replacement surgery: Secondary | ICD-10-CM | POA: Insufficient documentation

## 2017-04-22 DIAGNOSIS — B9620 Unspecified Escherichia coli [E. coli] as the cause of diseases classified elsewhere: Secondary | ICD-10-CM | POA: Insufficient documentation

## 2017-04-22 DIAGNOSIS — I639 Cerebral infarction, unspecified: Secondary | ICD-10-CM | POA: Insufficient documentation

## 2017-04-22 DIAGNOSIS — N390 Urinary tract infection, site not specified: Secondary | ICD-10-CM | POA: Insufficient documentation

## 2017-04-22 LAB — VALPROIC ACID LEVEL: VALPROIC ACID LVL: 43 ug/mL — AB (ref 50.0–100.0)

## 2017-04-25 ENCOUNTER — Ambulatory Visit: Payer: Medicare Other | Admitting: "Endocrinology

## 2017-04-27 ENCOUNTER — Encounter: Payer: Self-pay | Admitting: Internal Medicine

## 2017-04-27 ENCOUNTER — Non-Acute Institutional Stay (SKILLED_NURSING_FACILITY): Payer: Medicare Other | Admitting: Internal Medicine

## 2017-04-27 DIAGNOSIS — F0392 Unspecified dementia, unspecified severity, with psychotic disturbance: Secondary | ICD-10-CM

## 2017-04-27 DIAGNOSIS — F0391 Unspecified dementia with behavioral disturbance: Secondary | ICD-10-CM

## 2017-04-27 DIAGNOSIS — R5383 Other fatigue: Secondary | ICD-10-CM | POA: Diagnosis not present

## 2017-04-27 NOTE — Progress Notes (Signed)
This is an acute visit.  Level of care skilled.  Facility is [facility].  Chief complaint-acute visit secondary to change in status.  History of present illness.  Patient is a 84 year old female who is a long-term resident of facility with a history of CVA in 2016 as well as atrial fibrillation hypertension history of UTIs and recurrent falls as well as a history of hip fracture GI bleed chronic kidney disease and hypothyroidism along with dementia with behavioral issues.  She does have a diagnosis of end-stage dementia and IMOST form has been filled out by her responsible party her knees with request for no aggressive interventions no IV fluids no feeding tube essentially comfort measures.  Most recent acute issues have been more dementia with behaviors related she has been seen by psychiatric services as well --she is currently on Depakote 375 mg in the morning and also at 4 PM and 250 mg at night.  She is also on Seroquel-50 mg at 1 PM and also at at bedtime.  She continues on Xanax at 2 PM as well as as needed doses.  She is also on Zoloft.  Tonight apparently she has had some increased lethargy still has some yelling but this is intermittent and not her baseline.  Vital signs appear to be stable but nursing staff feels she is just not herself.  Regards to her other issues she does have a history of atrial fibrillation is on low-dose Eliquis and metoprolol. She also is on losartan in addition of metoprolol for hypertension.  She also does have a history of CVA and again is on Eliquis CVA was thought secondary to the A. Fib  She also has a recent history of newly diagnosed  hyperthyroidism she is followed by endocrinology.   Past Medical History:  Diagnosis Date  . Arthritis   . Cancer ([place])    Rt Breast  . Coronary artery disease   . Cyst of breast    Right - at 72 yrs of age.  Marland Kitchen DCIS (ductal carcinoma in situ) of breast 2012   High grade DCIS; Rt breast    . Diabetes mellitus without complication ([place])   . Diverticulitis   . Elevated cholesterol   . GERD (gastroesophageal reflux disease)   . Hardening of the arteries of the brain   . Heart disease   . Herniated disc   . Hx Breast cancer, DCIS, Right 08/19/2010  . Hypertension   . Migraine headache   . SBO (small bowel obstruction) ([place]) 09/02/11  . Shingles   . Stroke [facility]) 2004   cerebral hemmorhage        Past Surgical History:  Procedure Laterality Date  . BRAIN SURGERY     hemorrhage  . BREAST SURGERY  09/25/2010   mastectomy  . CYSTECTOMY    . ESOPHAGOGASTRODUODENOSCOPY N/A 11/13/2015   Procedure: ESOPHAGOGASTRODUODENOSCOPY (EGD);  Surgeon: Rogene Houston, MD;  [facility];  Service: Endoscopy;  Laterality: N/A;  . fibroid tumors    . KNEE ARTHROSCOPY Right    right knee x 3  . LAPAROSCOPIC BILATERAL SALPINGO OOPHERECTOMY    . SPINE SURGERY     repair herniated disc  . TOTAL HIP ARTHROPLASTY Right 11/26/2015   Procedure: HEMI HIP ARTHROPLASTY ANTERIOR APPROACH;  Surgeon: Rod Can, MD;  Location: [facility];  Service: Orthopedics;  Laterality: Right;         Allergies  Allergen Reactions  . Tape Rash  . Ciprofloxacin   . Sulfa  Antibiotics Hives  . Codeine Nausea Only    nausea.  . Morphine And Related Nausea Only    Nausea only.     Medications.  Depakote 375 mg at 8 AM and 4 PM and 250 mg nightly.  Duo nebs 4 times daily as needed.  Eliquis 2.5 mg twice daily.  Neurontin 100 mg twice daily.  Losartan 50 mg every morning.  Methimazole 5 mg every morning.  Lopressor 75 mg twice daily.  Protonix 40 mg daily.  Robitussin every 6 hours 15 mL as needed.  Senna 8.6 milligrams nightly.  Seroquel 50 mg at at bedtime and also at 1 PM.  Trimethoprim 100 mg nightly.  Tylenol 650 mg as needed every 6 hours.  Xanax 0.5 mg routinely at 2 PM and 3 times a day as needed.  Zoloft 50 mg every  morning   Review of systems.  Unobtainable secondary to dementia please see HPI.  Physical exam. Temperature is 96.8 pulse 77 respirations 18 blood pressure 117/64 O2 saturation is 96% on room air CBG 85   In general this is a frail-appearing elderly female lying in bed she does not appear to be in distress but is somewhat lethargic but at times will yell   Her skin is warm and dry.  Eyes pupils appear to be reactive to light sclera were clear visual acuity is difficult to fully assess secondary to dementia but appears to be intact.  Oropharynx appears clear mucous membranes moist.  Chest poor respiratory effort since she does not follow verbal commands but could not really appreciate overt congestion there is no labored breathing.  Heart is largely regular rate and rhythm with occasional irregular beats.  Abdomen is soft does not appear to be tender there are positive bowel sounds.  Musculoskeletal difficult to fully assess since patient does not follow verbal commands but appears able to move her upper extremities at baseline with adequate muscle tone.  Neurologic as noted above somewhat difficult assessment nerves appear to be grossly intact and strength appears to be intact although since she does not follow verbal commands this is difficult to fully assess.  Psych she does have severe dementia does not respond to verbal commands will yell at times but generally has been lethargic.  Labs. April 22, 2017 valproic acid level was 43.  March 30, 2017 hemoglobin A1c was 5.8.  March 24, 2017-T3 was 2.7--T4 was 1.01. TSH was 0.022.  March 11, 2017.  Sodium 140 potassium 3.9 BUN 24 creatinine 0.86.  WBC 9.7 hemoglobin 13.7 platelets 222.   March 09, 2017.  Liver function tests within normal limits except alkaline phosphatase minimally elevated at 143.  Assessment and plan.  1.-  Lethargy- I did discuss this with her responsible party her niece--she did  confirm wishes for essentially comfort care I did discuss options including obtaining a chest x-ray urine culture and blood work-but per discussion with her niece-she essentially wants her Comfortable and in the facility-she does not desire an IV or labs for aggressive measures at this time-.  We will hold her Seroquel tonight as well as her Depakote-also will hold her Neurontin since this could be sedating as well.  Also monitor vital signs with pulse ox every 4 hours for now.  Addendum-I did reevaluate patient shortly thereafter and actually she was somewhat more alert she was yelling more which is her baseline.--  r staff was actually able to feed her some and she appeared to be tolerating this okay- they will  be giving her some honey thick orange juice secondary to a CBG of 85 again this could have been a transitory event-question TIA- at this point will continue to monitor but she actually appears to be more at her baseline later this evening.--Also will h check CBGs every 4 hours to make sure this remains stable  [id number]-of note greater than 40 minutes spent assessing patient-re-assessing patient discussing her status with her niece via phone as well as at bedside later in the evening- and coordinating and formulating a plan of care--greater than 50% of time spent coordinating plan of care with input as noted above

## 2017-05-04 ENCOUNTER — Encounter: Payer: Self-pay | Admitting: Internal Medicine

## 2017-05-04 ENCOUNTER — Non-Acute Institutional Stay (SKILLED_NURSING_FACILITY): Payer: Medicare Other | Admitting: Internal Medicine

## 2017-05-04 DIAGNOSIS — I482 Chronic atrial fibrillation, unspecified: Secondary | ICD-10-CM

## 2017-05-04 DIAGNOSIS — F0391 Unspecified dementia with behavioral disturbance: Secondary | ICD-10-CM | POA: Diagnosis not present

## 2017-05-04 DIAGNOSIS — H00016 Hordeolum externum left eye, unspecified eyelid: Secondary | ICD-10-CM

## 2017-05-04 DIAGNOSIS — F0392 Unspecified dementia, unspecified severity, with psychotic disturbance: Secondary | ICD-10-CM

## 2017-05-04 IMAGING — DX DG HIP (WITH OR WITHOUT PELVIS) 2-3V*R*
3 series · 3 of 3 positions shown · non-contrast
Comparison: None.

CLINICAL DATA: Status post fall today with right hip pain

EXAM:
DG HIP (WITH OR WITHOUT PELVIS) 2-3V RIGHT

[pelvis ap]
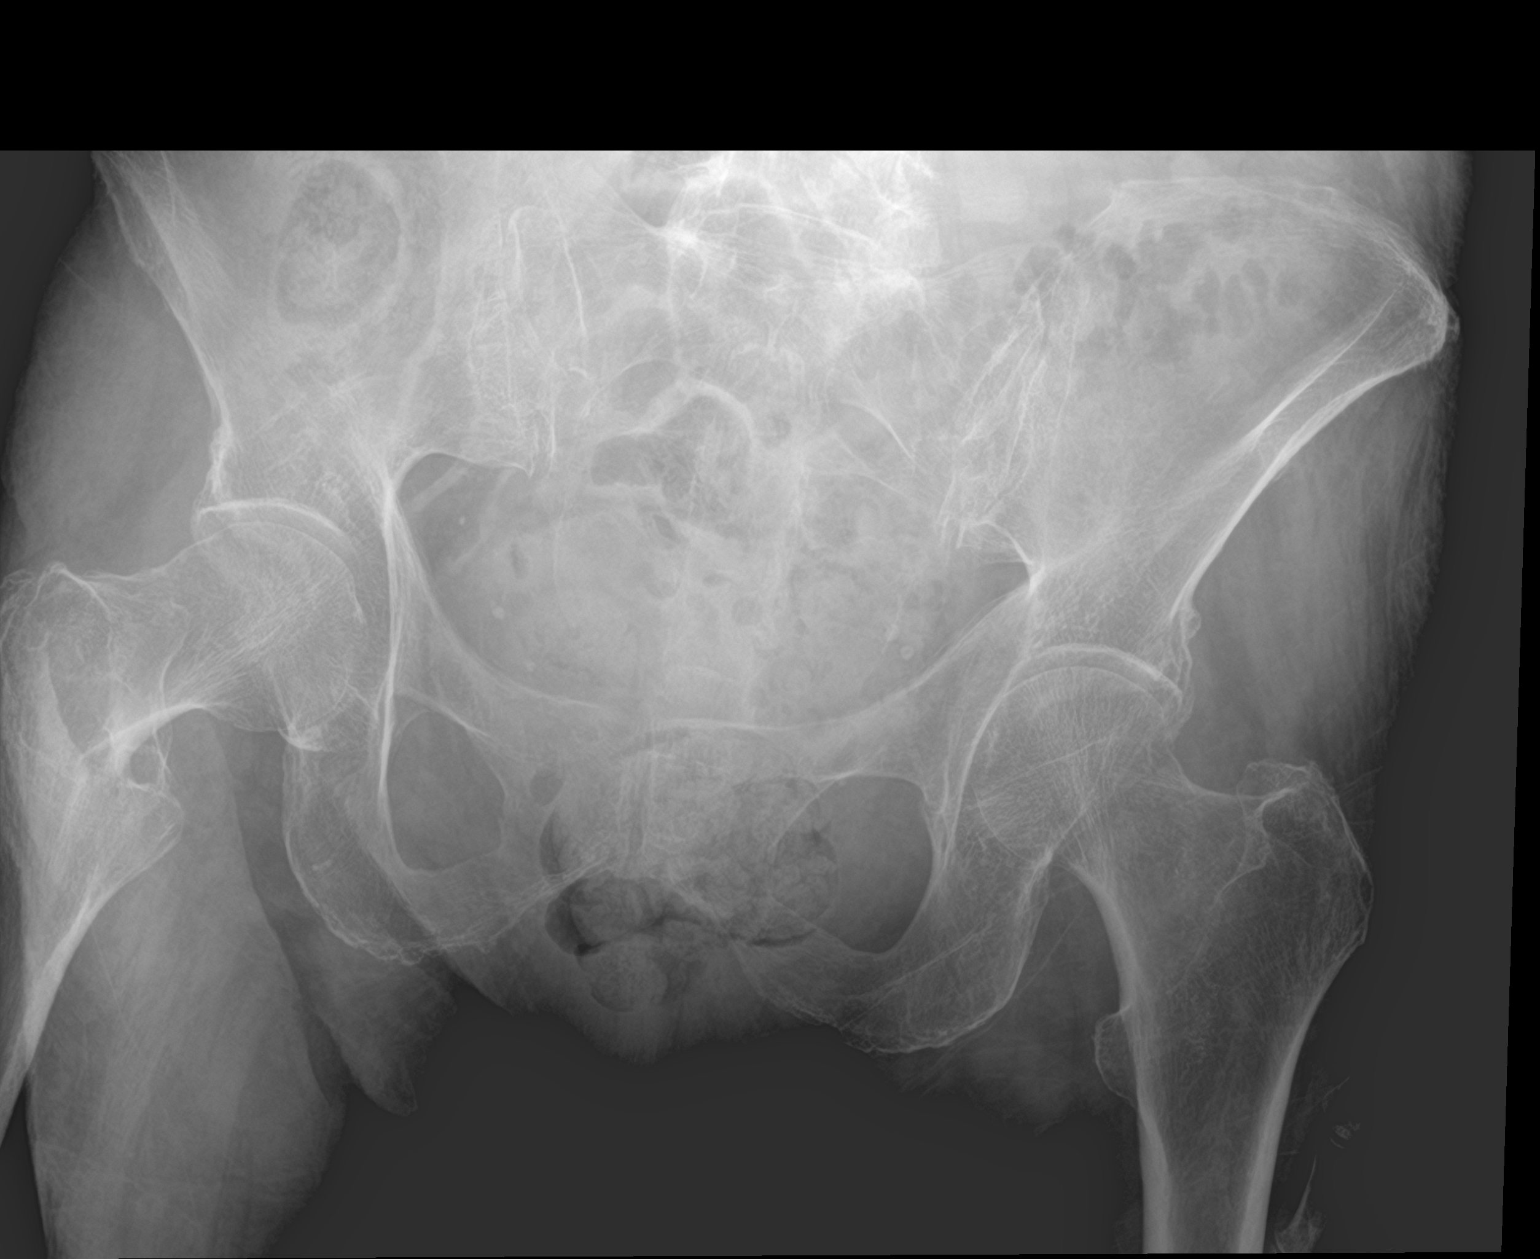

[hip ap]
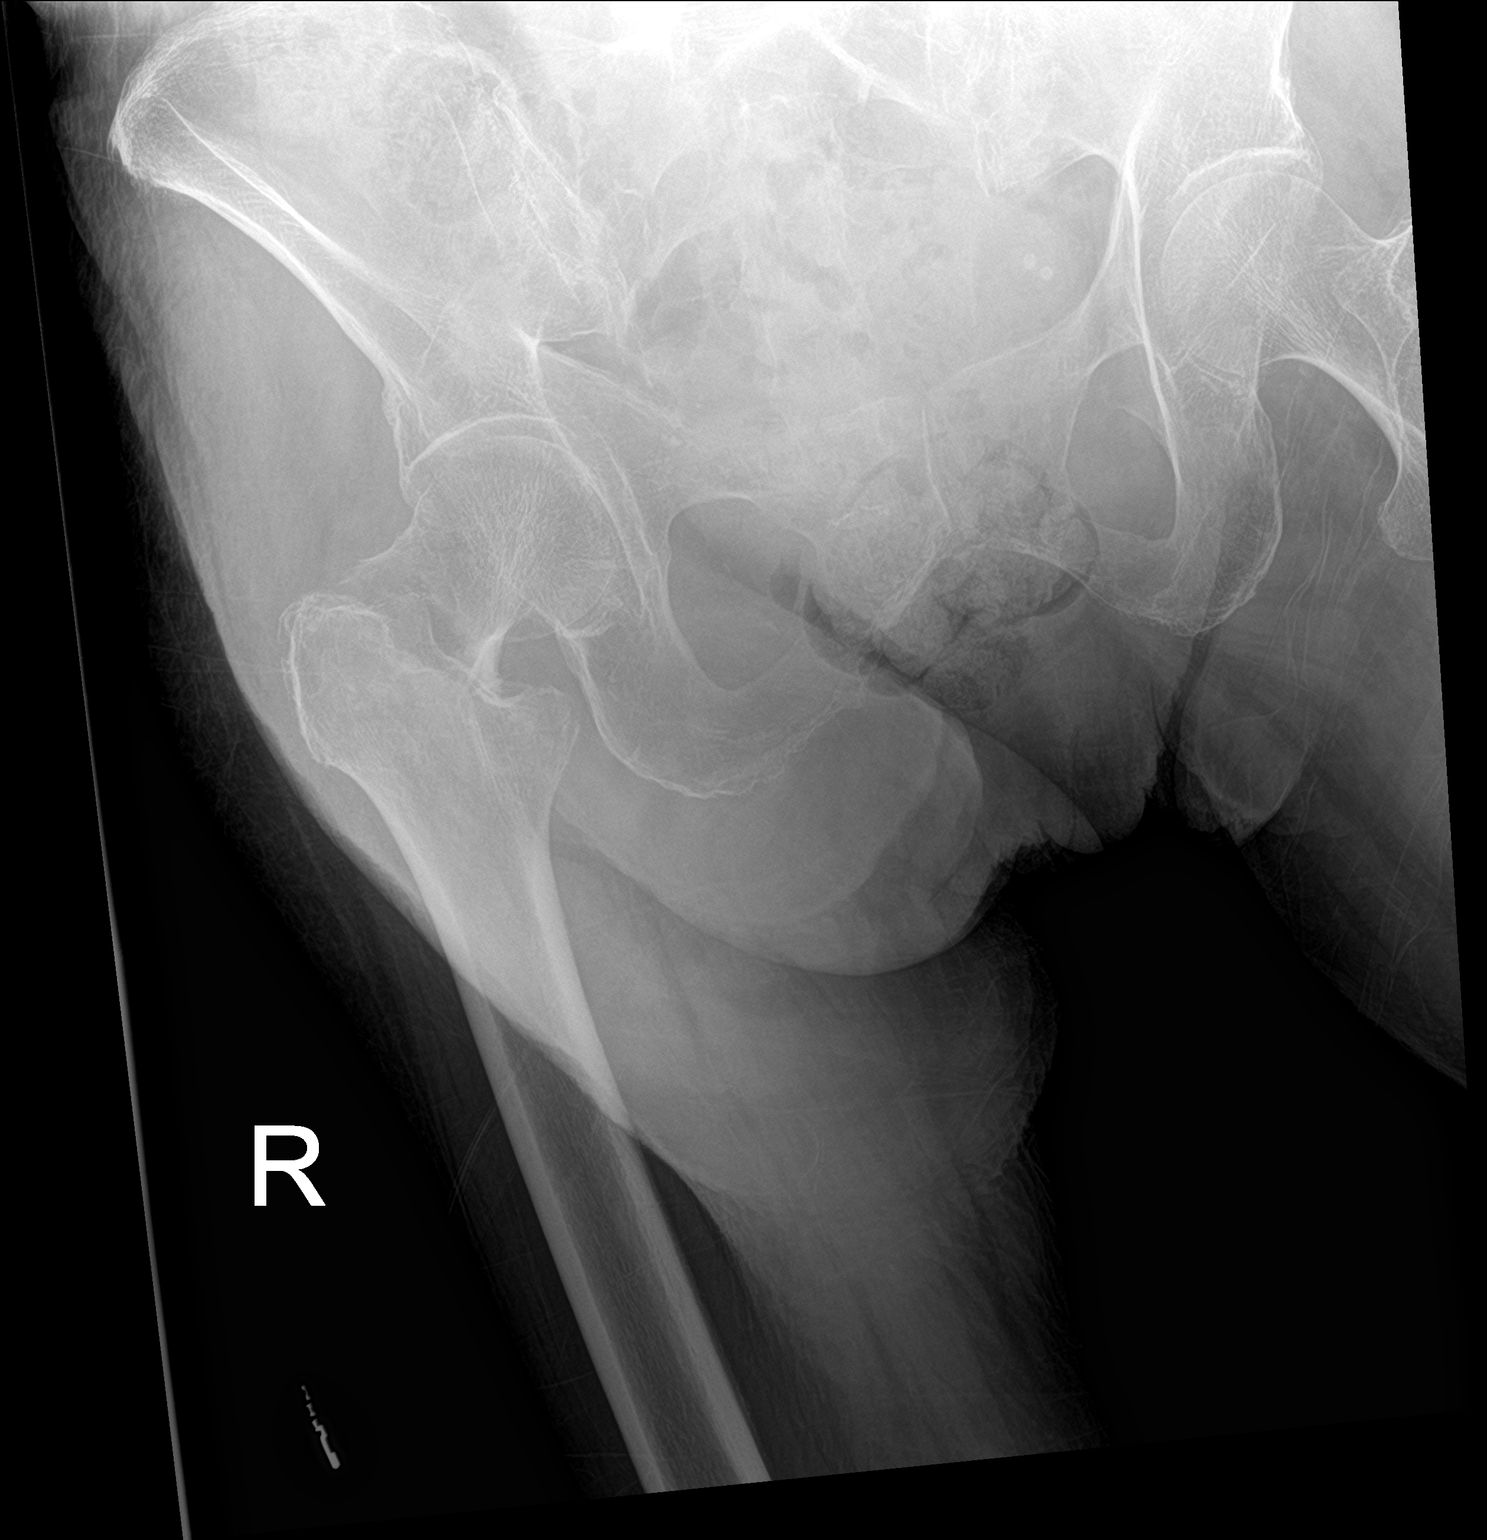

[hip lat]
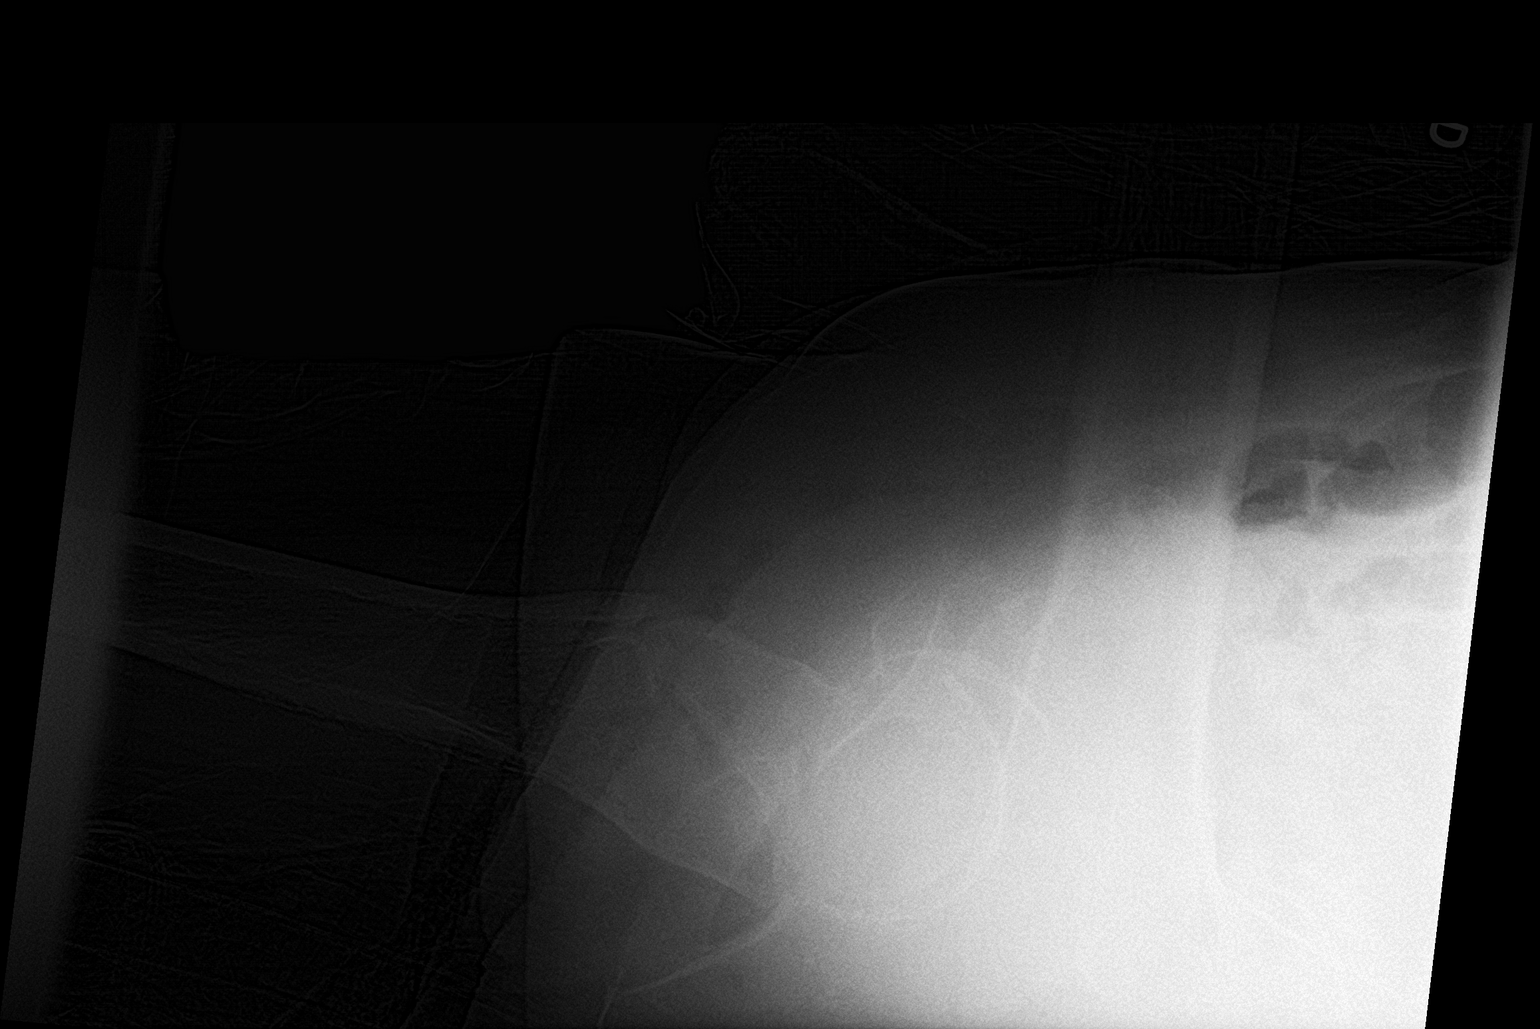

[3 of 3 positions shown; findings below may reference images not displayed]

FINDINGS: There is displaced fracture at the base of the right femoral neck.
There is no dislocation.
IMPRESSION: Fracture at the base of the right femoral neck.

## 2017-05-04 IMAGING — CT CT CERVICAL SPINE W/O CM
3 of 11 series · 9 of 33 positions shown, 10 images · non-contrast
Comparison: MR brain 03/18/2015, head CT 03/16/2015

CLINICAL DATA: 89-year-old female with a history of fall. Right hip
pain

EXAM:
CT HEAD WITHOUT CONTRAST
CT CERVICAL SPINE WITHOUT CONTRAST
TECHNIQUE: Multidetector CT imaging of the head and cervical spine was
performed following the standard protocol without intravenous
contrast. Multiplanar CT image reconstructions of the cervical spine
were also generated.

[Series 4: coronal · coronal · 0.30mm/px · 3 of 67 slices shown]
[im 17/67  bone]
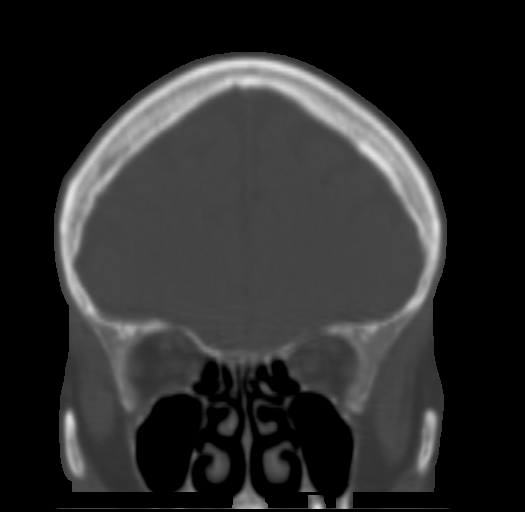
[im 34/67  bone]
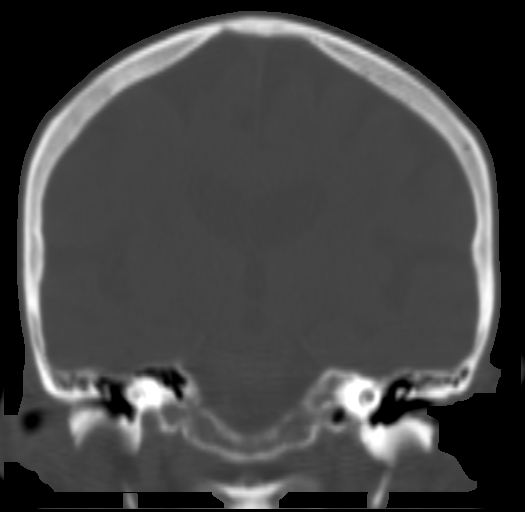
[im 50/67  bone]
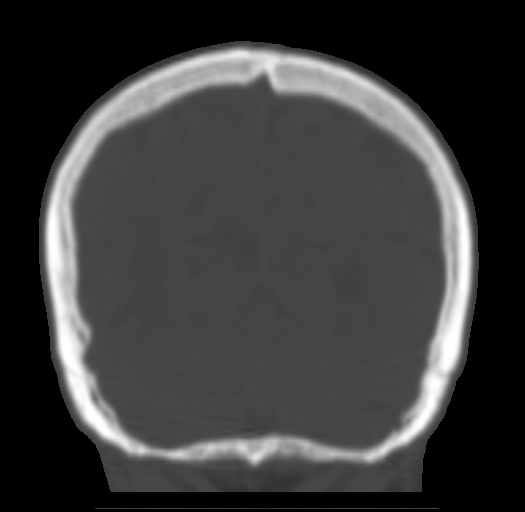

[Series 11: sagittal bone · sagittal · 0.22mm/px · 3 of 60 slices shown]
[im 15/60  bone]
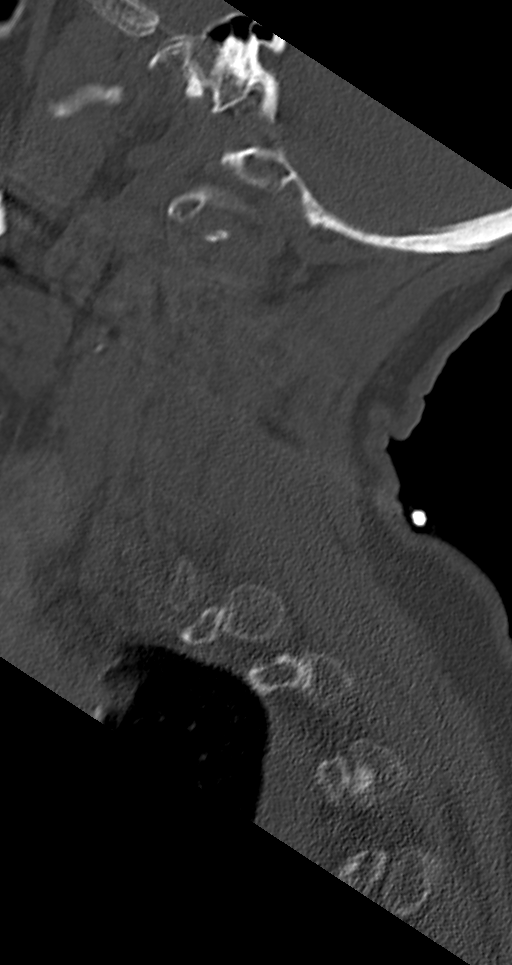
[im 30/60  bone]
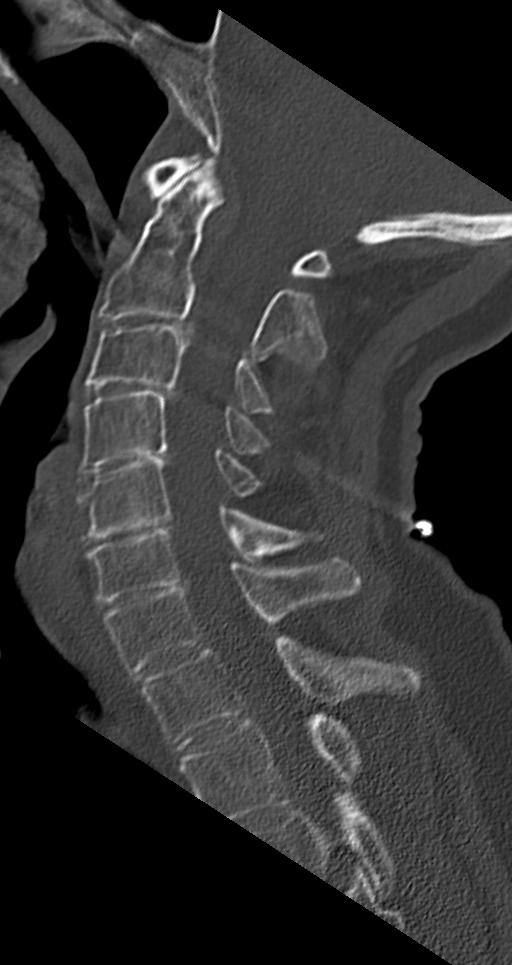
[im 45/60  bone]
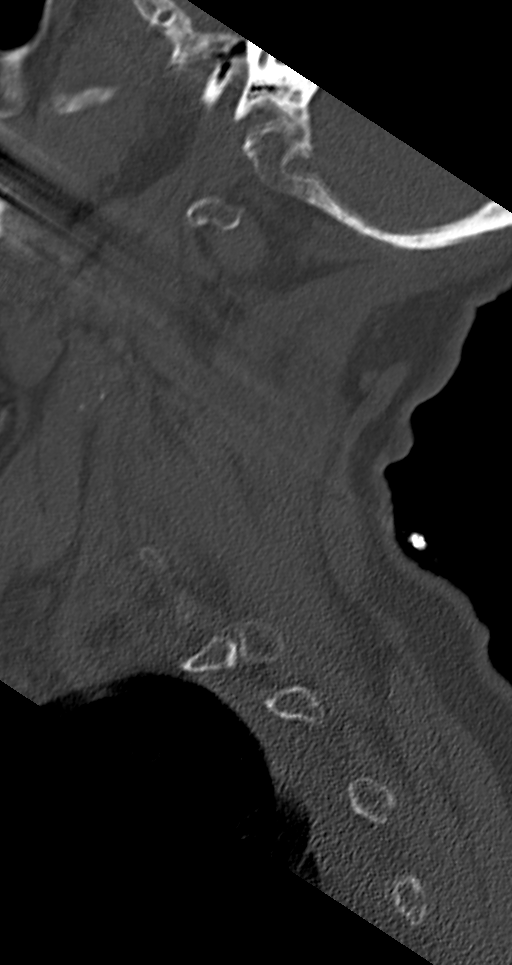

[Series 14: orthogonal axial · axial · 0.23mm/px · z∈[+126,+288]mm · 3 of 95 slices shown, 4 images]
[im 1/95  soft-tissue]
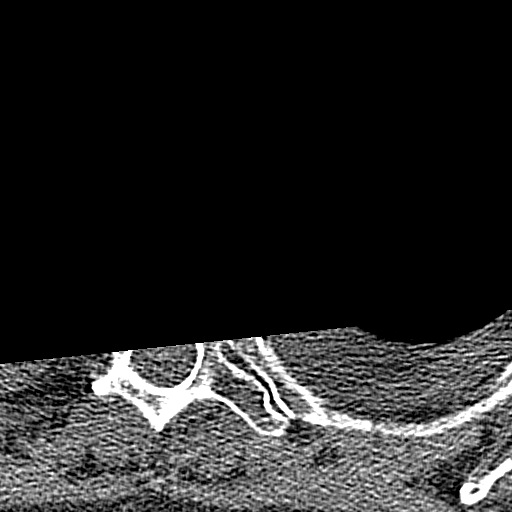
[im 1/95  bone]
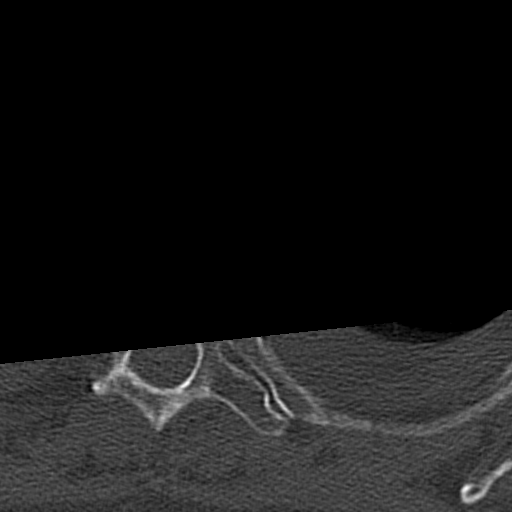
[im 48/95  bone]
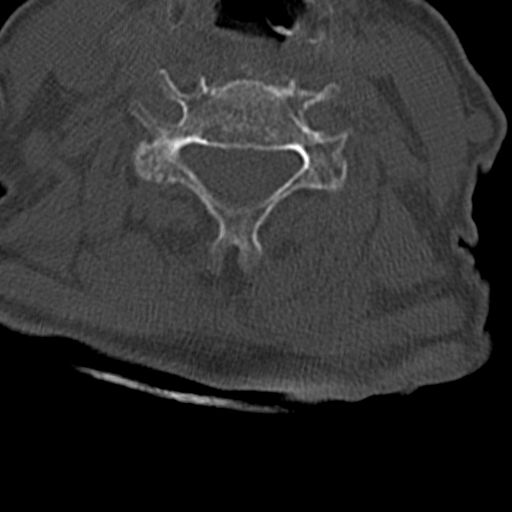
[im 95/95  bone]
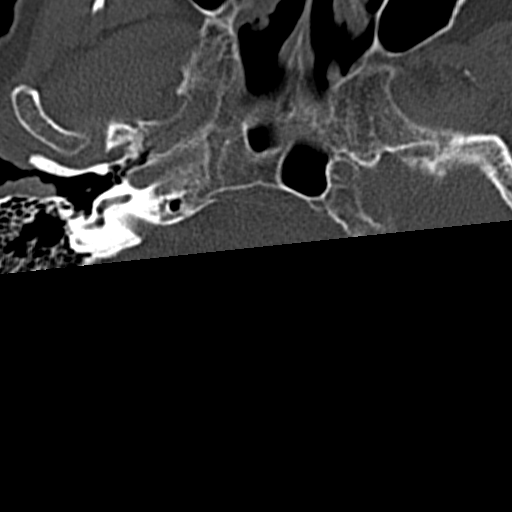

[9 of 33 positions shown; findings below may reference images not displayed]

FINDINGS: CT HEAD FINDINGS

Unremarkable appearance of the calvarium without acute fracture or
aggressive lesion.

Unremarkable appearance of the scalp soft tissues.

Unremarkable appearance of the bilateral orbits.

Mastoid air cells are clear.

No significant paranasal sinus disease

Small calcified meningioma along the inner table of the left
temporal bone, unchanged. No local mass effect.

No acute intracranial hemorrhage, midline shift, or mass effect.

Unchanged configuration of the ventricles.

Gray-white differentiation maintained. Confluent hypodensity in the
periventricular white matter, most pronounced in the posterior right
parietal and occipital lobe.

Hyperdense cortex of the right occipital region unchanged from the
comparison. No local mass effect.

Dense intracranial atherosclerosis.

CT CERVICAL SPINE FINDINGS

Craniocervical junction aligned. No acute fracture at the skullbase
identified.

Anatomic alignment of the cervical elements is relatively
maintained. No subluxation.

Vertebral body heights maintained.

No fracture line identified.

Alignment of the facets maintain.

Multilevel degenerative changes throughout the cervical spine with
disc space narrowing, uncovertebral joint disease, and posterior
disc osteophyte complexes.

No significant bony canal narrowing.

Uncovertebral joint disease and facet disease contributes to varying
degrees of bilateral neural foraminal narrowing.

No evidence of epidural hemorrhage.

Right thyroid cyst. Irregular parenchyma of the bilateral thyroid.
Calcifications of the carotid vasculature.

Bilateral pleural effusions layer dependently at the lung apices.
IMPRESSION: Head CT:

No CT evidence of acute intracranial abnormality.

Similar changes of chronic white matter disease and mineralization
of cortex in the right parieto-occipital region.

Intracranial atherosclerosis.

Cervical spine CT:

No CT evidence of acute fracture or malalignment of the cervical
spine.

Multilevel degenerative disc disease, uncovertebral joint disease,
as well as facet changes contribute to varying degrees of bilateral
neural foraminal narrowing of the cervical spine.

Bilateral pleural effusions layered at the lung apices.

Nodular and cystic changes of the thyroid. If clinically warranted,
further evaluation with ultrasound may be considered.

## 2017-05-04 NOTE — Progress Notes (Signed)
Location:   [facility] Room Number: 102/P Place of Service:  SNF [phone]) Provider:  Freddi Starr, MD  Patient Care Team: Virgie Dad, MD as PCP - General (Internal Medicine) Neldon Mc, MD (General Surgery) Jonnie Kind, MD as Consulting Physician (Obstetrics and Gynecology) Wille Celeste, PA-C as Physician Assistant (Internal Medicine)  Extended Emergency Contact Information Primary Emergency Contact: Alferd Patee, [place] [address] Johnnette Litter of [place] Phone: [phone] Mobile Phone: [phone] Relation: Relative Secondary Emergency Contact: Nevada Crane Address: [address]          [place], [place] [address] Johnnette Litter of [place] Phone: [phone] Relation: Son  Code Status:  DNR Goals of care: Advanced Directive information Advanced Directives 05/04/2017  Does Patient Have a Medical Advance Directive? Yes  Type of Advance Directive Out of facility DNR (pink MOST or yellow form)  Does patient want to make changes to medical advance directive? No - Patient declined  Copy of [place] in Chart? -  Would patient like information on creating a medical advance directive? -  Pre-existing out of facility DNR order (yellow form or pink MOST form) -     Chief complaint-acute visit secondary to suspected stye in left eye-also follow-up lethargy  HPI:  Pt is a 84 y.o. female seen today for an acute visit for an apparent stye in her left eye.  Also follow-up of this review.  Patient has a history of advanced dementia as well as history of CVA atrial fibrillation hypertension history of 1 as well as a history of fracture chronic kidney disease GI bleed and hypothyroidism.  Apparently today nursing staff and her sitter there is no appear to be some increased erythema in her medial left eye margin.  Appears to be a stye.  I also saw her earlier this month for lethargy.  I did discuss  this with her responsible party did not really want aggressive treatment essentially comfort care- we did hold her medications including Depakote and her Seroquel that evening she quickly returned to her baseline actually later that evening  And these medicines have been 3 instituted she has tolerated these well  Yelling at times continues to be an issue but apparently has somewhat improved on current Depakote dosing 3 times a day as well as Seroquel twice a day.  She also has Xanax routinely in the afternoon and 3 times a day as needed  She continues to be followed by psychiatric services as well     Past Medical History:  Diagnosis Date  . Arthritis   . Cancer ([place])    Rt Breast  . Coronary artery disease   . Cyst of breast    Right - at 67 yrs of age.  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 Diabetes mellitus without complication ([place])   . Diverticulitis   . Elevated cholesterol   . GERD (gastroesophageal reflux disease)   . Hardening of the arteries of the brain   . Heart disease   . Herniated disc   . Hx Breast cancer, DCIS, Right 08/19/2010  . Hypertension   . Migraine headache   . SBO (small bowel obstruction) ([place]) 09/02/11  . Shingles   . Stroke [facility]) 2004   cerebral hemmorhage   Past Surgical History:  Procedure Laterality Date  . BRAIN SURGERY  hemorrhage  . BREAST SURGERY  09/25/2010   mastectomy  . CYSTECTOMY    . ESOPHAGOGASTRODUODENOSCOPY N/A 11/13/2015   Procedure: ESOPHAGOGASTRODUODENOSCOPY (EGD);  Surgeon: Rogene Houston, MD;  [facility];  Service: Endoscopy;  Laterality: N/A;  . fibroid tumors    . KNEE ARTHROSCOPY Right    right knee x 3  . LAPAROSCOPIC BILATERAL SALPINGO OOPHERECTOMY    . SPINE SURGERY     repair herniated disc  . TOTAL HIP ARTHROPLASTY Right 11/26/2015   Procedure: HEMI HIP ARTHROPLASTY ANTERIOR APPROACH;  Surgeon: Rod Can, MD;  Location: [facility];  Service:  Orthopedics;  Laterality: Right;    Allergies  Allergen Reactions  . Tape Rash  . Ciprofloxacin   . Sulfa Antibiotics Hives  . Codeine Nausea Only    nausea.  . Morphine And Related Nausea Only    Nausea only.    Outpatient Encounter Medications as of 05/04/2017  Medication Sig  . acetaminophen (TYLENOL) 325 MG tablet Take 650 mg by mouth every 6 (six) hours as needed.  . ALPRAZolam (XANAX) 0.5 MG tablet Take 1 tablet (0.5 mg total) by mouth daily.  Marland Kitchen ALPRAZolam (XANAX) 0.5 MG tablet Take 1 tablet (0.5 mg total) by mouth 3 (three) times daily as needed for anxiety.  Marland Kitchen apixaban (ELIQUIS) 2.5 MG TABS tablet Take 1 tablet (2.5 mg total) by mouth 2 (two) times daily.  Roseanne Kaufman Peru-Castor Oil (VENELEX) OINT Apply to sacrum and bilateral buttocks q shift and prn  . Dextromethorphan-Guaifenesin 10-100 MG/5ML liquid Take 15 mLs by mouth every 6 (six) hours as needed.  . divalproex (DEPAKOTE SPRINKLE) 125 MG capsule Give 3 capsules to = 375 mg by mouth twice a day  . divalproex (DEPAKOTE SPRINKLE) 125 MG capsule Give 2 capsules to = 250 mg by mouth at bedtime  . gabapentin (NEURONTIN) 100 MG capsule Take 1 capsule (100 mg total) by mouth 2 (two) times daily.  Marland Kitchen ipratropium-albuterol (DUONEB) 0.5-2.5 (3) MG/3ML SOLN Take 3 mLs by nebulization 4 (four) times daily as needed.  Marland Kitchen losartan (COZAAR) 50 MG tablet Take 1 tablet (50 mg total) by mouth daily.  . methimazole (TAPAZOLE) 5 MG tablet Take 1 tablet (5 mg total) by mouth daily.  . metoprolol 75 MG TABS Take 75 mg by mouth 2 (two) times daily.  . pantoprazole (PROTONIX) 40 MG tablet Take 40 mg by mouth daily.  . QUEtiapine (SEROQUEL) 25 MG tablet Take 50 mg by mouth daily.   . QUEtiapine (SEROQUEL) 50 MG tablet Take 50 mg by mouth at bedtime.  . senna (SENOKOT) 8.6 MG TABS tablet Take 1 tablet by mouth at bedtime.  . sertraline (ZOLOFT) 50 MG tablet Take 50 mg by mouth daily.  . [DISCONTINUED] divalproex (DEPAKOTE SPRINKLE) 125 MG capsule  Take 250 mg by mouth 3 (three) times daily.   . [DISCONTINUED] trimethoprim (TRIMPEX) 100 MG tablet Take 100 mg by mouth at bedtime.   No facility-administered encounter medications on file as of 05/04/2017.     Review of Systems   Unobtainable secondary to dementia  Immunization History  Administered Date(s) Administered  . Influenza-Unspecified 12/24/2016  . Pneumococcal Conjugate-13 12/24/2016  . Pneumococcal Polysaccharide-23 12/30/2015  . Tdap 12/24/2016   Pertinent  Health Maintenance Due  Topic Date Due  . OPHTHALMOLOGY EXAM  06/01/2017 (Originally 03/22/1934)  . DEXA SCAN  06/01/2017 (Originally 03/23/1991)  . HEMOGLOBIN A1C  09/27/2017  . URINE MICROALBUMIN  12/21/2017  . FOOT EXAM  01/31/2018  . INFLUENZA VACCINE  Completed  . PNA vac Low Risk Adult  Completed   Fall Risk  12/23/2016 12/20/2016  Falls in the past year? Yes Yes  Number falls in past yr: 2 or more 2 or more  Injury with Fall? Yes Yes  Risk Factor Category  High Fall Risk -   Functional Status Survey:    Vitals:   05/04/17 1432  BP: 112/76  Pulse: 84  Resp: 20  Temp: 98.2 F (36.8 C)  TempSrc: Oral  SpO2: 99%   There is no height or weight on file to calculate BMI. Physical Exam  In general this is a fairly well-developed elderly female in no distress lying comfortably in bed.  Her skin is warm and dry.  I she does appear to have what appears to be a stye in the medial margin of her left eye-her sclera and conjunctive are clear visual acuity appears to be intact I do not note any surrounding erythema in the orbital area  Oropharynx is clear mucous membranes moist.  Chest is clear to auscultation he has poor respiratory effort but I cannot appreciate any congestion or labored breathing.  Heart is irregular irregular rate and rhythm without murmur gallop or rub she does not have significant lower extremity edema.  Abdomen is soft nontender with positive bowel sounds.  Musculoskeletal  appears to be at baseline moves all extremities x4 difficult to fully assess since she is in bed and does not really follow verbal commands.  Neurologic appears to be grossly intact--cranial nerves appear to be intact she is alert but does not follow commands.  Psych findings consistent with dementia at times again she will have yelling episodes does not really follow verbal commands today which is not unusual  Labs reviewed: Recent Labs    03/03/17 1040 03/09/17 2000 03/11/17 0724  NA 140 142 140  K 3.9 4.1 3.9  CL 104 105 104  CO2 29 26 25   GLUCOSE 114* 119* 91  BUN 30* 29* 24*  CREATININE 0.89 1.00 0.86  CALCIUM 8.9 9.1 8.8*   Recent Labs    11/30/16 0702 03/03/17 1040 03/09/17 2000  AST 21 25 30   ALT 21 19 24   ALKPHOS 115 154* 143*  BILITOT 0.7 0.4 0.5  PROT 5.7* 5.9* 6.4*  ALBUMIN 3.2* 3.3* 3.5   Recent Labs    08/19/16 0700  03/03/17 1040 03/09/17 2000 03/11/17 0724  WBC 7.6   < > 6.2 11.3* 9.7  NEUTROABS 4.8  --   --  8.4* 6.6  HGB 13.5   < > 14.5 14.6 13.7  HCT 43.0   < > 46.4* 46.0 44.0  MCV 82.9   < > 88.5 87.6 87.5  PLT 138*   < > 187 226 222   < > = values in this interval not displayed.   Lab Results  Component Value Date   TSH 0.022 (L) 03/24/2017   Lab Results  Component Value Date   HGBA1C 5.8 (H) 03/30/2017   Lab Results  Component Value Date   CHOL 118 09/29/2016   HDL 31 (L) 09/29/2016   LDLCALC 73 09/29/2016   TRIG 68 09/29/2016   CHOLHDL 3.8 09/29/2016    Significant Diagnostic Results in last 30 days:  No results found.  Assessment/Plan  #1 left eye stye will treat with warm compresses twice daily until resolved if any changes or no resolution notify provider.  2.  History of dementia with behaviors-this continues to be a challenge at times at this  point continue Depakote 3 times daily dosing as well as Seroquel twice daily and Xanax routinely in the afternoon and as needed up to 3 times a day.  Will monitor as needed  again psychiatric services and Dr. have been following this-Gupta no further episodes of lethargy.  According to nursing at times early evening she will be somewhat sleepy but this is her natural behavior and usually later in the evening she becomes more active.  3.  Atrial fibrillation this appears rate controlled on Lopressor she is on Eliquis for anticoagulation.  [id number]

## 2017-05-07 IMAGING — DX DG PORTABLE PELVIS
2 series · 2 of 2 positions shown · non-contrast
Comparison: Preoperative radiographs obtained 11/23/2015

CLINICAL DATA: 89-year-old female status post right hip replacement

EXAM:
PORTABLE PELVIS 1-2 VIEWS

[pelvis ap (1 of 2)]
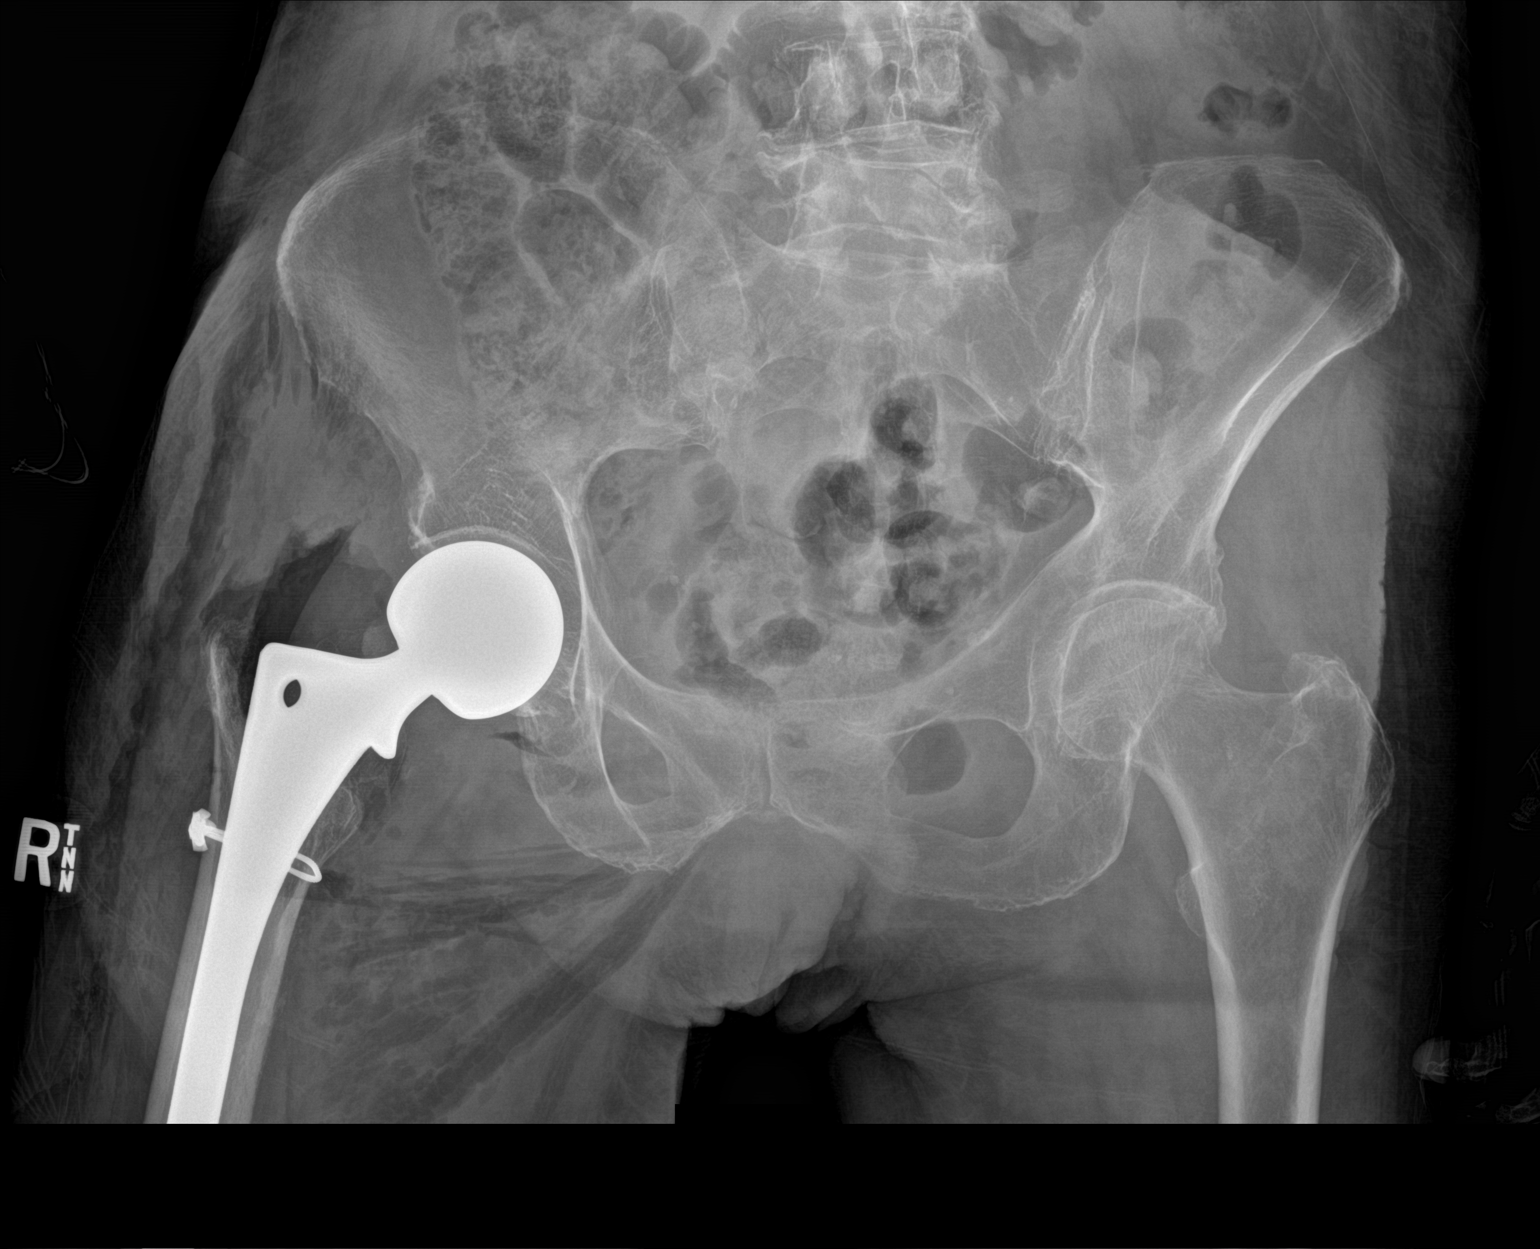

[pelvis ap (2 of 2)]
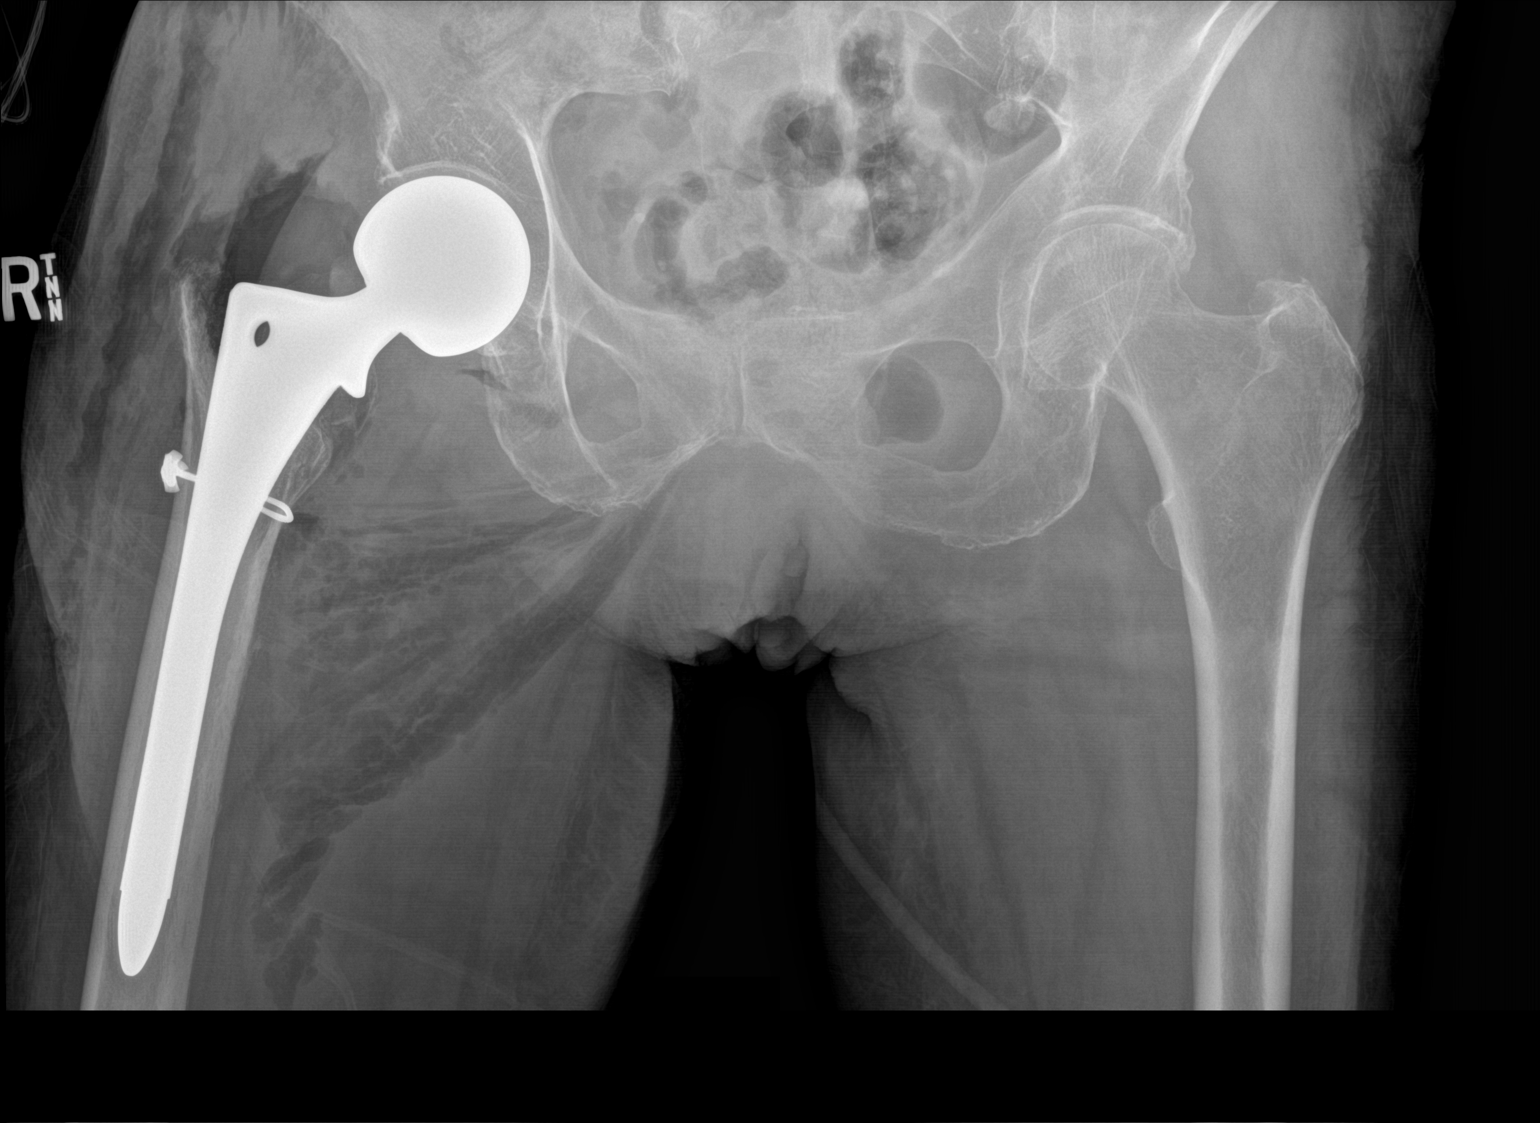

[2 of 2 positions shown; findings below may reference images not displayed]

FINDINGS: Interval surgical changes of right total hip arthroplasty. A single
cerclage wire is present surrounding the proximal femoral
metaphysis. No evidence of immediate hardware complication.
Extensive subcutaneous emphysema consistent with recent surgical
intervention. The bones appear osteopenic.
IMPRESSION: Status post right hip arthroplasty with expected postsurgical
changes.

## 2017-05-10 ENCOUNTER — Encounter: Payer: Self-pay | Admitting: Internal Medicine

## 2017-05-10 ENCOUNTER — Non-Acute Institutional Stay (SKILLED_NURSING_FACILITY): Payer: Medicare Other | Admitting: Internal Medicine

## 2017-05-10 DIAGNOSIS — I63312 Cerebral infarction due to thrombosis of left middle cerebral artery: Secondary | ICD-10-CM

## 2017-05-10 DIAGNOSIS — I10 Essential (primary) hypertension: Secondary | ICD-10-CM | POA: Diagnosis not present

## 2017-05-10 DIAGNOSIS — E0590 Thyrotoxicosis, unspecified without thyrotoxic crisis or storm: Secondary | ICD-10-CM

## 2017-05-10 DIAGNOSIS — I482 Chronic atrial fibrillation, unspecified: Secondary | ICD-10-CM

## 2017-05-10 DIAGNOSIS — F0391 Unspecified dementia with behavioral disturbance: Secondary | ICD-10-CM | POA: Diagnosis not present

## 2017-05-10 DIAGNOSIS — F0392 Unspecified dementia, unspecified severity, with psychotic disturbance: Secondary | ICD-10-CM

## 2017-05-10 NOTE — Progress Notes (Addendum)
Location:   [facility] Room Number: 102/P Place of Service:  SNF (31) Provider:  Eliot Ford, MD  Patient Care Team: Virgie Dad, MD as PCP - General (Internal Medicine) Neldon Mc, MD (General Surgery) Jonnie Kind, MD as Consulting Physician (Obstetrics and Gynecology) Rolm Baptise as Physician Assistant (Internal Medicine)  Extended Emergency Contact Information Primary Emergency Contact: Toms,Denise          [place], [place] [address] Johnnette Litter of [place] Phone: [phone] Mobile Phone: [phone] Relation: Relative Secondary Emergency Contact: [place] Crane Address: [address]          [place],  [address] Johnnette Litter of [place] Phone: [phone] Relation: Son   Code Status:  DNR Goals of care: Advanced Directive information Advanced Directives 05/10/2017  Does Patient Have a Medical Advance Directive? -  Type of Advance Directive Out of facility DNR (pink MOST or yellow form)  Does patient want to make changes to medical advance directive? No - Patient declined  Copy of [place] in Chart? -  Would patient like information on creating a medical advance directive? -  Pre-existing out of facility DNR order (yellow form or pink MOST form) -     Chief Complaint  Patient presents with  . Medical Management of Chronic Issues    Routine Visit  . Acute Visit    Patients c/o Left hand Edema    HPI:  Pt is a 84 y.o. female seen today for medical management of chronic diseases.   Patient is Long term resident of the facility. She has history ofCVA in 2016 , Chronic Atrial fibrillation, Hypertension, recurrent UTI , recurrent Falls , S/p Hip Fracture , GI bleed, CKD,recent Diagnosis of Hyperthyroidismand Dementia with behavior Issues. Patient main problem continues to be agitation and chronic Yelling. Patient has been started on Depakote, Seroquel and Xanax PRN. She continues  to yell. She is followed by Psychiatrist. Her condition has been d/w her niece and she has decided to not pursue any aggressive measures and keep her Comfortable. Patient has lost almost 10 lbs in Past few months. She does have 24 hour sitter . They try to feed her . Nurse had also noticed some swelling in her Left hand. Though patient has not had any falls and has not c/o and pain in that hand. Unable to get any history from Patient         Past Medical History:  Diagnosis Date  . Arthritis   . Cancer ([place])    Rt Breast  . Coronary artery disease   . Cyst of breast    Right - at 78 yrs of age.  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 Diabetes mellitus without complication ([place])   . Diverticulitis   . Elevated cholesterol   . GERD (gastroesophageal reflux disease)   . Hardening of the arteries of the brain   . Heart disease   . Herniated disc   . Hx Breast cancer, DCIS, Right 08/19/2010  . Hypertension   . Migraine headache   . SBO (small bowel obstruction) ([place]) 09/02/11  . Shingles   . Stroke [facility]) 2004   cerebral hemmorhage   Past Surgical History:  Procedure Laterality Date  . BRAIN SURGERY     hemorrhage  . BREAST SURGERY  09/25/2010   mastectomy  . CYSTECTOMY    . ESOPHAGOGASTRODUODENOSCOPY N/A 11/13/2015  Procedure: ESOPHAGOGASTRODUODENOSCOPY (EGD);  Surgeon: Rogene Houston, MD;  [facility];  Service: Endoscopy;  Laterality: N/A;  . fibroid tumors    . KNEE ARTHROSCOPY Right    right knee x 3  . LAPAROSCOPIC BILATERAL SALPINGO OOPHERECTOMY    . SPINE SURGERY     repair herniated disc  . TOTAL HIP ARTHROPLASTY Right 11/26/2015   Procedure: HEMI HIP ARTHROPLASTY ANTERIOR APPROACH;  Surgeon: Rod Can, MD;  Location: [facility];  Service: Orthopedics;  Laterality: Right;    Allergies  Allergen Reactions  . Tape Rash  . Ciprofloxacin   . Sulfa Antibiotics Hives  . Codeine Nausea Only    nausea.  .  Morphine And Related Nausea Only    Nausea only.    Outpatient Encounter Medications as of 05/10/2017  Medication Sig  . acetaminophen (TYLENOL) 325 MG tablet Take 650 mg by mouth every 6 (six) hours as needed.  . ALPRAZolam (XANAX) 0.5 MG tablet Take 1 tablet (0.5 mg total) by mouth daily.  Marland Kitchen ALPRAZolam (XANAX) 0.5 MG tablet Take 1 tablet (0.5 mg total) by mouth 3 (three) times daily as needed for anxiety.  Marland Kitchen apixaban (ELIQUIS) 2.5 MG TABS tablet Take 1 tablet (2.5 mg total) by mouth 2 (two) times daily.  Roseanne Kaufman [place]-Castor Oil (VENELEX) OINT Apply to sacrum and bilateral buttocks q shift and prn  . Dextromethorphan-Guaifenesin 10-100 MG/5ML liquid Take 15 mLs by mouth every 6 (six) hours as needed.  . divalproex (DEPAKOTE SPRINKLE) 125 MG capsule Give 3 capsules to = 375 mg by mouth twice a day  . divalproex (DEPAKOTE SPRINKLE) 125 MG capsule Give 2 capsules to = 250 mg by mouth at bedtime  . gabapentin (NEURONTIN) 100 MG capsule Take 1 capsule (100 mg total) by mouth 2 (two) times daily.  Marland Kitchen ipratropium-albuterol (DUONEB) 0.5-2.5 (3) MG/3ML SOLN Take 3 mLs by nebulization 4 (four) times daily as needed.  Marland Kitchen losartan (COZAAR) 50 MG tablet Take 1 tablet (50 mg total) by mouth daily.  . methimazole (TAPAZOLE) 5 MG tablet Take 1 tablet (5 mg total) by mouth daily.  . metoprolol 75 MG TABS Take 75 mg by mouth 2 (two) times daily.  . pantoprazole (PROTONIX) 40 MG tablet Take 40 mg by mouth daily.  . QUEtiapine (SEROQUEL) 25 MG tablet Take 50 mg by mouth daily.   . QUEtiapine (SEROQUEL) 50 MG tablet Take 50 mg by mouth at bedtime.  . senna (SENOKOT) 8.6 MG TABS tablet Take 1 tablet by mouth at bedtime.  . sertraline (ZOLOFT) 50 MG tablet Take 50 mg by mouth daily.   No facility-administered encounter medications on file as of 05/10/2017.      Review of Systems  Unable to perform ROS: Dementia    Immunization History  Administered Date(s) Administered  . Influenza-Unspecified 12/24/2016    . Pneumococcal Conjugate-13 12/24/2016  . Pneumococcal Polysaccharide-23 12/30/2015  . Tdap 12/24/2016   Pertinent  Health Maintenance Due  Topic Date Due  . OPHTHALMOLOGY EXAM  06/01/2017 (Originally 03/22/1934)  . DEXA SCAN  06/01/2017 (Originally 03/23/1991)  . HEMOGLOBIN A1C  09/27/2017  . URINE MICROALBUMIN  12/21/2017  . FOOT EXAM  01/31/2018  . INFLUENZA VACCINE  Completed  . PNA vac Low Risk Adult  Completed   Fall Risk  12/23/2016 12/20/2016  Falls in the past year? Yes Yes  Number falls in past yr: 2 or more 2 or more  Injury with Fall? Yes Yes  Risk Factor Category  High Fall Risk -  Functional Status Survey:    Vitals:   05/10/17 1159  BP: 114/77  Pulse: 76  Resp: 20  Temp: 98.8 F (37.1 C)  TempSrc: Oral  SpO2: 94%  Weight: 113 lb 6.4 oz (51.4 kg)  Height: 5' (1.524 m)   Body mass index is 22.15 kg/m. Physical Exam  Constitutional: She appears well-developed and well-nourished.  HENT:  Head: Normocephalic.  Mouth/Throat: Oropharynx is clear and moist.  Eyes: Pupils are equal, round, and reactive to light.  Neck: Neck supple.  Cardiovascular: Normal rate and normal heart sounds.  No murmur heard. Pulmonary/Chest: Effort normal and breath sounds normal. No respiratory distress. She has no wheezes. She has no rales.  Abdominal: Soft. Bowel sounds are normal. She exhibits no distension. There is no tenderness. There is no rebound.  Musculoskeletal:  Mild edema in Left hand. No tenderness or redness.  Lymphadenopathy:    She has no cervical adenopathy.  Neurological: She is alert.  Today she is not following much commands and was sleepy as she just got Xanax to calm her from Yelling.  Skin: Skin is warm and dry.  Psychiatric: She has a normal mood and affect.    Labs reviewed: Recent Labs    03/03/17 1040 03/09/17 2000 03/11/17 0724  NA 140 142 140  K 3.9 4.1 3.9  CL 104 105 104  CO2 29 26 25   GLUCOSE 114* 119* 91  BUN 30* 29* 24*  CREATININE  0.89 1.00 0.86  CALCIUM 8.9 9.1 8.8*   Recent Labs    11/30/16 0702 03/03/17 1040 03/09/17 2000  AST 21 25 30   ALT 21 19 24   ALKPHOS 115 154* 143*  BILITOT 0.7 0.4 0.5  PROT 5.7* 5.9* 6.4*  ALBUMIN 3.2* 3.3* 3.5   Recent Labs    08/19/16 0700  03/03/17 1040 03/09/17 2000 03/11/17 0724  WBC 7.6   < > 6.2 11.3* 9.7  NEUTROABS 4.8  --   --  8.4* 6.6  HGB 13.5   < > 14.5 14.6 13.7  HCT 43.0   < > 46.4* 46.0 44.0  MCV 82.9   < > 88.5 87.6 87.5  PLT 138*   < > 187 226 222   < > = values in this interval not displayed.   Lab Results  Component Value Date   TSH 0.022 (L) 03/24/2017   Lab Results  Component Value Date   HGBA1C 5.8 (H) 03/30/2017   Lab Results  Component Value Date   CHOL 118 09/29/2016   HDL 31 (L) 09/29/2016   LDLCALC 73 09/29/2016   TRIG 68 09/29/2016   CHOLHDL 3.8 09/29/2016    Significant Diagnostic Results in last 30 days:  No results found.  Assessment/Plan Chronic atrial fibrillation She is on low-dose Eliquis and her rate is controlled on metoprolol  Hypertension Blood pressure is controlled on losartan and Metoprolol  Status post CVA Thought to be due to A Fib She has been stable on Eliquis. Left hand Swelling No signs of any injury.Or infection Will keep it elevated. Follow. Xray if does not get better.  Hypothyroidism On methimazole.  Follows with endocrinology Dementia with behavioral issues I had a long discussion about her with her Niece before who has power of attorney. We both agreed that patient has worsened over the past few months.  At this stage she is not interested in any kind of aggressive treatment or test. She just wants her to be comfortable. Will make her Xanax routine BID instead of  PRN. Continue on Zoloft. And Seroquel.  Cannot increase her Depakote as patient is now loosing weight and getting Lethargic easily. Depakote level is WNL.    Family/ staff Communication:   Labs/tests ordered:   Total  time spent in this patient care encounter was 25_ minutes; greater than 50% of the visit spent , reviewing records , Labs and coordinating care for problems addressed at this encounter.

## 2017-05-14 ENCOUNTER — Encounter: Payer: Self-pay | Admitting: Internal Medicine

## 2017-05-20 DEATH — deceased

## 2017-05-24 NOTE — Progress Notes (Signed)
This encounter was created in error - please disregard.  This encounter was created in error - please disregard.
